# Patient Record
Sex: Female | Born: 1970
Health system: Southern US, Community
[De-identification: ages and names within clinical notes are randomized; demographics above are authoritative.]

## PROBLEM LIST (undated history)

## (undated) DIAGNOSIS — F988 Other specified behavioral and emotional disorders with onset usually occurring in childhood and adolescence: Secondary | ICD-10-CM

## (undated) DIAGNOSIS — E785 Hyperlipidemia, unspecified: Secondary | ICD-10-CM

## (undated) DIAGNOSIS — R519 Headache, unspecified: Secondary | ICD-10-CM

## (undated) DIAGNOSIS — K648 Other hemorrhoids: Secondary | ICD-10-CM

## (undated) DIAGNOSIS — K760 Fatty (change of) liver, not elsewhere classified: Secondary | ICD-10-CM

## (undated) DIAGNOSIS — F329 Major depressive disorder, single episode, unspecified: Secondary | ICD-10-CM

## (undated) DIAGNOSIS — R609 Edema, unspecified: Secondary | ICD-10-CM

## (undated) DIAGNOSIS — E559 Vitamin D deficiency, unspecified: Secondary | ICD-10-CM

## (undated) DIAGNOSIS — D259 Leiomyoma of uterus, unspecified: Secondary | ICD-10-CM

## (undated) DIAGNOSIS — I10 Essential (primary) hypertension: Secondary | ICD-10-CM

## (undated) DIAGNOSIS — R7989 Other specified abnormal findings of blood chemistry: Secondary | ICD-10-CM

## (undated) DIAGNOSIS — G8929 Other chronic pain: Secondary | ICD-10-CM

## (undated) DIAGNOSIS — J189 Pneumonia, unspecified organism: Secondary | ICD-10-CM

## (undated) DIAGNOSIS — N920 Excessive and frequent menstruation with regular cycle: Secondary | ICD-10-CM

## (undated) DIAGNOSIS — E041 Nontoxic single thyroid nodule: Secondary | ICD-10-CM

## (undated) DIAGNOSIS — F32A Depression, unspecified: Secondary | ICD-10-CM

## (undated) DIAGNOSIS — K219 Gastro-esophageal reflux disease without esophagitis: Secondary | ICD-10-CM

## (undated) DIAGNOSIS — F419 Anxiety disorder, unspecified: Secondary | ICD-10-CM

## (undated) DIAGNOSIS — E039 Hypothyroidism, unspecified: Secondary | ICD-10-CM

## (undated) DIAGNOSIS — M1990 Unspecified osteoarthritis, unspecified site: Secondary | ICD-10-CM

## (undated) DIAGNOSIS — R011 Cardiac murmur, unspecified: Secondary | ICD-10-CM

## (undated) DIAGNOSIS — N939 Abnormal uterine and vaginal bleeding, unspecified: Secondary | ICD-10-CM

## (undated) DIAGNOSIS — G4730 Sleep apnea, unspecified: Secondary | ICD-10-CM

## (undated) DIAGNOSIS — Z9289 Personal history of other medical treatment: Secondary | ICD-10-CM

## (undated) DIAGNOSIS — R51 Headache: Secondary | ICD-10-CM

## (undated) DIAGNOSIS — D509 Iron deficiency anemia, unspecified: Secondary | ICD-10-CM

## (undated) DIAGNOSIS — M2550 Pain in unspecified joint: Secondary | ICD-10-CM

## (undated) DIAGNOSIS — K222 Esophageal obstruction: Secondary | ICD-10-CM

## (undated) DIAGNOSIS — U071 COVID-19: Secondary | ICD-10-CM

## (undated) DIAGNOSIS — F418 Other specified anxiety disorders: Secondary | ICD-10-CM

## (undated) HISTORY — DX: Other specified anxiety disorders: F41.8

## (undated) HISTORY — PX: WISDOM TOOTH EXTRACTION: SHX21

## (undated) HISTORY — DX: Iron deficiency anemia, unspecified: D50.9

## (undated) HISTORY — DX: Depression, unspecified: F32.A

## (undated) HISTORY — DX: Cardiac murmur, unspecified: R01.1

## (undated) HISTORY — DX: Other specified abnormal findings of blood chemistry: R79.89

## (undated) HISTORY — DX: Leiomyoma of uterus, unspecified: D25.9

## (undated) HISTORY — DX: Unspecified osteoarthritis, unspecified site: M19.90

## (undated) HISTORY — DX: Essential (primary) hypertension: I10

## (undated) HISTORY — DX: Headache: R51

## (undated) HISTORY — DX: Anxiety disorder, unspecified: F41.9

## (undated) HISTORY — DX: Hyperlipidemia, unspecified: E78.5

## (undated) HISTORY — DX: Sleep apnea, unspecified: G47.30

## (undated) HISTORY — DX: Headache, unspecified: R51.9

## (undated) HISTORY — DX: Other hemorrhoids: K64.8

## (undated) HISTORY — DX: Other specified behavioral and emotional disorders with onset usually occurring in childhood and adolescence: F98.8

## (undated) HISTORY — DX: Excessive and frequent menstruation with regular cycle: N92.0

## (undated) HISTORY — DX: Abnormal uterine and vaginal bleeding, unspecified: N93.9

## (undated) HISTORY — DX: Edema, unspecified: R60.9

## (undated) HISTORY — DX: Esophageal obstruction: K22.2

## (undated) HISTORY — DX: Gastro-esophageal reflux disease without esophagitis: K21.9

## (undated) HISTORY — DX: Vitamin D deficiency, unspecified: E55.9

## (undated) HISTORY — DX: Pain in unspecified joint: M25.50

## (undated) HISTORY — DX: Major depressive disorder, single episode, unspecified: F32.9

## (undated) HISTORY — PX: JOINT REPLACEMENT: SHX530

## (undated) HISTORY — DX: Hypothyroidism, unspecified: E03.9

## (undated) HISTORY — PX: TUBAL LIGATION: SHX77

## (undated) HISTORY — DX: Nontoxic single thyroid nodule: E04.1

## (undated) HISTORY — DX: Fatty (change of) liver, not elsewhere classified: K76.0

## (undated) HISTORY — DX: Other chronic pain: G89.29

---

## 1999-02-13 ENCOUNTER — Ambulatory Visit (HOSPITAL_COMMUNITY): Admission: RE | Admit: 1999-02-13 | Discharge: 1999-02-13 | Payer: Self-pay | Admitting: *Deleted

## 2006-03-04 ENCOUNTER — Encounter: Admission: RE | Admit: 2006-03-04 | Discharge: 2006-03-04 | Payer: Self-pay | Admitting: Internal Medicine

## 2010-11-04 ENCOUNTER — Encounter: Admission: RE | Admit: 2010-11-04 | Discharge: 2010-11-04 | Payer: Self-pay | Admitting: Family Medicine

## 2011-12-21 ENCOUNTER — Other Ambulatory Visit: Payer: Self-pay | Admitting: Family Medicine

## 2011-12-21 DIAGNOSIS — Z1231 Encounter for screening mammogram for malignant neoplasm of breast: Secondary | ICD-10-CM

## 2011-12-30 ENCOUNTER — Ambulatory Visit (INDEPENDENT_AMBULATORY_CARE_PROVIDER_SITE_OTHER): Payer: 59 | Admitting: Medical

## 2011-12-30 ENCOUNTER — Encounter: Payer: Self-pay | Admitting: Medical

## 2011-12-30 VITALS — BP 132/80 | HR 78 | Temp 98.3°F | Resp 16 | Ht 66.0 in | Wt 182.0 lb

## 2011-12-30 DIAGNOSIS — M79609 Pain in unspecified limb: Secondary | ICD-10-CM | POA: Insufficient documentation

## 2011-12-30 DIAGNOSIS — F341 Dysthymic disorder: Secondary | ICD-10-CM

## 2011-12-30 DIAGNOSIS — K117 Disturbances of salivary secretion: Secondary | ICD-10-CM

## 2011-12-30 DIAGNOSIS — F418 Other specified anxiety disorders: Secondary | ICD-10-CM

## 2011-12-30 DIAGNOSIS — H04123 Dry eye syndrome of bilateral lacrimal glands: Secondary | ICD-10-CM | POA: Insufficient documentation

## 2011-12-30 DIAGNOSIS — R682 Dry mouth, unspecified: Secondary | ICD-10-CM | POA: Insufficient documentation

## 2011-12-30 DIAGNOSIS — E039 Hypothyroidism, unspecified: Secondary | ICD-10-CM

## 2011-12-30 DIAGNOSIS — H04129 Dry eye syndrome of unspecified lacrimal gland: Secondary | ICD-10-CM

## 2011-12-30 HISTORY — DX: Other specified anxiety disorders: F41.8

## 2011-12-30 MED ORDER — LEVOTHYROXINE SODIUM 175 MCG PO TABS: 175.0000 ug | ORAL_TABLET | Freq: Every day | ORAL | Status: DC

## 2011-12-30 MED ORDER — BUPROPION HCL ER (XL) 300 MG PO TB24: 300.0000 mg | ORAL_TABLET | Freq: Every day | ORAL | Status: DC

## 2011-12-30 NOTE — Patient Instructions (Signed)
Return or call in 2 weeks.

## 2011-12-30 NOTE — Progress Notes (Signed)
Subjective:   HPI  [name] is a 41 y.o. female who presents as a new patient today. She was formally seen [facility] in [place], [place].    She is here to establish care. She needs refills on her thyroid and depression medication, both of which she has been on for a while now and does well these medications. She has a history of ADD, was on Adderall at [facility] prior, but took herself off of these as she did not like the way they made her feel.    Her last routine care includes physical March 2012, mammogram 2011 Pap smear 2011, she is up-to-date on flu vaccine, is exercising 7 days a week 45 minutes per day with walking. She has 2 prior pregnancies with childbirth in 1994 and 1997.    Her other main concern today is dry mouth, dry eyes, and pains in her arms. She saw Dr. Smith Mince in December at the [facility] clinic for these complaints, had a panel of labs done which apparently were normal. Her dentist had commented about the possibility of Sjogren's, and this is what they were trying to rule out with labs.  She has had problems with dry mouth and dry eyes since Thanksgiving 2012. She has had intermittent aches in her arms bilaterally for a few months now. Her arms go numb at night, but not a problem during the day.  She denies morning stiffness. She does feel like her hands and fingers swell at times. Her mouth has a dryness and burning sensation at times.  She denies vaginal dryness.  She does report fatigue. Normally has pains in her shoulders, wrists and fingers. She does note a history of neck pain, was [facility] in the past, and does remember mention of stenosis.  She has a cousin with a history of Sjogren's and rheumatoid arthritis.  No other aggravating or relieving factors.    No other c/o.  The following portions of the patient's history were reviewed and updated as appropriate: allergies, current medications, past family history, past medical history,  past social history, past surgical history and problem list.  Past Medical History  Diagnosis Date  . Edema   . Hypothyroidism   . Allergy   . History of pneumonia   . Iron deficiency anemia   . Chronic headache   . Depression   . Anxiety   . Joint pain   . ADD (attention deficit disorder)    Review of Systems Constitutional: -fever, -chills, -sweats, -unexpected -weight change, +fatigue ENT: -runny nose, -ear pain, -sore throat Cardiology:  -chest pain, -palpitations, -edema Respiratory: -cough, -shortness of breath, -wheezing Gastroenterology: -abdominal pain, -nausea, -vomiting, -diarrhea, -constipation  Hematology: -bleeding or bruising problems Ophthalmology: -vision changes Urology: -dysuria, -difficulty urinating, -hematuria, -urinary frequency, -urgency Neurology: -headache, -weakness, -tingling   Objective:   Physical Exam  Filed Vitals:   12/30/11 1556  BP: 132/80  Pulse: 78  Temp: 98.3 F (36.8 C)  Resp: 16    General appearance: alert, no distress, WD/WN, white female Skin no rash, no tightening of the skin HEENT: normocephalic, sclerae anicteric, TMs pearly, nares patent, no discharge or erythema, pharynx normal Oral cavity: MMM, no lesions Neck: supple, no lymphadenopathy, no thyromegaly, no masses Heart: RRR, normal S1, S2, no murmurs Lungs: CTA bilaterally, no wheezes, rhonchi, or rales Pulses: 2+ symmetric, upper and lower extremities, normal cap refill Musculoskeletal upper extremities non-tender, no bony abnormality, no swelling Neuro: Mild positive Phalen's, negative Tinel's, otherwise  upper extremities with normal strength and sensation, normal DTRs  Assessment and Plan :    Encounter Diagnoses  Name Primary?  . Dry mouth Yes  . Dry eyes   . Pain in limb   . Depression with anxiety   . Hypothyroidism    Dry mouth and dry eyes-she will sign for records release so we can obtain prior labs and records from [facility]  Pain in  limb-advise she roll her arms in a towel to keep her arms relatively straight at night, sleep flat on her back, with pillows supporting arms, and less see if this helps the numbness.  Depression and anxiety-refill her Wellbutrin  Hypothyroidism-refer her thyroid medication  Follow-up 2 weeks pending records and labs

## 2012-01-06 ENCOUNTER — Ambulatory Visit
Admission: RE | Admit: 2012-01-06 | Discharge: 2012-01-06 | Disposition: A | Discharge status: Home / Self Care | Payer: 59 | Source: Ambulatory Visit | Attending: Family Medicine | Admitting: Family Medicine

## 2012-01-06 DIAGNOSIS — Z1231 Encounter for screening mammogram for malignant neoplasm of breast: Secondary | ICD-10-CM

## 2012-02-18 ENCOUNTER — Ambulatory Visit (INDEPENDENT_AMBULATORY_CARE_PROVIDER_SITE_OTHER): Payer: 59 | Admitting: Medical

## 2012-02-18 ENCOUNTER — Encounter: Payer: Self-pay | Admitting: Medical

## 2012-02-18 VITALS — BP 132/82 | HR 76 | Temp 98.4°F | Resp 16 | Wt 183.0 lb

## 2012-02-18 DIAGNOSIS — F329 Major depressive disorder, single episode, unspecified: Secondary | ICD-10-CM | POA: Insufficient documentation

## 2012-02-18 DIAGNOSIS — F988 Other specified behavioral and emotional disorders with onset usually occurring in childhood and adolescence: Secondary | ICD-10-CM

## 2012-02-18 DIAGNOSIS — F32A Depression, unspecified: Secondary | ICD-10-CM | POA: Insufficient documentation

## 2012-02-18 MED ORDER — AMPHETAMINE-DEXTROAMPHET ER 20 MG PO CP24: 20.0000 mg | ORAL_CAPSULE | ORAL | Status: DC
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
## 2012-02-18 NOTE — Patient Instructions (Signed)

## 2012-02-29 ENCOUNTER — Telehealth: Payer: Self-pay | Admitting: Medical

## 2012-03-03 ENCOUNTER — Telehealth: Payer: Self-pay | Admitting: Medical

## 2012-03-03 ENCOUNTER — Telehealth: Payer: Self-pay | Admitting: Family Medicine

## 2012-03-03 NOTE — Telephone Encounter (Signed)
DONE

## 2012-03-04 ENCOUNTER — Telehealth: Payer: Self-pay | Admitting: Family Medicine

## 2012-03-04 ENCOUNTER — Encounter: Payer: Self-pay | Admitting: Family Medicine

## 2012-03-04 NOTE — Telephone Encounter (Signed)
Pt called she needs a letter stating she is on Adderall and it may increase her blood pressure.  She needs this for her Bio Metric screening to save her 40.00 per month.  Will send copy to [facility] in [place] and cc pt. Per pt

## 2012-03-18 ENCOUNTER — Encounter: Payer: Self-pay | Admitting: Internal Medicine

## 2012-03-23 ENCOUNTER — Ambulatory Visit (INDEPENDENT_AMBULATORY_CARE_PROVIDER_SITE_OTHER): Payer: 59 | Admitting: Medical

## 2012-03-23 ENCOUNTER — Encounter: Payer: Self-pay | Admitting: Medical

## 2012-03-23 VITALS — BP 140/80 | HR 80 | Temp 98.4°F | Resp 16 | Wt 181.0 lb

## 2012-03-23 DIAGNOSIS — F329 Major depressive disorder, single episode, unspecified: Secondary | ICD-10-CM

## 2012-03-23 DIAGNOSIS — F988 Other specified behavioral and emotional disorders with onset usually occurring in childhood and adolescence: Secondary | ICD-10-CM

## 2012-03-23 MED ORDER — CITALOPRAM HYDROBROMIDE 20 MG PO TABS: ORAL_TABLET | ORAL | Status: DC

## 2012-03-23 MED ORDER — AMPHETAMINE-DEXTROAMPHET ER 20 MG PO CP24: 20.0000 mg | ORAL_CAPSULE | ORAL | Status: DC
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
## 2012-05-05 ENCOUNTER — Other Ambulatory Visit: Payer: Self-pay | Admitting: Medical

## 2012-05-05 ENCOUNTER — Telehealth: Payer: Self-pay | Admitting: Medical

## 2012-05-05 MED ORDER — AMPHETAMINE-DEXTROAMPHET ER 20 MG PO CP24: 20.0000 mg | ORAL_CAPSULE | ORAL | Status: DC

## 2012-05-05 NOTE — Telephone Encounter (Signed)
rx ready 

## 2012-05-25 ENCOUNTER — Encounter: Payer: Self-pay | Admitting: Medical

## 2012-05-25 ENCOUNTER — Ambulatory Visit (INDEPENDENT_AMBULATORY_CARE_PROVIDER_SITE_OTHER): Payer: 59 | Admitting: Medical

## 2012-05-25 VITALS — BP 126/82 | HR 72 | Temp 98.1°F | Resp 16 | Wt 181.0 lb

## 2012-05-25 DIAGNOSIS — M542 Cervicalgia: Secondary | ICD-10-CM

## 2012-05-25 DIAGNOSIS — M5412 Radiculopathy, cervical region: Secondary | ICD-10-CM

## 2012-05-25 MED ORDER — HYDROCODONE-ACETAMINOPHEN 5-500 MG PO TABS: 1.0000 | ORAL_TABLET | Freq: Four times a day (QID) | ORAL | Status: AC | PRN

## 2012-05-25 MED ORDER — METHYLPREDNISOLONE SODIUM SUCC 125 MG IJ SOLR
125.0000 mg | Freq: Once | INTRAMUSCULAR | Status: AC
  Administered 2012-05-25: 125 mg via INTRAMUSCULAR

## 2012-05-25 MED ORDER — METHYLPREDNISOLONE SODIUM SUCC 125 MG IJ SOLR: 125.0000 mg | Freq: Once | INTRAMUSCULAR | Status: DC

## 2012-05-25 MED ORDER — METAXALONE 800 MG PO TABS: 800.0000 mg | ORAL_TABLET | Freq: Three times a day (TID) | ORAL | Status: AC

## 2012-05-25 NOTE — Progress Notes (Signed)
Subjective: Here for c/o neck pain.  She reports neck pain since age 41yo.  Gets flare ups of neck pain intermittent.  This past Saturday had really bad flare up, still has neck pain and headache now almost 5 days later.  Bending neck worsens the pain.  Using Advil 800mg  OTC, muscle relaxer prn.  She was told in the remote past that she has a pinched nerve. Has seen chiropractic before.  [facility].  No prior MRI or EDSI.  She notes pain in neck posteriorly, pain usually radiates to left upper back and shoulder, down the left arm sometimes.  Denies numbness, tingling, weakness.  Denies back pain.  She notes that she gets spasms in left upper back at times too that seems different than the neck pain.    Past Medical History  Diagnosis Date  . Edema   . Hypothyroidism   . Allergy   . History of pneumonia   . Iron deficiency anemia   . Chronic headache   . Depression   . Anxiety   . Joint pain   . ADD (attention deficit disorder)    Objective: Gen: WD, WN, NAD Skin: no erythema or ecchymosis Neck: supple, nontender, but ROM causes pain, no mass or thyromegaly MSK: nontender UE, back, normal ROM of UE, no obvious deformity Neuro: normal UE strength, sensation, and DTRs Pulses UE normal  Assessment: Encounter Diagnoses  Name Primary?  . Neck pain Yes  . Cervical radicular pain     Plan:  reviewed prior spine center notes. She seems to have both upper back/neck spasm as well as radicular symptoms of cervical spine.  Solumedrol IM today.  Script for Hydrocodone prn, Flexeril, and can use OTC NSAID prn.  Discussed home exercise program, stretching, and if pain continues to worsen or other new radicular symptoms, consider MRI and/or referral back to spine center.

## 2012-05-26 ENCOUNTER — Encounter: Payer: Self-pay | Admitting: Medical

## 2012-09-07 ENCOUNTER — Ambulatory Visit (INDEPENDENT_AMBULATORY_CARE_PROVIDER_SITE_OTHER): Payer: 59 | Admitting: Medical

## 2012-09-07 ENCOUNTER — Encounter: Payer: Self-pay | Admitting: Medical

## 2012-09-07 VITALS — BP 120/80 | Temp 98.6°F | Wt 176.0 lb

## 2012-09-07 DIAGNOSIS — E039 Hypothyroidism, unspecified: Secondary | ICD-10-CM

## 2012-09-07 DIAGNOSIS — F418 Other specified anxiety disorders: Secondary | ICD-10-CM

## 2012-09-07 DIAGNOSIS — F341 Dysthymic disorder: Secondary | ICD-10-CM

## 2012-09-07 DIAGNOSIS — F988 Other specified behavioral and emotional disorders with onset usually occurring in childhood and adolescence: Secondary | ICD-10-CM

## 2012-09-07 DIAGNOSIS — E041 Nontoxic single thyroid nodule: Secondary | ICD-10-CM

## 2012-09-07 MED ORDER — AMPHETAMINE-DEXTROAMPHET ER 20 MG PO CP24: 20.0000 mg | ORAL_CAPSULE | ORAL | Status: DC

## 2012-09-07 MED ORDER — BUPROPION HCL ER (XL) 300 MG PO TB24: 300.0000 mg | ORAL_TABLET | Freq: Every day | ORAL | Status: DC

## 2012-09-07 NOTE — Progress Notes (Signed)
  Subjective:   HPI  [name] is a 41 y.o. female who presents for routine f/u on hypothyroidism, ADD, and depression.  Been on synthroid without c/o.    Uses Adderral XR 20mg  daily for focus and help with attention.  No c/o.    Since last visit has had improvement in social situation.  receiving child support from ex husband now.  Her youngest is living with her full time and in new school which is an improvement.  She isn't working part time at [employer] since the season is open.    No other c/o.  The following portions of the patient's history were reviewed and updated as appropriate: allergies, current medications, past family history, past medical history, past social history, past surgical history and problem list.  Past Medical History  Diagnosis Date  . Edema   . Hypothyroidism   . Allergy   . History of pneumonia   . Iron deficiency anemia   . Chronic headache   . Depression   . Anxiety   . Joint pain   . ADD (attention deficit disorder)     No Known Allergies   Review of Systems ROS reviewed and was negative other than noted in HPI or above.    Objective:   Physical Exam  General appearance: alert, no distress, WD/WN Neck: supple, no lymphadenopathy, left lower thyroid with 1cm round nontender nodule, otherwise no thyromegaly Heart: RRR, normal S1, S2, no murmurs Lungs: CTA bilaterally, no wheezes, rhonchi, or rales Pulses: 2+ symmetric, upper and lower extremities, normal cap refill   Assessment and Plan :     Encounter Diagnoses  Name Primary?  . Hypothyroidism Yes  . Thyroid nodule   . Depression with anxiety   . ADD (attention deficit disorder)    Hypothyroidism - labs today, samples of Synthroid given.  F/u pending labs  Thyroid nodule noted on exam today.  Will send for thyroid ultrasound for further evaluation  Depression with anxiety - doing well on Wellbutrin 300mg  XL daily.  Did not tolerate celexa.   Refills today  ADD - doing well on Adderall XR 20mg  daily.  Scripts written.  C/t same.  F/u pending ultrasound, labs  Is due for complete physical soon.

## 2012-09-08 LAB — T4, FREE: Free T4: 1.14 ng/dL (ref 0.80–1.80)

## 2012-09-09 ENCOUNTER — Telehealth: Payer: Self-pay | Admitting: Family Medicine

## 2012-09-09 NOTE — Telephone Encounter (Signed)
PATIENT IS AWARE OF HER APPOINTMENT AT [facility] ON 09/14/12 @ 815 AM. CLS

## 2012-09-09 NOTE — Telephone Encounter (Signed)
Message copied by Janeice Robinson on Fri Sep 09, 2012  3:57 PM ------      Message from: Aleen Campi, DAVID S      Created: Wed Sep 07, 2012  6:59 PM       Set up thyroid ultrasound at [facility] for new palpated thyroid nodule, 1cm round on the left.

## 2012-09-14 ENCOUNTER — Ambulatory Visit (HOSPITAL_COMMUNITY)
Admission: RE | Admit: 2012-09-14 | Discharge: 2012-09-14 | Disposition: A | Discharge status: Home / Self Care | Payer: 59 | Source: Ambulatory Visit | Attending: Medical | Admitting: Medical

## 2012-09-14 DIAGNOSIS — E041 Nontoxic single thyroid nodule: Secondary | ICD-10-CM | POA: Insufficient documentation

## 2012-11-29 ENCOUNTER — Telehealth: Payer: Self-pay | Admitting: Medical

## 2012-11-29 NOTE — Telephone Encounter (Signed)
See if we have samples, and if so, make available for patient

## 2012-11-30 ENCOUNTER — Other Ambulatory Visit: Payer: Self-pay | Admitting: Family Medicine

## 2012-11-30 MED ORDER — LEVOTHYROXINE SODIUM 175 MCG PO TABS: 175.0000 ug | ORAL_TABLET | Freq: Every day | ORAL | Status: DC

## 2012-11-30 NOTE — Telephone Encounter (Signed)
I LEFT MESSAGE ON VOICEMAIL THAT THE MEDICATION SAMPLES ARE READY. CLS

## 2012-12-19 ENCOUNTER — Other Ambulatory Visit: Payer: Self-pay | Admitting: Medical

## 2012-12-28 ENCOUNTER — Ambulatory Visit (INDEPENDENT_AMBULATORY_CARE_PROVIDER_SITE_OTHER): Payer: 59 | Admitting: Medical

## 2012-12-28 ENCOUNTER — Encounter: Payer: Self-pay | Admitting: Medical

## 2012-12-28 VITALS — BP 130/82 | HR 60 | Temp 98.1°F | Resp 16

## 2012-12-28 DIAGNOSIS — F988 Other specified behavioral and emotional disorders with onset usually occurring in childhood and adolescence: Secondary | ICD-10-CM

## 2012-12-28 DIAGNOSIS — F3289 Other specified depressive episodes: Secondary | ICD-10-CM

## 2012-12-28 DIAGNOSIS — F329 Major depressive disorder, single episode, unspecified: Secondary | ICD-10-CM

## 2012-12-28 MED ORDER — AMPHETAMINE-DEXTROAMPHET ER 25 MG PO CP24: 25.0000 mg | ORAL_CAPSULE | ORAL | Status: DC

## 2012-12-28 NOTE — Progress Notes (Signed)
Subjective: Here for recheck on mood, depression, ADD, medication.  Last visit here 08/2012. Mood been ok, no recent stressors out of the ordinary.  For the most part work has been going ok, home life going ok.  Wellbutrin XL 300mg  seems to be fine.  She does report issues with focus and attention.  In the past was on Adderalll XR 30mg  daily.  currently on 20mg  daily and feels like it isn't working very well. Feels scattered some times, has difficulty with the task at hand, getting off track.  No other new issues.    Objective: General appearance: alert, no distress, WD/WN  Neck: supple, no lymphadenopathy, left lower thyroid with 1cm round nontender nodule, otherwise no thyromegaly  Heart: RRR, normal S1, S2, no murmurs  Lungs: CTA bilaterally, no wheezes, rhonchi, or rales  Pulses: 2+ symmetric, upper and lower extremities, normal cap refill  Assessment: Encounter Diagnoses  Name Primary?  . ADD (attention deficit disorder) Yes  . Depression    Plan: ADD - increase to Adderall XR 25mg  daily.  Consider taking closer to 8am in the mornings instead of 6:30am.  Recheck 42mo.  Depression - doing well on current medication, Wellbutrin XL 300mg  daily.  F/u in 42mo for physical, labs

## 2013-01-23 ENCOUNTER — Encounter: Payer: 59 | Admitting: Medical

## 2013-01-28 ENCOUNTER — Other Ambulatory Visit: Payer: Self-pay

## 2013-03-24 ENCOUNTER — Telehealth: Payer: Self-pay | Admitting: Medical

## 2013-03-24 NOTE — Telephone Encounter (Signed)
Needs refill on generic adderral, please call when ready, if ready today, otherwise she will pick up on Monday when she brings her son in

## 2013-03-27 ENCOUNTER — Other Ambulatory Visit: Payer: Self-pay | Admitting: Medical

## 2013-03-27 MED ORDER — AMPHETAMINE-DEXTROAMPHET ER 25 MG PO CP24: 25.0000 mg | ORAL_CAPSULE | ORAL | Status: DC

## 2013-03-27 NOTE — Telephone Encounter (Signed)
rx ready 

## 2013-03-27 NOTE — Telephone Encounter (Signed)
Patient pick up her Rx. CLS

## 2013-06-09 ENCOUNTER — Encounter: Payer: Self-pay | Admitting: Internal Medicine

## 2013-06-09 ENCOUNTER — Other Ambulatory Visit: Payer: Self-pay | Admitting: Medical

## 2013-06-09 ENCOUNTER — Telehealth: Payer: Self-pay | Admitting: Internal Medicine

## 2013-06-09 MED ORDER — AMPHETAMINE-DEXTROAMPHET ER 25 MG PO CP24: 25.0000 mg | ORAL_CAPSULE | ORAL | Status: DC

## 2013-06-09 NOTE — Telephone Encounter (Signed)
Mailed to pt and als wrote a note stating she needed to come in for a physical or medication check to call office and schedule

## 2013-06-09 NOTE — Telephone Encounter (Signed)
Pt needs a refill on her adderall. Please mail rx to patient as listed

## 2013-06-09 NOTE — Telephone Encounter (Signed)
I think this one is yours

## 2013-07-24 ENCOUNTER — Telehealth: Payer: Self-pay | Admitting: Medical

## 2013-07-25 ENCOUNTER — Other Ambulatory Visit: Payer: Self-pay | Admitting: Family Medicine

## 2013-07-25 MED ORDER — LEVOTHYROXINE SODIUM 175 MCG PO TABS: 175.0000 ug | ORAL_TABLET | Freq: Every day | ORAL | Status: DC

## 2013-07-25 NOTE — Telephone Encounter (Signed)
Medication was sent to her pharmacy for only 30 days until she comes in for an appointment. CLS

## 2013-08-22 ENCOUNTER — Ambulatory Visit (INDEPENDENT_AMBULATORY_CARE_PROVIDER_SITE_OTHER): Payer: 59 | Admitting: Medical

## 2013-08-22 ENCOUNTER — Encounter: Payer: Self-pay | Admitting: Medical

## 2013-08-22 VITALS — BP 122/80 | HR 72 | Temp 98.4°F | Resp 16 | Wt 182.0 lb

## 2013-08-22 DIAGNOSIS — F988 Other specified behavioral and emotional disorders with onset usually occurring in childhood and adolescence: Secondary | ICD-10-CM

## 2013-08-22 DIAGNOSIS — M79609 Pain in unspecified limb: Secondary | ICD-10-CM

## 2013-08-22 DIAGNOSIS — M79671 Pain in right foot: Secondary | ICD-10-CM

## 2013-08-22 DIAGNOSIS — E039 Hypothyroidism, unspecified: Secondary | ICD-10-CM

## 2013-08-22 DIAGNOSIS — E041 Nontoxic single thyroid nodule: Secondary | ICD-10-CM

## 2013-08-22 DIAGNOSIS — D649 Anemia, unspecified: Secondary | ICD-10-CM

## 2013-08-22 DIAGNOSIS — E559 Vitamin D deficiency, unspecified: Secondary | ICD-10-CM

## 2013-08-22 LAB — CBC WITH DIFFERENTIAL/PLATELET
Basophils Absolute: 0 10*3/uL (ref 0.0–0.1)
Eosinophils Relative: 2 % (ref 0–5)
Lymphocytes Relative: 22 % (ref 12–46)
Lymphs Abs: 1.8 10*3/uL (ref 0.7–4.0)
MCV: 76.7 fL — ABNORMAL LOW (ref 78.0–100.0)
Neutro Abs: 5.5 10*3/uL (ref 1.7–7.7)
Neutrophils Relative %: 68 % (ref 43–77)
Platelets: 370 10*3/uL (ref 150–400)
RBC: 4.38 MIL/uL (ref 3.87–5.11)
RDW: 16.8 % — ABNORMAL HIGH (ref 11.5–15.5)
WBC: 8 10*3/uL (ref 4.0–10.5)

## 2013-08-22 LAB — COMPREHENSIVE METABOLIC PANEL
ALT: 15 U/L (ref 0–35)
Albumin: 4.4 g/dL (ref 3.5–5.2)
CO2: 24 mEq/L (ref 19–32)
Calcium: 8.9 mg/dL (ref 8.4–10.5)
Chloride: 107 mEq/L (ref 96–112)
Glucose, Bld: 90 mg/dL (ref 70–99)
Sodium: 138 mEq/L (ref 135–145)
Total Bilirubin: 0.2 mg/dL — ABNORMAL LOW (ref 0.3–1.2)
Total Protein: 6.9 g/dL (ref 6.0–8.3)

## 2013-08-22 LAB — TSH: TSH: 0.403 u[IU]/mL (ref 0.350–4.500)

## 2013-08-22 LAB — T4, FREE: Free T4: 1.34 ng/dL (ref 0.80–1.80)

## 2013-08-22 LAB — T3: T3, Total: 87.4 ng/dL (ref 80.0–204.0)

## 2013-08-22 MED ORDER — AMPHETAMINE-DEXTROAMPHET ER 25 MG PO CP24: 25.0000 mg | ORAL_CAPSULE | ORAL | Status: DC

## 2013-08-22 NOTE — Progress Notes (Signed)
Subjective: Here for med check.  Hypothyroidism - compliant with medication, last labs over a year ago.  No issues currently, but she can tell something is off when she has been out of medication in the past.  Having some intermittent bilat foot pain in soles.  She did by new arch supports/inserts few weeks ago , and so far pain is improved.  No particularly activity worsens. When painful, can be any time of day.  Recheck on ADD.  No current problems, medication working well to help with focus.  She recently changed jobs.  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  Is really happy with the new position, her coworkers, and the overall work environment is pleasant.    Hx/o thyroid nodule.  We felt this last year and she had ultrasound.  She is curious about f/u on this.   Last general labs >1 year ago.   She is not fasting today, but last meal over 2 hours ago.   Past Medical History  Diagnosis Date  . Edema   . Hypothyroidism   . Allergy   . History of pneumonia   . Iron deficiency anemia   . Chronic headache   . Depression   . Anxiety   . Joint pain   . ADD (attention deficit disorder)   . Thyroid nodule     ultrasound 10/13  . Heavy periods   . Vitamin D deficiency    ROS as in subjective  Objective: General appearance: alert, no distress, WD/WN  Neck: supple, no lymphadenopathy, left lower thyroid with 1cm round nontender nodule, otherwise no thyromegaly  Heart: RRR, normal S1, S2, no murmurs  Lungs: CTA bilaterally, no wheezes, rhonchi, or rales  Pulses: 2+ symmetric, upper and lower extremities, normal cap refill Abdomen: +bs, soft, nontender, no mass, no organomegaly Feet nontender, no obvious deformity, good pulses, normal sensation and strength, normal ROM Ext: no edema  Assessment: Encounter Diagnoses  Name Primary?  Marland Kitchen Unspecified hypothyroidism Yes  . Thyroid nodule   . ADD (attention  deficit disorder)   . Anemia   . Unspecified vitamin D deficiency   . Foot pain, bilateral      Plan: Hypothyroidism - labs today, compliant with medication Thyroid nodule - I will call radiology to inquire about whether we should repeat scan with ultrasound vs nuclear scan.  Last ultrasound 10/13.   ADD - doing well on Adderall XR 25mg  daily.  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  She seems to be relieved and happier. Anemia - heavy periods.  Repeat labs today. Vit D deficiency - labs today.  Currently she is not taking Vit D. Foot pain - footwear issue vs plantar fascitis.  Discussed her current footwear.  She is seeing improvements since using arch supports/inserts.  She will try morning towel stretch and tennis ball massage.  Ice water bottle massage prn. F/u pending labs, and she is due at this time for pap.  She is aware of this.   She is up to date on mammogram from 2013, and had normal lipid panel 2012.

## 2013-08-23 ENCOUNTER — Other Ambulatory Visit: Payer: Self-pay | Admitting: Medical

## 2013-08-23 LAB — VITAMIN D 25 HYDROXY (VIT D DEFICIENCY, FRACTURES): Vit D, 25-Hydroxy: 37 ng/mL (ref 30–89)

## 2013-08-23 MED ORDER — LEVOTHYROXINE SODIUM 175 MCG PO TABS: 175.0000 ug | ORAL_TABLET | Freq: Every day | ORAL | Status: DC

## 2013-08-23 MED ORDER — FERROUS GLUCONATE 324 (38 FE) MG PO TABS: 324.0000 mg | ORAL_TABLET | Freq: Two times a day (BID) | ORAL | Status: DC

## 2013-09-10 ENCOUNTER — Other Ambulatory Visit: Payer: Self-pay | Admitting: Medical

## 2013-09-21 ENCOUNTER — Encounter: Payer: Self-pay | Admitting: Podiatry

## 2013-09-21 ENCOUNTER — Ambulatory Visit (INDEPENDENT_AMBULATORY_CARE_PROVIDER_SITE_OTHER): Payer: 59 | Admitting: Podiatry

## 2013-09-21 ENCOUNTER — Ambulatory Visit (INDEPENDENT_AMBULATORY_CARE_PROVIDER_SITE_OTHER): Payer: 59

## 2013-09-21 VITALS — BP 147/99 | HR 74 | Resp 12 | Ht 66.0 in | Wt 183.0 lb

## 2013-09-21 DIAGNOSIS — M722 Plantar fascial fibromatosis: Secondary | ICD-10-CM

## 2013-09-21 DIAGNOSIS — M79609 Pain in unspecified limb: Secondary | ICD-10-CM

## 2013-09-21 DIAGNOSIS — M7750 Other enthesopathy of unspecified foot: Secondary | ICD-10-CM

## 2013-09-21 DIAGNOSIS — M79673 Pain in unspecified foot: Secondary | ICD-10-CM

## 2013-09-21 MED ORDER — PREDNISONE 10 MG PO KIT: PACK | ORAL | Status: DC

## 2013-09-21 NOTE — Patient Instructions (Signed)
We will call you when orthotics are back

## 2013-09-21 NOTE — Progress Notes (Signed)
N SORE, BURNING L   B/L FOOT D   3 MONTHS O   SLOWLY C    WORSE A     PRESSURE T     ICING

## 2013-09-22 NOTE — Progress Notes (Signed)
Subjective:     Patient ID: [name], female   DOB: Jul 23, 1971, 42 y.o.   MRN: [id number]  Foot Pain   patient presents that both of her feet are painful and burning and that this is been going on now for 3 months. Not remember specific incident which led to this but it has gradually gotten worse over time.   Review of Systems  All other systems reviewed and are negative.       Objective:   Physical Exam  Nursing note and vitals reviewed. Constitutional: She appears well-nourished.  Cardiovascular: Intact distal pulses.   Musculoskeletal: Normal range of motion.  Neurological: She is alert. She has normal reflexes.  Skin: Skin is warm.   upon evaluation I noted there to be mild to moderate discomfort through the entire plantar fascial band. I was unable to find an area of acute tenderness.There is mild edema around the MPJs bilateral. I checked her thoroughly for neurological disease or other possible issues and saw no indications of this. Patient states that the pain is getting worse as the day goes on specially after she's been on her feet.     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 Not rule out that there may not be issue with back or possible systemic inflammatory condition    Plan:     H&P performed. X-rays reviewed. Spent a great deal of time discussing with her the different possibilities for her problem and reviewed these possibilities. Today we're going to begin night splint usage in order to stretch the plantar fascia bilateral. I am placing her on 12 day Sterapred DS dose pack and scanned for customized orthotic devices. We'll see her back when the orthotics are ready or if there is other issues that occurred during this time.

## 2013-09-27 ENCOUNTER — Telehealth: Payer: Self-pay | Admitting: Internal Medicine

## 2013-09-27 NOTE — Telephone Encounter (Signed)
Pt called stating that pt is having some UTI symptoms that started last night. She is having some frequency, pressure but no burning. I told pt that you probably would not do it since she has not been seen recently for this and pt states they are short staffed and are unable to leave the office for a couple days to come in. If you can send in a rx before 5 send to [facility] in [place]. If after 5 send to [facility]

## 2013-09-27 NOTE — Telephone Encounter (Signed)
Call and see what her symptoms are, any reason she thinks she would be getting a UTI?  Does she have any vaginal symptoms or concerns?  Any urine odor, cloudy, blood?  Do they not keep any UA dipsticks there even to check urine for glucose?

## 2013-09-27 NOTE — Telephone Encounter (Signed)
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

## 2013-09-27 NOTE — Telephone Encounter (Signed)
See if she can do a UA dipstick at work.  She works in a [employer]/product [employer].   Have her nurse call me with UA results.

## 2013-10-19 ENCOUNTER — Other Ambulatory Visit: Payer: Self-pay

## 2013-10-30 ENCOUNTER — Other Ambulatory Visit: Payer: Self-pay | Admitting: Medical

## 2013-11-20 ENCOUNTER — Ambulatory Visit: Payer: 59 | Admitting: Podiatry

## 2013-12-19 ENCOUNTER — Telehealth: Payer: Self-pay | Admitting: Internal Medicine

## 2013-12-19 MED ORDER — AMPHETAMINE-DEXTROAMPHET ER 25 MG PO CP24: 25.0000 mg | ORAL_CAPSULE | ORAL | Status: DC

## 2013-12-19 NOTE — Telephone Encounter (Signed)
Per shane. He wanted me to print off adderall rx for pt

## 2013-12-26 ENCOUNTER — Encounter: Payer: 59 | Admitting: Medical

## 2014-01-09 ENCOUNTER — Ambulatory Visit (INDEPENDENT_AMBULATORY_CARE_PROVIDER_SITE_OTHER): Payer: 59 | Admitting: *Deleted

## 2014-01-09 DIAGNOSIS — M722 Plantar fascial fibromatosis: Secondary | ICD-10-CM

## 2014-01-09 NOTE — Patient Instructions (Signed)

## 2014-01-09 NOTE — Progress Notes (Signed)
PUO 

## 2014-01-31 ENCOUNTER — Ambulatory Visit (INDEPENDENT_AMBULATORY_CARE_PROVIDER_SITE_OTHER): Payer: 59 | Admitting: Family Medicine

## 2014-01-31 ENCOUNTER — Encounter: Payer: Self-pay | Admitting: Family Medicine

## 2014-01-31 VITALS — BP 160/100 | HR 82 | Temp 98.1°F | Wt 185.0 lb

## 2014-01-31 DIAGNOSIS — H6691 Otitis media, unspecified, right ear: Secondary | ICD-10-CM

## 2014-01-31 DIAGNOSIS — H6690 Otitis media, unspecified, unspecified ear: Secondary | ICD-10-CM

## 2014-01-31 MED ORDER — AMOXICILLIN 875 MG PO TABS: 875.0000 mg | ORAL_TABLET | Freq: Two times a day (BID) | ORAL | Status: DC

## 2014-01-31 NOTE — Progress Notes (Signed)
   Subjective:    Patient ID: [name], female    DOB: Jan 15, 1971, 43 y.o.   MRN: [id number]  HPI She has a ten-day history of sore throat, headache, earache but no fever, chills, cough or congestion. She also has noted some drainage from both eyes and has woken up on a couple of occasions with her eyes matted shut. She does not smoke.   Review of Systems     Objective:   Physical Exam alert and in no distress. Tympanic membrane on the right is dull and vascular, left is normal canals are normal. Throat is clear. Tonsils are normal. Neck is supple without adenopathy or thyromegaly. Cardiac exam shows a regular sinus rhythm without murmurs or gallops. Lungs are clear to auscultation.        Assessment & Plan:  ROM (right otitis media) - Plan: amoxicillin (AMOXIL) 875 MG tablet  She is to call if not entirely better when she finishes the course of antibiotic.

## 2014-01-31 NOTE — Patient Instructions (Signed)
Take all the antibiotic and if not totally back to normal when you finish, give me a call 

## 2014-02-01 ENCOUNTER — Ambulatory Visit: Payer: 59 | Admitting: Family Medicine

## 2014-02-06 ENCOUNTER — Ambulatory Visit: Payer: 59 | Admitting: Podiatry

## 2014-02-07 ENCOUNTER — Telehealth: Payer: Self-pay | Admitting: Family Medicine

## 2014-02-08 NOTE — Telephone Encounter (Signed)
She should finish up on her antibiotic in a few days to have her call us if she's not totally back to normal by Monday.

## 2014-02-09 NOTE — Telephone Encounter (Signed)
Handle this

## 2014-02-09 NOTE — Telephone Encounter (Signed)
Called and left message for pt TCB

## 2014-02-12 NOTE — Telephone Encounter (Signed)
Called and left another message for pt to call back regarding antibiotic

## 2014-05-01 ENCOUNTER — Encounter: Payer: Self-pay | Admitting: Medical

## 2014-05-01 ENCOUNTER — Ambulatory Visit (INDEPENDENT_AMBULATORY_CARE_PROVIDER_SITE_OTHER): Payer: 59 | Admitting: Medical

## 2014-05-01 VITALS — BP 138/90 | HR 82 | Temp 98.3°F | Resp 16 | Wt 190.0 lb

## 2014-05-01 DIAGNOSIS — D649 Anemia, unspecified: Secondary | ICD-10-CM

## 2014-05-01 DIAGNOSIS — F341 Dysthymic disorder: Secondary | ICD-10-CM

## 2014-05-01 DIAGNOSIS — F418 Other specified anxiety disorders: Secondary | ICD-10-CM

## 2014-05-01 DIAGNOSIS — E039 Hypothyroidism, unspecified: Secondary | ICD-10-CM

## 2014-05-01 DIAGNOSIS — F988 Other specified behavioral and emotional disorders with onset usually occurring in childhood and adolescence: Secondary | ICD-10-CM

## 2014-05-01 MED ORDER — AMPHETAMINE-DEXTROAMPHET ER 25 MG PO CP24: 25.0000 mg | ORAL_CAPSULE | ORAL | Status: DC

## 2014-05-01 MED ORDER — LEVOTHYROXINE SODIUM 175 MCG PO TABS: 175.0000 ug | ORAL_TABLET | Freq: Every day | ORAL | Status: DC

## 2014-05-01 MED ORDER — VENLAFAXINE HCL ER 37.5 MG PO CP24: 37.5000 mg | ORAL_CAPSULE | Freq: Every day | ORAL | Status: DC

## 2014-05-01 MED ORDER — FERROUS GLUCONATE 324 (38 FE) MG PO TABS: 324.0000 mg | ORAL_TABLET | Freq: Two times a day (BID) | ORAL | Status: DC

## 2014-05-01 NOTE — Progress Notes (Signed)
   Subjective:   [name] is a 43 y.o. female presenting on 05/01/2014 with medication check and she needs refills on everything  Anemia - not taking iron, last iron - can't remember.  Periods are not heavy.  No hx/o fibroids.  Has had hemorrhoids once prior.   Iron really constipates her.  She doesn't recall being told to do stool studies.  Hypothyroidism - works for endocrinologist, Dr. Edison Nasuti, and he has felt her thyroid and examined her ultrasound, and didn't think she needed repat [place] at this time.  Taking Synthroid 155mcg daily.  ADD - been out of Adderal a month or so.  Can certainly tell when not on the medication, having trouble focusing, hard to get organized day to day.  Still taking Wellbutrin daily.  Has had worse irritability and mood swings around time of periods the past few months.  Has been on zoloft, and it made her numb, no emotions.  No prior effexor.  Was on prozac years ago.  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  has new boyfriend since 07/2013, good relationship.  No other aggravating or relieving factors.  No other complaint.  Review of Systems ROS as in subjective      Objective:    BP 138/90  Pulse 82  Temp(Src) 98.3 F (36.8 C) (Oral)  Resp 16  Wt 190 lb (86.183 kg)  General appearance: alert, no distress, WD/WN Oral cavity: MMM, no lesions Neck: supple, no lymphadenopathy, no thyromegaly, no masses Heart: RRR, normal S1, S2, no murmurs Lungs: CTA bilaterally, no wheezes, rhonchi, or rales Abdomen: +bs, soft, non tender, non distended, no masses, no hepatomegaly, no splenomegaly Pulses: 2+ symmetric, upper and lower extremities, normal cap refill      Assessment: Encounter Diagnoses  Name Primary?  . ADD (attention deficit disorder) Yes  . Depression with anxiety   . Anemia   . Unspecified hypothyroidism      Plan: Labs today, c/t Adderall 25mg  XR daily, change from wellbutrin to Effexor, discussed labs  from last visit.  Refilled medications today, labs today, stool cards given to return. F/u pending labs  [name] was seen today for medication check and she needs refills on everything.  Diagnoses and associated orders for this visit:  ADD (attention deficit disorder)  Depression with anxiety  Anemia - CBC with Differential - Cancel: CBC with Differential  Unspecified hypothyroidism - TSH - T4, free  Other Orders - levothyroxine (SYNTHROID, LEVOTHROID) 175 MCG tablet; Take 1 tablet (175 mcg total) by mouth daily before breakfast. - ferrous gluconate (FERGON) 324 MG tablet; Take 1 tablet (324 mg total) by mouth 2 (two) times daily. With food - amphetamine-dextroamphetamine (ADDERALL XR) 25 MG 24 hr capsule; Take 1 capsule (25 mg total) by mouth every morning. - venlafaxine XR (EFFEXOR-XR) 37.5 MG 24 hr capsule; Take 1 capsule (37.5 mg total) by mouth daily with breakfast.    Return pending lab.

## 2014-05-02 ENCOUNTER — Other Ambulatory Visit: Payer: Self-pay | Admitting: Medical

## 2014-05-02 LAB — CBC WITH DIFFERENTIAL/PLATELET
BASOS PCT: 1 % (ref 0–1)
Basophils Absolute: 0.1 10*3/uL (ref 0.0–0.1)
EOS ABS: 0.4 10*3/uL (ref 0.0–0.7)
Eosinophils Relative: 5 % (ref 0–5)
HCT: 31.5 % — ABNORMAL LOW (ref 36.0–46.0)
HEMOGLOBIN: 10 g/dL — AB (ref 12.0–15.0)
Lymphocytes Relative: 31 % (ref 12–46)
Lymphs Abs: 2.2 10*3/uL (ref 0.7–4.0)
MCH: 23.4 pg — AB (ref 26.0–34.0)
MCHC: 31.7 g/dL (ref 30.0–36.0)
MCV: 73.8 fL — ABNORMAL LOW (ref 78.0–100.0)
MONOS PCT: 7 % (ref 3–12)
Monocytes Absolute: 0.5 10*3/uL (ref 0.1–1.0)
Neutro Abs: 4 10*3/uL (ref 1.7–7.7)
Neutrophils Relative %: 56 % (ref 43–77)
PLATELETS: 392 10*3/uL (ref 150–400)
RBC: 4.27 MIL/uL (ref 3.87–5.11)
RDW: 17 % — ABNORMAL HIGH (ref 11.5–15.5)
WBC: 7.2 10*3/uL (ref 4.0–10.5)

## 2014-05-02 LAB — TSH: TSH: 12.87 u[IU]/mL — AB (ref 0.350–4.500)

## 2014-05-02 LAB — T4, FREE: FREE T4: 0.71 ng/dL — AB (ref 0.80–1.80)

## 2014-05-02 MED ORDER — LEVOTHYROXINE SODIUM 200 MCG PO TABS: 200.0000 ug | ORAL_TABLET | Freq: Every day | ORAL | Status: DC

## 2014-06-26 ENCOUNTER — Encounter: Payer: Self-pay | Admitting: Obstetrics & Gynecology

## 2014-06-26 ENCOUNTER — Ambulatory Visit (INDEPENDENT_AMBULATORY_CARE_PROVIDER_SITE_OTHER): Payer: 59 | Admitting: Obstetrics & Gynecology

## 2014-06-26 VITALS — BP 154/92 | HR 90 | Resp 16 | Ht 66.75 in | Wt 194.0 lb

## 2014-06-26 DIAGNOSIS — Z1151 Encounter for screening for human papillomavirus (HPV): Secondary | ICD-10-CM

## 2014-06-26 DIAGNOSIS — N920 Excessive and frequent menstruation with regular cycle: Secondary | ICD-10-CM

## 2014-06-26 DIAGNOSIS — Z124 Encounter for screening for malignant neoplasm of cervix: Secondary | ICD-10-CM

## 2014-06-26 DIAGNOSIS — Z01419 Encounter for gynecological examination (general) (routine) without abnormal findings: Secondary | ICD-10-CM

## 2014-06-26 MED ORDER — FLUCONAZOLE 150 MG PO TABS: 150.0000 mg | ORAL_TABLET | Freq: Once | ORAL | Status: DC

## 2014-06-26 NOTE — Patient Instructions (Signed)
Levonorgestrel intrauterine device (IUD) What is this medicine? LEVONORGESTREL IUD (LEE voe nor jes trel) is a contraceptive (birth control) device. The device is placed inside the uterus by a healthcare professional. It is used to prevent pregnancy and can also be used to treat heavy bleeding that occurs during your period. Depending on the device, it can be used for 3 to 5 years. This medicine may be used for other purposes; ask your health care provider or pharmacist if you have questions. COMMON BRAND NAME(S): Mirena, Skyla What should I tell my health care provider before I take this medicine? They need to know if you have any of these conditions: -abnormal Pap smear -cancer of the breast, uterus, or cervix -diabetes -endometritis -genital or pelvic infection now or in the past -have more than one sexual partner or your partner has more than one partner -heart disease -history of an ectopic or tubal pregnancy -immune system problems -IUD in place -liver disease or tumor -problems with blood clots or take blood-thinners -use intravenous drugs -uterus of unusual shape -vaginal bleeding that has not been explained -an unusual or allergic reaction to levonorgestrel, other hormones, silicone, or polyethylene, medicines, foods, dyes, or preservatives -pregnant or trying to get pregnant -breast-feeding How should I use this medicine? This device is placed inside the uterus by a health care professional. Talk to your pediatrician regarding the use of this medicine in children. Special care may be needed. Overdosage: If you think you have taken too much of this medicine contact a poison control center or emergency room at once. NOTE: This medicine is only for you. Do not share this medicine with others. What if I miss a dose? This does not apply. What may interact with this medicine? Do not take this medicine with any of the following  medications: -amprenavir -bosentan -fosamprenavir This medicine may also interact with the following medications: -aprepitant -barbiturate medicines for inducing sleep or treating seizures -bexarotene -griseofulvin -medicines to treat seizures like carbamazepine, ethotoin, felbamate, oxcarbazepine, phenytoin, topiramate -modafinil -pioglitazone -rifabutin -rifampin -rifapentine -some medicines to treat HIV infection like atazanavir, indinavir, lopinavir, nelfinavir, tipranavir, ritonavir -St. John's wort -warfarin This list may not describe all possible interactions. Give your health care provider a list of all the medicines, herbs, non-prescription drugs, or dietary supplements you use. Also tell them if you smoke, drink alcohol, or use illegal drugs. Some items may interact with your medicine. What should I watch for while using this medicine? Visit your doctor or health care professional for regular check ups. See your doctor if you or your partner has sexual contact with others, becomes HIV positive, or gets a sexual transmitted disease. This product does not protect you against HIV infection (AIDS) or other sexually transmitted diseases. You can check the placement of the IUD yourself by reaching up to the top of your vagina with clean fingers to feel the threads. Do not pull on the threads. It is a good habit to check placement after each menstrual period. Call your doctor right away if you feel more of the IUD than just the threads or if you cannot feel the threads at all. The IUD may come out by itself. You may become pregnant if the device comes out. If you notice that the IUD has come out use a backup birth control method like condoms and call your health care provider. Using tampons will not change the position of the IUD and are okay to use during your period. What side effects may I   notice from receiving this medicine? Side effects that you should report to your doctor or  health care professional as soon as possible: -allergic reactions like skin rash, itching or hives, swelling of the face, lips, or tongue -fever, flu-like symptoms -genital sores -high blood pressure -no menstrual period for 6 weeks during use -pain, swelling, warmth in the leg -pelvic pain or tenderness -severe or sudden headache -signs of pregnancy -stomach cramping -sudden shortness of breath -trouble with balance, talking, or walking -unusual vaginal bleeding, discharge -yellowing of the eyes or skin Side effects that usually do not require medical attention (report to your doctor or health care professional if they continue or are bothersome): -acne -breast pain -change in sex drive or performance -changes in weight -cramping, dizziness, or faintness while the device is being inserted -headache -irregular menstrual bleeding within first 3 to 6 months of use -nausea This list may not describe all possible side effects. Call your doctor for medical advice about side effects. You may report side effects to FDA at 1-800-FDA-1088. Where should I keep my medicine? This does not apply. NOTE: This sheet is a summary. It may not cover all possible information. If you have questions about this medicine, talk to your doctor, pharmacist, or health care provider.  2015, Elsevier/Gold Standard. (2011-12-31 13:54:04)  

## 2014-06-26 NOTE — Progress Notes (Signed)
Patient ID: [name], female   DOB: [birth year], 43 y.o.   MRN: [id number]  Chief Complaint  Patient presents with  . Gynecologic Exam    HPI [name] is a 43 y.o. female.  Patient's last menstrual period was 06/13/2014. [id number] S/P BTL, has heavy periods and dysmenorrhea, uses ibuprofen with some relief but flow is for 7 days and affects her activity.  HPI  Past Medical History  Diagnosis Date  . Edema   . Hypothyroidism   . Allergy   . History of pneumonia   . Iron deficiency anemia   . Chronic headache   . Depression   . Anxiety   . Joint pain   . ADD (attention deficit disorder)   . Thyroid nodule     ultrasound 10/13  . Heavy periods   . Vitamin D deficiency     Past Surgical History  Procedure Laterality Date  . Wisdom tooth extraction    . Tubal ligation      Family History  Problem Relation Age of Onset  . Other Son     hx/o guillian barre  . Stroke Neg Hx   . Heart disease Neg Hx   . Cancer Neg Hx   . Diabetes Neg Hx     Social History History  Substance Use Topics  . Smoking status: Never Smoker   . Smokeless tobacco: Not on file  . Alcohol Use: 1.8 oz/week    3 Glasses of wine per week    No Known Allergies  Current Outpatient Prescriptions  Medication Sig Dispense Refill  . [START ON 07/01/2014] amphetamine-dextroamphetamine (ADDERALL XR) 25 MG 24 hr capsule Take 1 capsule (25 mg total) by mouth every morning.  30 capsule  0  . levothyroxine (SYNTHROID) 200 MCG tablet Take 1 tablet (200 mcg total) by mouth daily before breakfast.  30 tablet  2   No current facility-administered medications for this visit.    Review of Systems Review of Systems  Constitutional: Negative.   Genitourinary: Positive for vaginal discharge.    Blood pressure 154/92, pulse 90, resp. rate 16, height 5' 6.75" (1.695 m), weight 194 lb (87.998 kg), last menstrual period 06/13/2014.  Physical Exam Physical Exam  Constitutional: She  appears well-developed. No distress.  Pulmonary/Chest: Effort normal.  Abdominal: Soft. Bowel sounds are normal.  Genitourinary: Uterus normal. Vaginal discharge found.  Neurological: She is alert.  Skin: Skin is warm and dry.  Psychiatric: She has a normal mood and affect.    Data Reviewed CBC    Component Value Date/Time   WBC 7.2 05/01/2014 1548   RBC 4.27 05/01/2014 1548   HGB 10.0* 05/01/2014 1548   HCT 31.5* 05/01/2014 1548   PLT 392 05/01/2014 1548   MCV 73.8* 05/01/2014 1548   MCH 23.4* 05/01/2014 1548   MCHC 31.7 05/01/2014 1548   RDW 17.0* 05/01/2014 1548   LYMPHSABS 2.2 05/01/2014 1548   MONOABS 0.5 05/01/2014 1548   EOSABS 0.4 05/01/2014 1548   BASOSABS 0.1 05/01/2014 1548      Assessment    Menorrhagia and yeast    diflucan Plan    US pelvis Wants to have Mirena        [name] 06/26/2014, 10:56 AM

## 2014-06-28 LAB — CYTOLOGY - PAP

## 2014-07-02 ENCOUNTER — Other Ambulatory Visit: Payer: Self-pay | Admitting: Obstetrics & Gynecology

## 2014-07-02 ENCOUNTER — Ambulatory Visit (HOSPITAL_COMMUNITY)
Admission: RE | Admit: 2014-07-02 | Discharge: 2014-07-02 | Disposition: A | Discharge status: Home / Self Care | Payer: 59 | Source: Ambulatory Visit | Attending: Obstetrics & Gynecology | Admitting: Obstetrics & Gynecology

## 2014-07-02 ENCOUNTER — Ambulatory Visit (HOSPITAL_COMMUNITY): Admission: RE | Admit: 2014-07-02 | Payer: 59 | Source: Ambulatory Visit

## 2014-07-02 DIAGNOSIS — D259 Leiomyoma of uterus, unspecified: Secondary | ICD-10-CM | POA: Insufficient documentation

## 2014-07-02 DIAGNOSIS — N920 Excessive and frequent menstruation with regular cycle: Secondary | ICD-10-CM | POA: Insufficient documentation

## 2014-07-02 DIAGNOSIS — N946 Dysmenorrhea, unspecified: Secondary | ICD-10-CM | POA: Insufficient documentation

## 2014-07-24 ENCOUNTER — Ambulatory Visit (INDEPENDENT_AMBULATORY_CARE_PROVIDER_SITE_OTHER): Payer: 59 | Admitting: Obstetrics & Gynecology

## 2014-07-24 ENCOUNTER — Encounter: Payer: Self-pay | Admitting: *Deleted

## 2014-07-24 ENCOUNTER — Encounter: Payer: Self-pay | Admitting: Obstetrics & Gynecology

## 2014-07-24 VITALS — BP 164/94 | HR 83 | Resp 16 | Ht 66.0 in | Wt 193.0 lb

## 2014-07-24 DIAGNOSIS — Z30430 Encounter for insertion of intrauterine contraceptive device: Secondary | ICD-10-CM

## 2014-07-24 DIAGNOSIS — N920 Excessive and frequent menstruation with regular cycle: Secondary | ICD-10-CM

## 2014-07-24 DIAGNOSIS — Z308 Encounter for other contraceptive management: Secondary | ICD-10-CM

## 2014-07-24 DIAGNOSIS — T192XXS Foreign body in vulva and vagina, sequela: Secondary | ICD-10-CM

## 2014-07-24 DIAGNOSIS — Z01812 Encounter for preprocedural laboratory examination: Secondary | ICD-10-CM

## 2014-07-24 LAB — POCT URINE PREGNANCY: Preg Test, Ur: NEGATIVE

## 2014-07-24 MED ORDER — LEVONORGESTREL 20 MCG/24HR IU IUD
1.0000 | INTRAUTERINE_SYSTEM | Freq: Once | INTRAUTERINE | Status: AC
  Administered 2014-07-24: 1 via INTRAUTERINE

## 2014-07-24 NOTE — Progress Notes (Signed)
Patient ID: [name], female   DOB: [birth year], 43 y.o.   MRN: [id number] Routine Gyn Exam Patient here for routine exam. Current complaints: wants Mirena for cycle control. Personal health questionnaire reviewed: yes vaginal odor for 1 week   Gynecologic History Patient's last menstrual period was 07/09/2014. Contraception: tubal ligation Last Pap: 06/2014. Results were: normal   Obstetric History OB History  Gravida Para Term Preterm AB SAB TAB Ectopic Multiple Living  3 2   1  1   2     # Outcome Date GA Lbr Len/2nd Weight Sex Delivery Anes PTL Lv  3 PAR           2 PAR           1 TAB               Patient identified, informed consent performed, signed copy in chart, time out was performed.  Urine pregnancy test negative.  Speculum placed in the vagina.  Cervix visualized. Retained tampon removed. Cleaned with Betadine x 2.  Grasped anteriourly with a single tooth tenaculum.  Uterus sounded to 8 cm, retroverted, cervix dilated.  Mirena IUD placed per manufacturer's recommendations.  Strings trimmed to 3 cm.   Patient given post procedure instructions and Mirena care card with expiration date.  Patient is asked to check IUD strings periodically and follow up in 4-6 weeks for IUD check.  Woodroe Mode, MD 07/24/2014

## 2014-08-07 ENCOUNTER — Encounter: Payer: 59 | Admitting: Family Medicine

## 2014-08-27 ENCOUNTER — Encounter: Payer: Self-pay | Admitting: Obstetrics & Gynecology

## 2014-08-27 ENCOUNTER — Ambulatory Visit (INDEPENDENT_AMBULATORY_CARE_PROVIDER_SITE_OTHER): Payer: 59 | Admitting: Obstetrics & Gynecology

## 2014-08-27 VITALS — BP 155/92 | HR 98 | Resp 16 | Wt 201.0 lb

## 2014-08-27 DIAGNOSIS — Z30431 Encounter for routine checking of intrauterine contraceptive device: Secondary | ICD-10-CM

## 2014-08-27 NOTE — Progress Notes (Signed)
Patient ID: [name], female   DOB: 1970-12-17, 43 y.o.   MRN: [id number] History:  43 y.o. [id number] here today for today for IUD string check; Mirena IUD was placed  07/24/2014. Pt c/o feeling more emotional on the Mirena but, bleeding already improving.  The following portions of the patient's history were reviewed and updated as appropriate: allergies, current medications, past family history, past medical history, past social history, past surgical history and problem list.   Review of Systems:  Pertinent items are noted in HPI.  Objective:  Physical Exam Blood pressure 155/92, pulse 98, resp. rate 16, weight 201 lb (91.173 kg), last menstrual period 08/20/2014. Gen: NAD Abd: Soft, nontender and nondistended Pelvic: Normal appearing external genitalia; normal appearing vaginal mucosa and cervix.  IUD strings visualized, about 3 cm in length outside cervix.   Assessment & Plan:  Normal IUD check. Patient to keep IUD in place for five- seven years as pt has had a BTL and is using this for bleeding only. F/u 1 year for annual Routine preventative health maintenance measures emphasized.

## 2014-08-27 NOTE — Patient Instructions (Signed)
Levonorgestrel intrauterine device (IUD) What is this medicine? LEVONORGESTREL IUD (LEE voe nor jes trel) is a contraceptive (birth control) device. The device is placed inside the uterus by a healthcare professional. It is used to prevent pregnancy and can also be used to treat heavy bleeding that occurs during your period. Depending on the device, it can be used for 3 to 5 years. This medicine may be used for other purposes; ask your health care provider or pharmacist if you have questions. COMMON BRAND NAME(S): LILETTA, Mirena, Skyla What should I tell my health care provider before I take this medicine? They need to know if you have any of these conditions: -abnormal Pap smear -cancer of the breast, uterus, or cervix -diabetes -endometritis -genital or pelvic infection now or in the past -have more than one sexual partner or your partner has more than one partner -heart disease -history of an ectopic or tubal pregnancy -immune system problems -IUD in place -liver disease or tumor -problems with blood clots or take blood-thinners -use intravenous drugs -uterus of unusual shape -vaginal bleeding that has not been explained -an unusual or allergic reaction to levonorgestrel, other hormones, silicone, or polyethylene, medicines, foods, dyes, or preservatives -pregnant or trying to get pregnant -breast-feeding How should I use this medicine? This device is placed inside the uterus by a health care professional. Talk to your pediatrician regarding the use of this medicine in children. Special care may be needed. Overdosage: If you think you have taken too much of this medicine contact a poison control center or emergency room at once. NOTE: This medicine is only for you. Do not share this medicine with others. What if I miss a dose? This does not apply. What may interact with this medicine? Do not take this medicine with any of the following  medications: -amprenavir -bosentan -fosamprenavir This medicine may also interact with the following medications: -aprepitant -barbiturate medicines for inducing sleep or treating seizures -bexarotene -griseofulvin -medicines to treat seizures like carbamazepine, ethotoin, felbamate, oxcarbazepine, phenytoin, topiramate -modafinil -pioglitazone -rifabutin -rifampin -rifapentine -some medicines to treat HIV infection like atazanavir, indinavir, lopinavir, nelfinavir, tipranavir, ritonavir -St. John's wort -warfarin This list may not describe all possible interactions. Give your health care provider a list of all the medicines, herbs, non-prescription drugs, or dietary supplements you use. Also tell them if you smoke, drink alcohol, or use illegal drugs. Some items may interact with your medicine. What should I watch for while using this medicine? Visit your doctor or health care professional for regular check ups. See your doctor if you or your partner has sexual contact with others, becomes HIV positive, or gets a sexual transmitted disease. This product does not protect you against HIV infection (AIDS) or other sexually transmitted diseases. You can check the placement of the IUD yourself by reaching up to the top of your vagina with clean fingers to feel the threads. Do not pull on the threads. It is a good habit to check placement after each menstrual period. Call your doctor right away if you feel more of the IUD than just the threads or if you cannot feel the threads at all. The IUD may come out by itself. You may become pregnant if the device comes out. If you notice that the IUD has come out use a backup birth control method like condoms and call your health care provider. Using tampons will not change the position of the IUD and are okay to use during your period. What side effects may   I notice from receiving this medicine? Side effects that you should report to your doctor or  health care professional as soon as possible: -allergic reactions like skin rash, itching or hives, swelling of the face, lips, or tongue -fever, flu-like symptoms -genital sores -high blood pressure -no menstrual period for 6 weeks during use -pain, swelling, warmth in the leg -pelvic pain or tenderness -severe or sudden headache -signs of pregnancy -stomach cramping -sudden shortness of breath -trouble with balance, talking, or walking -unusual vaginal bleeding, discharge -yellowing of the eyes or skin Side effects that usually do not require medical attention (report to your doctor or health care professional if they continue or are bothersome): -acne -breast pain -change in sex drive or performance -changes in weight -cramping, dizziness, or faintness while the device is being inserted -headache -irregular menstrual bleeding within first 3 to 6 months of use -nausea This list may not describe all possible side effects. Call your doctor for medical advice about side effects. You may report side effects to FDA at 1-800-FDA-1088. Where should I keep my medicine? This does not apply. NOTE: This sheet is a summary. It may not cover all possible information. If you have questions about this medicine, talk to your doctor, pharmacist, or health care provider.  2015, Elsevier/Gold Standard. (2011-12-31 13:54:04)  

## 2014-09-05 ENCOUNTER — Ambulatory Visit (INDEPENDENT_AMBULATORY_CARE_PROVIDER_SITE_OTHER): Payer: 59 | Admitting: Family Medicine

## 2014-09-05 ENCOUNTER — Encounter: Payer: Self-pay | Admitting: Family Medicine

## 2014-09-05 VITALS — BP 130/78 | Ht 66.75 in | Wt 202.6 lb

## 2014-09-05 DIAGNOSIS — F988 Other specified behavioral and emotional disorders with onset usually occurring in childhood and adolescence: Secondary | ICD-10-CM

## 2014-09-05 DIAGNOSIS — E038 Other specified hypothyroidism: Secondary | ICD-10-CM

## 2014-09-05 DIAGNOSIS — IMO0001 Reserved for inherently not codable concepts without codable children: Secondary | ICD-10-CM

## 2014-09-05 DIAGNOSIS — Z13220 Encounter for screening for lipoid disorders: Secondary | ICD-10-CM

## 2014-09-05 DIAGNOSIS — R030 Elevated blood-pressure reading, without diagnosis of hypertension: Secondary | ICD-10-CM

## 2014-09-05 DIAGNOSIS — D509 Iron deficiency anemia, unspecified: Secondary | ICD-10-CM

## 2014-09-05 MED ORDER — AMPHETAMINE-DEXTROAMPHET ER 25 MG PO CP24: 25.0000 mg | ORAL_CAPSULE | ORAL | Status: DC

## 2014-09-05 NOTE — Progress Notes (Signed)
   Subjective:    Patient ID: [name], female    DOB: September 05, 1971, 43 y.o.   MRN: [id number]  HPI Patient arrives to get established in our office. Patient has ADHD and hypothyroidism. Patient also concerned about blood pressure reading. Long discussion held with patient and she has had ADD symptoms since childhood. She does wonderful her medicine when she doesn't have her medicines she has difficult time staying focused She's noticed her blood pressures been slightly elevated recently she does state she has put on weight she is going to try to work hard at losing weight she denies chest tightness pressure pain or headaches She has a history of hypothyroidism and has not been adjusted recently she used to be on 175 mcg currently on 200 mcg.  Review of Systems  Constitutional: Negative for activity change, appetite change and fatigue.  HENT: Negative for congestion.   Respiratory: Negative for cough.   Cardiovascular: Negative for chest pain.  Gastrointestinal: Negative for abdominal pain.  Neurological: Negative for headaches.  Psychiatric/Behavioral: Negative for behavioral problems.       Objective:   Physical Exam  Vitals reviewed. Constitutional: She appears well-nourished. No distress.  Cardiovascular: Normal rate, regular rhythm and normal heart sounds.   No murmur heard. Pulmonary/Chest: Effort normal and breath sounds normal. No respiratory distress.  Musculoskeletal: She exhibits no edema.  Lymphadenopathy:    She has no cervical adenopathy.  Neurological: She is alert. She exhibits normal muscle tone.  Psychiatric: Her behavior is normal.          Assessment & Plan:  #1 hypothyroidism check thyroid function await the results of this may need adjustments of medication #2 ADD 3 prescriptions were written she needs followup in 3 months proper use was discussed.  #3 HTN borderline she is to do a low salt diet regular physical activity and lose weight and  will recheck her blood pressure again in 3 months may need to be on medication She does need to have cholesterol profile done.  She also has mild anemia lab work was ordered await the results she states she's had heavy cycles although it still just recently

## 2014-09-05 NOTE — Patient Instructions (Signed)
DASH Eating Plan °DASH stands for "Dietary Approaches to Stop Hypertension." The DASH eating plan is a healthy eating plan that has been shown to reduce high blood pressure (hypertension). Additional health benefits may include reducing the risk of type 2 diabetes mellitus, heart disease, and stroke. The DASH eating plan may also help with weight loss. °WHAT DO I NEED TO KNOW ABOUT THE DASH EATING PLAN? °For the DASH eating plan, you will follow these general guidelines: °· Choose foods with a percent daily value for sodium of less than 5% (as listed on the food label). °· Use salt-free seasonings or herbs instead of table salt or sea salt. °· Check with your health care provider or pharmacist before using salt substitutes. °· Eat lower-sodium products, often labeled as "lower sodium" or "no salt added." °· Eat fresh foods. °· Eat more vegetables, fruits, and low-fat dairy products. °· Choose whole grains. Look for the word "whole" as the first word in the ingredient list. °· Choose fish and skinless chicken or turkey more often than red meat. Limit fish, poultry, and meat to 6 oz (170 g) each day. °· Limit sweets, desserts, sugars, and sugary drinks. °· Choose heart-healthy fats. °· Limit cheese to 1 oz (28 g) per day. °· Eat more home-cooked food and less restaurant, buffet, and fast food. °· Limit fried foods. °· Cook foods using methods other than frying. °· Limit canned vegetables. If you do use them, rinse them well to decrease the sodium. °· When eating at a restaurant, ask that your food be prepared with less salt, or no salt if possible. °WHAT FOODS CAN I EAT? °Seek help from a dietitian for individual calorie needs. °Grains °Whole grain or whole wheat bread. Brown rice. Whole grain or whole wheat pasta. Quinoa, bulgur, and whole grain cereals. Low-sodium cereals. Corn or whole wheat flour tortillas. Whole grain cornbread. Whole grain crackers. Low-sodium crackers. °Vegetables °Fresh or frozen vegetables  (raw, steamed, roasted, or grilled). Low-sodium or reduced-sodium tomato and vegetable juices. Low-sodium or reduced-sodium tomato sauce and paste. Low-sodium or reduced-sodium canned vegetables.  °Fruits °All fresh, canned (in natural juice), or frozen fruits. °Meat and Other Protein Products °Ground beef (85% or leaner), grass-fed beef, or beef trimmed of fat. Skinless chicken or turkey. Ground chicken or turkey. Pork trimmed of fat. All fish and seafood. Eggs. Dried beans, peas, or lentils. Unsalted nuts and seeds. Unsalted canned beans. °Dairy °Low-fat dairy products, such as skim or 1% milk, 2% or reduced-fat cheeses, low-fat ricotta or cottage cheese, or plain low-fat yogurt. Low-sodium or reduced-sodium cheeses. °Fats and Oils °Tub margarines without trans fats. Light or reduced-fat mayonnaise and salad dressings (reduced sodium). Avocado. Safflower, olive, or canola oils. Natural peanut or almond butter. °Other °Unsalted popcorn and pretzels. °The items listed above may not be a complete list of recommended foods or beverages. Contact your dietitian for more options. °WHAT FOODS ARE NOT RECOMMENDED? °Grains °White bread. White pasta. White rice. Refined cornbread. Bagels and croissants. Crackers that contain trans fat. °Vegetables °Creamed or fried vegetables. Vegetables in a cheese sauce. Regular canned vegetables. Regular canned tomato sauce and paste. Regular tomato and vegetable juices. °Fruits °Dried fruits. Canned fruit in light or heavy syrup. Fruit juice. °Meat and Other Protein Products °Fatty cuts of meat. Ribs, chicken wings, bacon, sausage, bologna, salami, chitterlings, fatback, hot dogs, bratwurst, and packaged luncheon meats. Salted nuts and seeds. Canned beans with salt. °Dairy °Whole or 2% milk, cream, half-and-half, and cream cheese. Whole-fat or sweetened yogurt. Full-fat   cheeses or blue cheese. Nondairy creamers and whipped toppings. Processed cheese, cheese spreads, or cheese  curds. °Condiments °Onion and garlic salt, seasoned salt, table salt, and sea salt. Canned and packaged gravies. Worcestershire sauce. Tartar sauce. Barbecue sauce. Teriyaki sauce. Soy sauce, including reduced sodium. Steak sauce. Fish sauce. Oyster sauce. Cocktail sauce. Horseradish. Ketchup and mustard. Meat flavorings and tenderizers. Bouillon cubes. Hot sauce. Tabasco sauce. Marinades. Taco seasonings. Relishes. °Fats and Oils °Butter, stick margarine, lard, shortening, ghee, and bacon fat. Coconut, palm kernel, or palm oils. Regular salad dressings. °Other °Pickles and olives. Salted popcorn and pretzels. °The items listed above may not be a complete list of foods and beverages to avoid. Contact your dietitian for more information. °WHERE CAN I FIND MORE INFORMATION? °National Heart, Lung, and Blood Institute: www.nhlbi.nih.gov/health/health-topics/topics/dash/ °Document Released: 11/19/2011 Document Revised: 04/16/2014 Document Reviewed: 10/04/2013 °ExitCare® Patient Information ©2015 [employer]. This information is not intended to replace advice given to you by your health care provider. Make sure you discuss any questions you have with your health care provider. ° °

## 2014-10-15 ENCOUNTER — Encounter: Payer: Self-pay | Admitting: Family Medicine

## 2014-11-27 ENCOUNTER — Ambulatory Visit: Payer: 59 | Admitting: Family Medicine

## 2014-11-28 ENCOUNTER — Ambulatory Visit: Payer: 59 | Admitting: Family Medicine

## 2014-12-14 HISTORY — PX: OTHER SURGICAL HISTORY: SHX169

## 2014-12-25 ENCOUNTER — Ambulatory Visit: Payer: 59 | Admitting: Family Medicine

## 2015-05-01 ENCOUNTER — Other Ambulatory Visit: Payer: Self-pay | Admitting: *Deleted

## 2015-05-01 ENCOUNTER — Telehealth: Payer: Self-pay | Admitting: Family Medicine

## 2015-05-01 DIAGNOSIS — E039 Hypothyroidism, unspecified: Secondary | ICD-10-CM

## 2015-05-01 DIAGNOSIS — Z13220 Encounter for screening for lipoid disorders: Secondary | ICD-10-CM

## 2015-05-01 DIAGNOSIS — Z79899 Other long term (current) drug therapy: Secondary | ICD-10-CM

## 2015-05-01 MED ORDER — LEVOTHYROXINE SODIUM 200 MCG PO TABS: 200.0000 ug | ORAL_TABLET | Freq: Every day | ORAL | Status: DC

## 2015-05-01 NOTE — Telephone Encounter (Signed)
She may have 30 day supply of thyroid medication with one refill, check TSH, free T4, lipid, metabolic 7. No other tests indicated unless patient is desiring to have additional tests

## 2015-05-01 NOTE — Telephone Encounter (Signed)
Pt needs bw orders been seen here 1 time, no chart available   Also would like her Levothyroxine called in as well till her appt please    [facility] (if 30 day)

## 2015-05-01 NOTE — Telephone Encounter (Signed)
Med sent to pharm. bw orders put in. Pt notified on voicemail.

## 2015-05-21 ENCOUNTER — Ambulatory Visit: Payer: 59 | Admitting: Family Medicine

## 2015-05-29 LAB — BASIC METABOLIC PANEL
BUN/Creatinine Ratio: 14 (ref 9–23)
BUN: 12 mg/dL (ref 6–24)
CALCIUM: 9.2 mg/dL (ref 8.7–10.2)
CO2: 20 mmol/L (ref 18–29)
Chloride: 102 mmol/L (ref 97–108)
Creatinine, Ser: 0.87 mg/dL (ref 0.57–1.00)
GFR calc Af Amer: 93 mL/min/{1.73_m2} (ref >59)
GFR, EST NON AFRICAN AMERICAN: 81 mL/min/{1.73_m2} (ref >59)
Glucose: 102 mg/dL — ABNORMAL HIGH (ref 65–99)
Potassium: 4.2 mmol/L (ref 3.5–5.2)
SODIUM: 141 mmol/L (ref 134–144)

## 2015-05-29 LAB — LIPID PANEL
CHOL/HDL RATIO: 5.4 ratio — AB (ref 0.0–4.4)
Cholesterol, Total: 193 mg/dL (ref 100–199)
HDL: 36 mg/dL — AB (ref >39)
LDL Calculated: 121 mg/dL — ABNORMAL HIGH (ref 0–99)
Triglycerides: 181 mg/dL — ABNORMAL HIGH (ref 0–149)
VLDL Cholesterol Cal: 36 mg/dL (ref 5–40)

## 2015-05-29 LAB — T4, FREE: Free T4: 1.59 ng/dL (ref 0.82–1.77)

## 2015-05-29 LAB — TSH: TSH: 0.443 u[IU]/mL — ABNORMAL LOW (ref 0.450–4.500)

## 2015-06-04 ENCOUNTER — Encounter: Payer: Self-pay | Admitting: Family Medicine

## 2015-06-04 ENCOUNTER — Ambulatory Visit (INDEPENDENT_AMBULATORY_CARE_PROVIDER_SITE_OTHER): Payer: 59 | Admitting: Family Medicine

## 2015-06-04 VITALS — BP 144/98 | Ht 66.75 in | Wt 210.0 lb

## 2015-06-04 DIAGNOSIS — R7301 Impaired fasting glucose: Secondary | ICD-10-CM

## 2015-06-04 DIAGNOSIS — E038 Other specified hypothyroidism: Secondary | ICD-10-CM

## 2015-06-04 DIAGNOSIS — IMO0001 Reserved for inherently not codable concepts without codable children: Secondary | ICD-10-CM

## 2015-06-04 DIAGNOSIS — R030 Elevated blood-pressure reading, without diagnosis of hypertension: Secondary | ICD-10-CM

## 2015-06-04 MED ORDER — LEVOTHYROXINE SODIUM 200 MCG PO TABS: 200.0000 ug | ORAL_TABLET | Freq: Every day | ORAL | Status: DC

## 2015-06-04 MED ORDER — BUPROPION HCL ER (XL) 300 MG PO TB24: 300.0000 mg | ORAL_TABLET | Freq: Every day | ORAL | Status: DC

## 2015-06-04 NOTE — Progress Notes (Signed)
   Subjective:    Patient ID: [name], female    DOB: Jun 26, 1971, 44 y.o.   MRN: [id number]  HPIpt arrives today for a med check up. Taking levothyroxine. Had bloodwork done on 6/14.  Pt wants to discuss starting back on wellbutrin. States her cycles are making her crazy.   Pt also concerned about bp running high.    She does relate that her weight is gone up she states she needs to be more mindful of exercise and diet.   Review of Systems    the patient denies any chest tightness pressure pain shortness of breath denies any swelling Objective:   Physical Exam Lungs are clear hearts regular pulses normal extremities no edema skin warm dry blood pressure in taken again elevated.       Assessment & Plan:  1. Other specified hypothyroidism We will reduce the medication half a tablet on Monday one pill on all the other days.  2. Elevated blood pressure Dash diet discussed as well as exercise as of getting blood pressure to come down follow-up 8 weeks  3. Fasting hyperglycemia Importance of minimizing starches in the diet.  Intermittent labile moods reinitiate Wellbutrin XL 3 mg 1 daily paced states she does better on this.

## 2015-06-04 NOTE — Patient Instructions (Signed)
DASH Eating Plan °DASH stands for "Dietary Approaches to Stop Hypertension." The DASH eating plan is a healthy eating plan that has been shown to reduce high blood pressure (hypertension). Additional health benefits may include reducing the risk of type 2 diabetes mellitus, heart disease, and stroke. The DASH eating plan may also help with weight loss. °WHAT DO I NEED TO KNOW ABOUT THE DASH EATING PLAN? °For the DASH eating plan, you will follow these general guidelines: °· Choose foods with a percent daily value for sodium of less than 5% (as listed on the food label). °· Use salt-free seasonings or herbs instead of table salt or sea salt. °· Check with your health care provider or pharmacist before using salt substitutes. °· Eat lower-sodium products, often labeled as "lower sodium" or "no salt added." °· Eat fresh foods. °· Eat more vegetables, fruits, and low-fat dairy products. °· Choose whole grains. Look for the word "whole" as the first word in the ingredient list. °· Choose fish and skinless chicken or turkey more often than red meat. Limit fish, poultry, and meat to 6 oz (170 g) each day. °· Limit sweets, desserts, sugars, and sugary drinks. °· Choose heart-healthy fats. °· Limit cheese to 1 oz (28 g) per day. °· Eat more home-cooked food and less restaurant, buffet, and fast food. °· Limit fried foods. °· Cook foods using methods other than frying. °· Limit canned vegetables. If you do use them, rinse them well to decrease the sodium. °· When eating at a restaurant, ask that your food be prepared with less salt, or no salt if possible. °WHAT FOODS CAN I EAT? °Seek help from a dietitian for individual calorie needs. °Grains °Whole grain or whole wheat bread. Brown rice. Whole grain or whole wheat pasta. Quinoa, bulgur, and whole grain cereals. Low-sodium cereals. Corn or whole wheat flour tortillas. Whole grain cornbread. Whole grain crackers. Low-sodium crackers. °Vegetables °Fresh or frozen vegetables  (raw, steamed, roasted, or grilled). Low-sodium or reduced-sodium tomato and vegetable juices. Low-sodium or reduced-sodium tomato sauce and paste. Low-sodium or reduced-sodium canned vegetables.  °Fruits °All fresh, canned (in natural juice), or frozen fruits. °Meat and Other Protein Products °Ground beef (85% or leaner), grass-fed beef, or beef trimmed of fat. Skinless chicken or turkey. Ground chicken or turkey. Pork trimmed of fat. All fish and seafood. Eggs. Dried beans, peas, or lentils. Unsalted nuts and seeds. Unsalted canned beans. °Dairy °Low-fat dairy products, such as skim or 1% milk, 2% or reduced-fat cheeses, low-fat ricotta or cottage cheese, or plain low-fat yogurt. Low-sodium or reduced-sodium cheeses. °Fats and Oils °Tub margarines without trans fats. Light or reduced-fat mayonnaise and salad dressings (reduced sodium). Avocado. Safflower, olive, or canola oils. Natural peanut or almond butter. °Other °Unsalted popcorn and pretzels. °The items listed above may not be a complete list of recommended foods or beverages. Contact your dietitian for more options. °WHAT FOODS ARE NOT RECOMMENDED? °Grains °White bread. White pasta. White rice. Refined cornbread. Bagels and croissants. Crackers that contain trans fat. °Vegetables °Creamed or fried vegetables. Vegetables in a cheese sauce. Regular canned vegetables. Regular canned tomato sauce and paste. Regular tomato and vegetable juices. °Fruits °Dried fruits. Canned fruit in light or heavy syrup. Fruit juice. °Meat and Other Protein Products °Fatty cuts of meat. Ribs, chicken wings, bacon, sausage, bologna, salami, chitterlings, fatback, hot dogs, bratwurst, and packaged luncheon meats. Salted nuts and seeds. Canned beans with salt. °Dairy °Whole or 2% milk, cream, half-and-half, and cream cheese. Whole-fat or sweetened yogurt. Full-fat   cheeses or blue cheese. Nondairy creamers and whipped toppings. Processed cheese, cheese spreads, or cheese  curds. °Condiments °Onion and garlic salt, seasoned salt, table salt, and sea salt. Canned and packaged gravies. Worcestershire sauce. Tartar sauce. Barbecue sauce. Teriyaki sauce. Soy sauce, including reduced sodium. Steak sauce. Fish sauce. Oyster sauce. Cocktail sauce. Horseradish. Ketchup and mustard. Meat flavorings and tenderizers. Bouillon cubes. Hot sauce. Tabasco sauce. Marinades. Taco seasonings. Relishes. °Fats and Oils °Butter, stick margarine, lard, shortening, ghee, and bacon fat. Coconut, palm kernel, or palm oils. Regular salad dressings. °Other °Pickles and olives. Salted popcorn and pretzels. °The items listed above may not be a complete list of foods and beverages to avoid. Contact your dietitian for more information. °WHERE CAN I FIND MORE INFORMATION? °National Heart, Lung, and Blood Institute: www.nhlbi.nih.gov/health/health-topics/topics/dash/ °Document Released: 11/19/2011 Document Revised: 04/16/2014 Document Reviewed: 10/04/2013 °ExitCare® Patient Information ©2015 [employer]. This information is not intended to replace advice given to you by your health care provider. Make sure you discuss any questions you have with your health care provider. ° °

## 2015-06-24 ENCOUNTER — Ambulatory Visit: Payer: 59 | Admitting: Obstetrics & Gynecology

## 2015-07-30 ENCOUNTER — Ambulatory Visit: Payer: 59 | Admitting: Family Medicine

## 2015-09-17 ENCOUNTER — Ambulatory Visit: Payer: 59 | Admitting: Women's Health

## 2015-09-24 ENCOUNTER — Ambulatory Visit: Payer: 59 | Admitting: Family Medicine

## 2015-10-01 ENCOUNTER — Encounter: Payer: Self-pay | Admitting: Women's Health

## 2015-10-01 ENCOUNTER — Ambulatory Visit (INDEPENDENT_AMBULATORY_CARE_PROVIDER_SITE_OTHER): Payer: 59 | Admitting: Women's Health

## 2015-10-01 VITALS — BP 190/115 | HR 72 | Wt 215.0 lb

## 2015-10-01 DIAGNOSIS — Z30432 Encounter for removal of intrauterine contraceptive device: Secondary | ICD-10-CM

## 2015-10-01 DIAGNOSIS — I10 Essential (primary) hypertension: Secondary | ICD-10-CM | POA: Diagnosis not present

## 2015-10-01 MED ORDER — AMLODIPINE BESYLATE 5 MG PO TABS: 5.0000 mg | ORAL_TABLET | Freq: Every day | ORAL | Status: DC

## 2015-10-01 NOTE — Progress Notes (Addendum)
Patient ID: [name], female   DOB: 02/20/1971, 44 y.o.   MRN: [id number] FRANKYE SCHWEGEL is a 44 y.o. year old [id number] Caucasian female who presents for removal of a Mirena IUD. Her Mirena IUD was placed 07/24/14 for management of menorrhagia, has had BTL. Pt states prior to IUD periods lasted 7+ days and had to wear super tampon & pad. Has had constant bleeding since IUD placed, is lighter than prior to IUD, but bleeds nearly every day.  Had pelvic u/s done 07/02/2014 that showed a small fundal and posterior fibroid. 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 Denies headache, chest pain, feeling bad. Last pap 06/2014 w/ Dr. Roselie Awkward, neg w/ -HRHPV  No LMP recorded. BP 176/108 mmHg  Pulse 72  Wt 215 lb (97.523 kg)  BP recheck app 47mins after IUD removal 190/115 manually by me  Time out was performed.  A graves speculum was placed in the vagina.  The cervix is extremely anterior. The posterior portion of cervix was visualized- was never able to visualize the cervical os- unable to see strings- bimanual performed- able to feel strings and gently pulled them towards vaginal canal- speculum reinserted- strings were then visible and they were grasped and the Mirena was easily removed intact without complications.   Called PCP Dr. Sallee Lange, notified him of bp's- advised to rx amlodipine 5mg  #30 w/ 2RF, pt to get rx today and take 1st dose asap, take another dose in am- and f/u w/ him tomorrow. Pt verbalized understanding.   If heavy bleeding returns and is bothersome, she is to call to make an appointment w/ MD to discuss options  Tawnya Crook CNM, [facility] 10/01/2015 2:06 PM

## 2015-10-01 NOTE — Patient Instructions (Addendum)
If your periods become unbearably heavy again, call to schedule an appointment with one of the doctor's  Take blood pressure pill as soon as you get it and then again in the morning Follow up with Dr. Wolfgang Phoenix tomorrow

## 2015-10-02 ENCOUNTER — Ambulatory Visit (INDEPENDENT_AMBULATORY_CARE_PROVIDER_SITE_OTHER): Payer: 59 | Admitting: Family Medicine

## 2015-10-02 ENCOUNTER — Encounter: Payer: Self-pay | Admitting: Family Medicine

## 2015-10-02 VITALS — BP 150/98 | Ht 66.75 in | Wt 211.5 lb

## 2015-10-02 DIAGNOSIS — R079 Chest pain, unspecified: Secondary | ICD-10-CM | POA: Diagnosis not present

## 2015-10-02 DIAGNOSIS — I10 Essential (primary) hypertension: Secondary | ICD-10-CM | POA: Diagnosis not present

## 2015-10-02 DIAGNOSIS — R5383 Other fatigue: Secondary | ICD-10-CM

## 2015-10-02 NOTE — Patient Instructions (Signed)
DASH Eating Plan  DASH stands for "Dietary Approaches to Stop Hypertension." The DASH eating plan is a healthy eating plan that has been shown to reduce high blood pressure (hypertension). Additional health benefits may include reducing the risk of type 2 diabetes mellitus, heart disease, and stroke. The DASH eating plan may also help with weight loss.  WHAT DO I NEED TO KNOW ABOUT THE DASH EATING PLAN?  For the DASH eating plan, you will follow these general guidelines:  · Choose foods with a percent daily value for sodium of less than 5% (as listed on the food label).  · Use salt-free seasonings or herbs instead of table salt or sea salt.  · Check with your health care provider or pharmacist before using salt substitutes.  · Eat lower-sodium products, often labeled as "lower sodium" or "no salt added."  · Eat fresh foods.  · Eat more vegetables, fruits, and low-fat dairy products.  · Choose whole grains. Look for the word "whole" as the first word in the ingredient list.  · Choose fish and skinless chicken or turkey more often than red meat. Limit fish, poultry, and meat to 6 oz (170 g) each day.  · Limit sweets, desserts, sugars, and sugary drinks.  · Choose heart-healthy fats.  · Limit cheese to 1 oz (28 g) per day.  · Eat more home-cooked food and less restaurant, buffet, and fast food.  · Limit fried foods.  · Cook foods using methods other than frying.  · Limit canned vegetables. If you do use them, rinse them well to decrease the sodium.  · When eating at a restaurant, ask that your food be prepared with less salt, or no salt if possible.  WHAT FOODS CAN I EAT?  Seek help from a dietitian for individual calorie needs.  Grains  Whole grain or whole wheat bread. Brown rice. Whole grain or whole wheat pasta. Quinoa, bulgur, and whole grain cereals. Low-sodium cereals. Corn or whole wheat flour tortillas. Whole grain cornbread. Whole grain crackers. Low-sodium crackers.  Vegetables  Fresh or frozen vegetables  (raw, steamed, roasted, or grilled). Low-sodium or reduced-sodium tomato and vegetable juices. Low-sodium or reduced-sodium tomato sauce and paste. Low-sodium or reduced-sodium canned vegetables.   Fruits  All fresh, canned (in natural juice), or frozen fruits.  Meat and Other Protein Products  Ground beef (85% or leaner), grass-fed beef, or beef trimmed of fat. Skinless chicken or turkey. Ground chicken or turkey. Pork trimmed of fat. All fish and seafood. Eggs. Dried beans, peas, or lentils. Unsalted nuts and seeds. Unsalted canned beans.  Dairy  Low-fat dairy products, such as skim or 1% milk, 2% or reduced-fat cheeses, low-fat ricotta or cottage cheese, or plain low-fat yogurt. Low-sodium or reduced-sodium cheeses.  Fats and Oils  Tub margarines without trans fats. Light or reduced-fat mayonnaise and salad dressings (reduced sodium). Avocado. Safflower, olive, or canola oils. Natural peanut or almond butter.  Other  Unsalted popcorn and pretzels.  The items listed above may not be a complete list of recommended foods or beverages. Contact your dietitian for more options.  WHAT FOODS ARE NOT RECOMMENDED?  Grains  White bread. White pasta. White rice. Refined cornbread. Bagels and croissants. Crackers that contain trans fat.  Vegetables  Creamed or fried vegetables. Vegetables in a cheese sauce. Regular canned vegetables. Regular canned tomato sauce and paste. Regular tomato and vegetable juices.  Fruits  Dried fruits. Canned fruit in light or heavy syrup. Fruit juice.  Meat and Other Protein   Products  Fatty cuts of meat. Ribs, chicken wings, bacon, sausage, bologna, salami, chitterlings, fatback, hot dogs, bratwurst, and packaged luncheon meats. Salted nuts and seeds. Canned beans with salt.  Dairy  Whole or 2% milk, cream, half-and-half, and cream cheese. Whole-fat or sweetened yogurt. Full-fat cheeses or blue cheese. Nondairy creamers and whipped toppings. Processed cheese, cheese spreads, or cheese  curds.  Condiments  Onion and garlic salt, seasoned salt, table salt, and sea salt. Canned and packaged gravies. Worcestershire sauce. Tartar sauce. Barbecue sauce. Teriyaki sauce. Soy sauce, including reduced sodium. Steak sauce. Fish sauce. Oyster sauce. Cocktail sauce. Horseradish. Ketchup and mustard. Meat flavorings and tenderizers. Bouillon cubes. Hot sauce. Tabasco sauce. Marinades. Taco seasonings. Relishes.  Fats and Oils  Butter, stick margarine, lard, shortening, ghee, and bacon fat. Coconut, palm kernel, or palm oils. Regular salad dressings.  Other  Pickles and olives. Salted popcorn and pretzels.  The items listed above may not be a complete list of foods and beverages to avoid. Contact your dietitian for more information.  WHERE CAN I FIND MORE INFORMATION?  National Heart, Lung, and Blood Institute: www.nhlbi.nih.gov/health/health-topics/topics/dash/     This information is not intended to replace advice given to you by your health care provider. Make sure you discuss any questions you have with your health care provider.     Document Released: 11/19/2011 Document Revised: 12/21/2014 Document Reviewed: 10/04/2013  Elsevier Interactive Patient Education ©2016 Elsevier Inc.

## 2015-10-02 NOTE — Progress Notes (Signed)
   Subjective:    Patient ID: [name], female    DOB: Nov 12, 1971, 44 y.o.   MRN: [id number]  Hypertension This is a recurrent problem. The current episode started more than 1 year ago. The problem is unchanged. Associated symptoms include chest pain and headaches. There are no associated agents to hypertension. There are no known risk factors for coronary artery disease. Treatments tried: amlodipine. The current treatment provides mild improvement. There are no compliance problems.    patient was diagnosed with high blood pressure at gynecologist office today discussed it with me I started amlodipine 5 mg daily and instructed to follow-up  Patient states that she has no other concerns at this time.  PMH benign family history negative for HTN Blood pressure yesterday was 170/110 patient had a headache the night today no unilateral numbness or weakness Review of Systems  Constitutional: Negative for activity change, appetite change and fatigue.  HENT: Negative for congestion.   Respiratory: Negative for cough.   Cardiovascular: Positive for chest pain.  Gastrointestinal: Negative for abdominal pain.  Endocrine: Negative for polydipsia and polyphagia.  Neurological: Positive for headaches. Negative for weakness.  Psychiatric/Behavioral: Negative for confusion.   She describes intermittent sharp stabbing pains in the chest no shortness of breath no substernal ache no radiation    Objective:   Physical Exam 25 minutes was spent with the patient. Greater than half the time was spent in discussion and answering questions and counseling regarding the issues that the patient came in for today.  Lungs clear heart regular chest wall mild tenderness left side pulses normal extremities no edema skin warm dry blood pressure elevated best reading 140/96      Assessment & Plan:  HTN continue amlodipine 5 mg daily may need to double the dose next week may need to add ACE inhibitor or  diuretic patient will check blood pressures in send them back to [place] next week  Lab work ordered.  If severe headache chest pressure tightness pain unilateral numbness weakness go to ER immediately  EKG some T-wave changes consistent strength I doubt any type of significant issues. I doubt cardiac issue

## 2015-10-03 LAB — BASIC METABOLIC PANEL
BUN / CREAT RATIO: 15 (ref 9–23)
BUN: 13 mg/dL (ref 6–24)
CO2: 23 mmol/L (ref 18–29)
CREATININE: 0.85 mg/dL (ref 0.57–1.00)
Calcium: 9.2 mg/dL (ref 8.7–10.2)
Chloride: 99 mmol/L (ref 97–106)
GFR calc Af Amer: 96 mL/min/{1.73_m2} (ref >59)
GFR calc non Af Amer: 83 mL/min/{1.73_m2} (ref >59)
GLUCOSE: 100 mg/dL — AB (ref 65–99)
Potassium: 3.9 mmol/L (ref 3.5–5.2)
SODIUM: 139 mmol/L (ref 136–144)

## 2015-10-03 LAB — MICROALBUMIN / CREATININE URINE RATIO
Creatinine, Urine: 192.5 mg/dL
MICROALB/CREAT RATIO: 42.5 mg/g creat — ABNORMAL HIGH (ref 0.0–30.0)
Microalbumin, Urine: 81.9 ug/mL

## 2015-10-03 LAB — TSH: TSH: 1.32 u[IU]/mL (ref 0.450–4.500)

## 2015-10-09 ENCOUNTER — Encounter: Payer: Self-pay | Admitting: Family Medicine

## 2015-10-09 NOTE — Telephone Encounter (Signed)
Your blood pressure overall looks very good. Continue current medication. Your lab results show thyroid where it needs to be but your urine protein slightly higher than what I would like. Plus glucose borderline. I would recommend that you follow-up again in approximately 3-4 weeks to recheck blood pressure here. I also recommend we will do hemoglobin A1c when you come to our office. Finally the week before you, I would like for you to do a 24-hour urine for creatinine clearance and protein. Our staff/nurses to help set this up.

## 2015-10-10 ENCOUNTER — Telehealth: Payer: Self-pay | Admitting: *Deleted

## 2015-10-10 NOTE — Addendum Note (Signed)
Addended by: Ofilia Neas R on: 10/10/2015 04:00 PM   Modules accepted: Orders

## 2015-10-10 NOTE — Telephone Encounter (Signed)
Spoke with pt. Pt had IUD removed on 10/01/15. She had IUD in place for a little over a year and had bleeding the whole time she had IUD. She has had heavy bleeding since IUD was removed.  She is changing tampon every 2 hours and passing clots the size of her palm. No cramping. I spoke with JAG and she advised need to see pt tomorrow. Call transferred to front desk for appt tomorrow. [place]

## 2015-10-11 ENCOUNTER — Ambulatory Visit (INDEPENDENT_AMBULATORY_CARE_PROVIDER_SITE_OTHER): Payer: 59 | Admitting: Adult Health

## 2015-10-11 ENCOUNTER — Encounter: Payer: Self-pay | Admitting: Adult Health

## 2015-10-11 VITALS — BP 148/90 | HR 84 | Ht 66.75 in | Wt 212.5 lb

## 2015-10-11 DIAGNOSIS — N939 Abnormal uterine and vaginal bleeding, unspecified: Secondary | ICD-10-CM | POA: Diagnosis not present

## 2015-10-11 HISTORY — DX: Abnormal uterine and vaginal bleeding, unspecified: N93.9

## 2015-10-11 NOTE — Progress Notes (Signed)
Subjective:     Patient ID: [name], female   DOB: 01-03-1971, 44 y.o.   MRN: [id number]  HPI [name] is a 44 year old white female in complaining of bleeding heavy since IUD about 10 days ago, but has been bleeding almost daily since IUD placed last year.Has always had heavy periods since started at age 55.Has had tubal and got IUD hoping it would help with periods.Yesterday had lots of clots and changed about every 30 minutes, bleeding slowed,better today.Has been on norvasc 10 days for BP and readings good at home and work.She has known fibroid, seen last year on [place].  Review of Systems Patient denies any headaches, hearing loss, fatigue, blurred vision, shortness of breath, chest pain, abdominal pain, problems with bowel movements, urination, or intercourse. No joint pain or mood swings. + bleeding with clots Reviewed past medical,surgical, social and family history. Reviewed medications and allergies.     Objective:   Physical Exam .BP 148/90 mmHg  Pulse 84  Ht 5' 6.75" (1.695 m)  Wt 212 lb 8 oz (96.389 kg)  BMI 33.55 kg/m2   Skin warm and dry.Pelvic: external genitalia is normal in appearance no lesions, vagina: period like blood,urethra has no lesions or masses noted, cervix:porly visualized but looked smooth,non tender, uterus: slightly enlarged, non tender, no masses felt, adnexa: no masses or tenderness noted. Bladder is non tender and no masses felt If having clots use pad not tampon. Discussed getting CBC and US,talked about ablation as option or megace, will wait til after [place] and she agrees.   Assessment:     AUB    Plan:    Push fluids  Check CBC Return in about 4 days for gyn [place] Review handout on AUB and ablation

## 2015-10-11 NOTE — Patient Instructions (Signed)
Dysfunctional Uterine Bleeding Dysfunctional uterine bleeding is abnormal bleeding from the uterus. Dysfunctional uterine bleeding includes:  A period that comes earlier or later than usual.  A period that is lighter, heavier, or has blood clots.  Bleeding between periods.  Skipping one or more periods.  Bleeding after sexual intercourse.  Bleeding after menopause. HOME CARE INSTRUCTIONS  Pay attention to any changes in your symptoms. Follow these instructions to help with your condition: Eating  Eat well-balanced meals. Include foods that are high in iron, such as liver, meat, shellfish, green leafy vegetables, and eggs.  If you become constipated:  Drink plenty of water.  Eat fruits and vegetables that are high in water and fiber, such as spinach, carrots, raspberries, apples, and mango. Medicines  Take over-the-counter and prescription medicines only as told by your health care provider.  Do not change medicines without talking with your health care provider.  Aspirin or medicines that contain aspirin may make the bleeding worse. Do not take those medicines:  During the week before your period.  During your period.  If you were prescribed iron pills, take them as told by your health care provider. Iron pills help to replace iron that your body loses because of this condition. Activity  If you need to change your sanitary pad or tampon more than one time every 2 hours:  Lie in bed with your feet raised (elevated).  Place a cold pack on your lower abdomen.  Rest as much as possible until the bleeding stops or slows down.  Do not try to lose weight until the bleeding has stopped and your blood iron level is back to normal. Other Instructions  For two months, write down:  When your period starts.  When your period ends.  When any abnormal bleeding occurs.  What problems you notice.  Keep all follow up visits as told by your health care provider. This is  important. SEEK MEDICAL CARE IF:  You get light-headed or weak.  You have nausea and vomiting.  You cannot eat or drink without vomiting.  You feel dizzy or have diarrhea while you are taking medicines.  You are taking birth control pills or hormones, and you want to change them or stop taking them. SEEK IMMEDIATE MEDICAL CARE IF:  You develop a fever or chills.  You need to change your sanitary pad or tampon more than one time per hour.  Your bleeding becomes heavier, or your flow contains clots more often.  You develop pain in your abdomen.  You lose consciousness.  You develop a rash.   This information is not intended to replace advice given to you by your health care provider. Make sure you discuss any questions you have with your health care provider.   Document Released: 11/27/2000 Document Revised: 08/21/2015 Document Reviewed: 02/25/2015 Elsevier Interactive Patient Education 2016 Elsevier Inc. [place] next week

## 2015-10-12 LAB — CBC
HEMOGLOBIN: 13.3 g/dL (ref 11.1–15.9)
Hematocrit: 39 % (ref 34.0–46.6)
MCH: 29.7 pg (ref 26.6–33.0)
MCHC: 34.1 g/dL (ref 31.5–35.7)
MCV: 87 fL (ref 79–97)
PLATELETS: 335 10*3/uL (ref 150–379)
RBC: 4.48 x10E6/uL (ref 3.77–5.28)
RDW: 14.1 % (ref 12.3–15.4)
WBC: 8.6 10*3/uL (ref 3.4–10.8)

## 2015-10-15 ENCOUNTER — Ambulatory Visit (INDEPENDENT_AMBULATORY_CARE_PROVIDER_SITE_OTHER): Payer: 59

## 2015-10-15 DIAGNOSIS — N939 Abnormal uterine and vaginal bleeding, unspecified: Secondary | ICD-10-CM | POA: Diagnosis not present

## 2015-10-15 NOTE — Progress Notes (Signed)
PELVIS TA/TV: heterogenous retroverted uterus with mult fibroids,(#1) ant mid/fundal intramural fibroid 3.1 x 2.4 x 3 cm, (#2) post intramural fibroid 1.5 x 1.3 x 1.2cm,EEC 4.23mm,normal ov's bilat (mobile),no free fluid seen,no pain during ultrasound

## 2015-10-16 ENCOUNTER — Telehealth: Payer: Self-pay | Admitting: Adult Health

## 2015-10-16 ENCOUNTER — Telehealth: Payer: Self-pay | Admitting: *Deleted

## 2015-10-16 NOTE — Telephone Encounter (Signed)
Pt aware of [place] results, will make appt to discuss ablation with MD

## 2015-10-16 NOTE — Telephone Encounter (Signed)
Left message about [place] and to call me back, consider ablation

## 2015-10-17 ENCOUNTER — Encounter: Payer: Self-pay | Admitting: Family Medicine

## 2015-10-22 ENCOUNTER — Ambulatory Visit: Payer: Self-pay | Admitting: Obstetrics & Gynecology

## 2015-11-12 ENCOUNTER — Telehealth: Payer: Self-pay | Admitting: *Deleted

## 2015-11-12 NOTE — Telephone Encounter (Signed)
Pt states she was seen for chest pain and had an EKG and pt states you were going to talk with a cardiologist and get back with her. She states chest pain is the same but more steady now.

## 2015-11-13 NOTE — Telephone Encounter (Signed)
Pt called back today to check on this message

## 2015-11-14 NOTE — Telephone Encounter (Signed)
Her EKG shows what appears to be strain pattern it does not sound like the patient have any angina but with her ongoing chest pain and concerns I believe seen cardiology is reasonable. See previous office visit

## 2015-11-14 NOTE — Telephone Encounter (Signed)
Awaiting cardiology input, nurses please more input

## 2015-11-14 NOTE — Telephone Encounter (Signed)
Spoke with patient and informed her per [place] cardiology input. Patient verbalized understanding and stated that she has an appointment with cardiology tomorrow at 2pm. Discussed with patient description on Chest pain. Patient stated that it feels like a pinch, like someone is pinching chest with about 3 fingers. States chest discomfort occurs about 3 to 4 times an hour. No SOB, just really bad pain.

## 2015-11-15 ENCOUNTER — Ambulatory Visit (INDEPENDENT_AMBULATORY_CARE_PROVIDER_SITE_OTHER): Payer: 59 | Admitting: Cardiology

## 2015-11-15 ENCOUNTER — Encounter: Payer: Self-pay | Admitting: Cardiology

## 2015-11-15 VITALS — BP 128/94 | HR 94 | Ht 66.0 in | Wt 214.0 lb

## 2015-11-15 DIAGNOSIS — R011 Cardiac murmur, unspecified: Secondary | ICD-10-CM

## 2015-11-15 DIAGNOSIS — R0789 Other chest pain: Secondary | ICD-10-CM

## 2015-11-15 NOTE — Progress Notes (Signed)
Patient ID: KULSOOM BYSTROM, female   DOB: 1971/07/06, 44 y.o.   MRN: [id number]     Clinical Summary Ms. Nancie Neas is a 44 y.o.female seen today as a new patient for chest pain  1. Chest pain - reports severe left sided sharp pain between ages 54-26 yo. Symptoms resolved in her late 69s.  - recent chest pain for about 2-3 months. Cramping/pinching pain left chest under left breat, varies in severity 5-7/10. No other associated symptoms. Can occur at rest or with exertion. Not positional. Lasts 1-2 minutes, but can happen multiple times in a day - denies any SOB, no DOE. No LE edema    - CAD risk factors: HTN, maternal grandfather MI early 74s, maternal grandmother in 36s.     Past Medical History  Diagnosis Date  . Edema   . Hypothyroidism   . Allergy   . History of pneumonia   . Iron deficiency anemia   . Chronic headache   . Depression   . Anxiety   . Joint pain   . ADD (attention deficit disorder)   . Thyroid nodule     ultrasound 10/13  . Heavy periods   . Vitamin D deficiency   . Abnormal uterine bleeding (AUB) 10/11/2015     No Known Allergies   Current Outpatient Prescriptions  Medication Sig Dispense Refill  . amLODipine (NORVASC) 5 MG tablet Take 1 tablet (5 mg total) by mouth daily. 30 tablet 2  . levothyroxine (SYNTHROID) 200 MCG tablet Take 1 tablet (200 mcg total) by mouth daily before breakfast. 90 tablet 3   No current facility-administered medications for this visit.     Past Surgical History  Procedure Laterality Date  . Wisdom tooth extraction    . Tubal ligation       No Known Allergies    Family History  Problem Relation Age of Onset  . Other Son     hx/o guillian barre  . Cancer Neg Hx   . Diabetes Neg Hx   . Heart disease Maternal Grandmother   . Heart disease Maternal Grandfather   . Stroke Paternal Grandmother   . Stroke Paternal Grandfather      Social History Ms. Nancie Neas reports that she has never smoked.  She has never used smokeless tobacco. Ms. Nancie Neas reports that she drinks about 1.8 oz of alcohol per week.   Review of Systems CONSTITUTIONAL: No weight loss, fever, chills, weakness or fatigue.  HEENT: Eyes: No visual loss, blurred vision, double vision or yellow sclerae.No hearing loss, sneezing, congestion, runny nose or sore throat.  SKIN: No rash or itching.  CARDIOVASCULAR: per hpi RESPIRATORY: No shortness of breath, cough or sputum.  GASTROINTESTINAL: No anorexia, nausea, vomiting or diarrhea. No abdominal pain or blood.  GENITOURINARY: No burning on urination, no polyuria NEUROLOGICAL: No headache, dizziness, syncope, paralysis, ataxia, numbness or tingling in the extremities. No change in bowel or bladder control.  MUSCULOSKELETAL: No muscle, back pain, joint pain or stiffness.  LYMPHATICS: No enlarged nodes. No history of splenectomy.  PSYCHIATRIC: No history of depression or anxiety.  ENDOCRINOLOGIC: No reports of sweating, cold or heat intolerance. No polyuria or polydipsia.  Marland Kitchen   Physical Examination Filed Vitals:   11/15/15 1351  BP: 128/94  Pulse: 94   Filed Vitals:   11/15/15 1351  Height: 5\' 6"  (1.676 m)  Weight: 214 lb (97.07 kg)    Gen: resting comfortably, no acute distress HEENT: no scleral icterus, pupils equal round and reactive, no palptable  cervical adenopathy,  CV: RRR, 2/6 systolic murmur at apex, no jvd Resp: Clear to auscultation bilaterally GI: abdomen is soft, non-tender, non-distended, normal bowel sounds, no hepatosplenomegaly MSK: extremities are warm, no edema.  Skin: warm, no rash Neuro:  no focal deficits Psych: appropriate affect     Assessment and Plan  1. Chest pain - atypical chest pain, suggestive of possible MSK cause.  - EKG suggestive of LV strain pattern.  - will start ibuprofen 400mg  tid x 7 days and follow symptoms - will LV strain pattern and heart murmur obtain echo  2. Heart murmur - she reports remote history  of MVP, echo nearly 20 years ago - murmur on exam, will obtain echo   F/u 1 month      Arnoldo Lenis, M.D.

## 2015-11-15 NOTE — Patient Instructions (Signed)
Your physician recommends that you schedule a follow-up appointment in: 1 month with Dr Harl Bowie   Your physician has requested that you have an echocardiogram. Echocardiography is a painless test that uses sound waves to create images of your heart. It provides your doctor with information about the size and shape of your heart and how well your heart's chambers and valves are working. This procedure takes approximately one hour. There are no restrictions for this procedure.    Take Motrin 400 mg (with full stomach)  three times a day for 7 days   If you need a refill on your cardiac medications before your next appointment, please call your pharmacy.    Thank you for choosing [facility] !

## 2015-11-20 ENCOUNTER — Ambulatory Visit (HOSPITAL_COMMUNITY)
Admission: RE | Admit: 2015-11-20 | Discharge: 2015-11-20 | Disposition: A | Discharge status: Home / Self Care | Payer: 59 | Source: Ambulatory Visit | Attending: Cardiology | Admitting: Cardiology

## 2015-11-20 DIAGNOSIS — I10 Essential (primary) hypertension: Secondary | ICD-10-CM | POA: Diagnosis not present

## 2015-11-20 DIAGNOSIS — R011 Cardiac murmur, unspecified: Secondary | ICD-10-CM

## 2015-12-10 ENCOUNTER — Ambulatory Visit: Payer: 59 | Admitting: Family Medicine

## 2015-12-27 ENCOUNTER — Ambulatory Visit: Payer: 59 | Admitting: Cardiology

## 2016-01-07 MED FILL — AMLODIPINE BESYLATE 5 MG TA: 5 | 30 days supply | Qty: 30 | Fill #0

## 2016-02-26 ENCOUNTER — Telehealth: Payer: Self-pay | Admitting: Family Medicine

## 2016-02-26 MED ORDER — AMLODIPINE BESYLATE 5 MG PO TABS: 5.0000 mg | ORAL_TABLET | Freq: Every day | ORAL | Status: DC

## 2016-02-26 MED FILL — AMLODIPINE BESYLATE 5 MG TA: 5 | 90 days supply | Qty: 90 | Fill #0

## 2016-02-26 NOTE — Telephone Encounter (Signed)
May have 90 day and 1 refill, needs ov by June

## 2016-02-26 NOTE — Telephone Encounter (Signed)
Pt is needing a refill on her amLODipine (NORVASC) 5 MG tablet.     [facility]

## 2016-02-26 NOTE — Telephone Encounter (Signed)
We have not prescribed this in the past -Dr Gertie Exon

## 2016-02-26 NOTE — Telephone Encounter (Signed)
Rx sent electronically to pharmacy. Patient notified. 

## 2016-03-10 ENCOUNTER — Ambulatory Visit: Payer: 59 | Admitting: Family Medicine

## 2016-04-13 MED FILL — LEVOTHYROXINE 200 MCG TAB: 200 | 90 days supply | Qty: 90 | Fill #1

## 2016-10-21 ENCOUNTER — Ambulatory Visit (INDEPENDENT_AMBULATORY_CARE_PROVIDER_SITE_OTHER): Payer: 59 | Admitting: Family Medicine

## 2016-10-21 ENCOUNTER — Encounter: Payer: Self-pay | Admitting: Family Medicine

## 2016-10-21 VITALS — BP 155/106 | HR 84 | Temp 98.7°F | Resp 18 | Ht 66.25 in | Wt 213.0 lb

## 2016-10-21 DIAGNOSIS — E038 Other specified hypothyroidism: Secondary | ICD-10-CM | POA: Diagnosis not present

## 2016-10-21 DIAGNOSIS — I10 Essential (primary) hypertension: Secondary | ICD-10-CM

## 2016-10-21 DIAGNOSIS — Z7689 Persons encountering health services in other specified circumstances: Secondary | ICD-10-CM

## 2016-10-21 DIAGNOSIS — I517 Cardiomegaly: Secondary | ICD-10-CM | POA: Insufficient documentation

## 2016-10-21 DIAGNOSIS — R922 Inconclusive mammogram: Secondary | ICD-10-CM | POA: Insufficient documentation

## 2016-10-21 DIAGNOSIS — I340 Nonrheumatic mitral (valve) insufficiency: Secondary | ICD-10-CM

## 2016-10-21 DIAGNOSIS — E785 Hyperlipidemia, unspecified: Secondary | ICD-10-CM

## 2016-10-21 HISTORY — DX: Hyperlipidemia, unspecified: E78.5

## 2016-10-21 MED ORDER — LISINOPRIL-HYDROCHLOROTHIAZIDE 10-12.5 MG PO TABS: 1.0000 | ORAL_TABLET | Freq: Every day | ORAL | 3 refills | Status: DC

## 2016-10-21 MED FILL — LISINOPRIL-HCTZ 10-12.5 MG: 10-12.5 | 90 days supply | Qty: 90 | Fill #0

## 2016-10-21 NOTE — Patient Instructions (Signed)
Continue to walk every day I am putting you on lisinopril for your BP It has some HCTZ in it for fluid Take once a day Let me know what your blood pressure runs in a couple of weeks  I have ordered lab tests I will send you a letter with your test results.  If there is anything of concern, we will call right away.  Mammogram ordered  See me in 6 months

## 2016-10-21 NOTE — Progress Notes (Signed)
Chief Complaint  Patient presents with  . Establish Care   New to establish Is not up to date with healthcare Has been diagnosed with hypertension and was previously on norvasc.  She is in denial and does not want to take med or believe she has high Bp.  Her BP is high today.  I reviewed her old chart and on her echocardiogram in 2016, she has early LVH.  I told her she definitely has high blood pressure and that not treating it is affecting her heart.  She didn't like the norvasc, so I will try something else.  She agrees to take it daily and call me in 2 weeks with BP readings She sees GYN for regular care.  PAP up to date.  Mammogram done years ago but had dense breasts and has not been repeated.  Is advised to get this done yearly.  Has regular periods, but has heavy bleeding  Past history anxiety and depression and ADD - but not on meds now. We discussed diet and exercise to lose weight  Patient Active Problem List   Diagnosis Date Noted  . Dense breast tissue on mammogram 10/21/2016  . HLD (hyperlipidemia) 10/21/2016  . Abnormal uterine bleeding (AUB) 10/11/2015  . Hypertension 10/01/2015  . Menorrhagia with regular cycle 06/26/2014  . ADD (attention deficit disorder) 02/18/2012  . Hypothyroidism 12/30/2011    Outpatient Encounter Prescriptions as of 10/21/2016  Medication Sig  . levothyroxine (SYNTHROID) 200 MCG tablet Take 1 tablet (200 mcg total) by mouth daily before breakfast.  . lisinopril-hydrochlorothiazide (PRINZIDE,ZESTORETIC) 10-12.5 MG tablet Take 1 tablet by mouth daily.  . [DISCONTINUED] amLODipine (NORVASC) 5 MG tablet Take 1 tablet (5 mg total) by mouth daily. (Patient not taking: Reported on 10/21/2016)   No facility-administered encounter medications on file as of 10/21/2016.     Past Medical History:  Diagnosis Date  . Abnormal uterine bleeding (AUB) 10/11/2015  . ADD (attention deficit disorder)   . Anxiety   . Arthritis   . Chronic headache   .  Depression   . Depression with anxiety 12/30/2011  . Edema   . GERD (gastroesophageal reflux disease)   . Heart murmur   . Heavy periods   . History of pneumonia   . HLD (hyperlipidemia) 10/21/2016  . Hypertension   . Hypothyroidism   . Iron deficiency anemia   . Joint pain   . Thyroid nodule    ultrasound 10/13  . Vitamin D deficiency     Past Surgical History:  Procedure Laterality Date  . TUBAL LIGATION  ~2000  . WISDOM TOOTH EXTRACTION      Social History   Social History  . Marital status: Married    Spouse name: Legrand Como  . Number of children: 2  . Years of education: 14   Occupational History  . 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 Smoking status: Never Smoker  . Smokeless tobacco: Never Used  . Alcohol use 1.8 oz/week    3 Glasses of wine per week     Comment: 3-4 times weekly  . Drug use: No  . Sexual activity: Yes    Birth control/ protection: Surgical     Comment: tubal   Other Topics Concern  . Not on file   Social History Narrative   Recently married to Legrand Como   4 children between them   One son at home    Family History  Problem  Relation Age of Onset  . Other Son     hx/o guillian barre  . Hearing loss Father   . Heart disease Maternal Grandmother   . Heart disease Maternal Grandfather   . Stroke Paternal Grandmother   . Stroke Paternal Grandfather   . Cancer Neg Hx   . Diabetes Neg Hx     Review of Systems  Constitutional: Negative for chills, fever and weight loss.  HENT: Negative for congestion and hearing loss.   Eyes: Negative for blurred vision and pain.  Respiratory: Negative for cough and shortness of breath.   Cardiovascular: Negative for chest pain and leg swelling.  Gastrointestinal: Negative for abdominal pain, constipation, diarrhea and heartburn.  Genitourinary: Negative for dysuria and frequency.  Musculoskeletal: Negative for falls, joint pain and myalgias.    Neurological: Negative for dizziness, seizures and headaches.  Psychiatric/Behavioral: Negative for depression. The patient is not nervous/anxious and does not have insomnia.     BP (!) 155/106 (BP Location: Right Arm, Patient Position: Supine)   Pulse 84   Temp 98.7 F (37.1 C) (Oral)   Resp 18   Ht 5' 6.25" (1.683 m)   Wt 213 lb 0.6 oz (96.6 kg)   LMP 10/18/2016 (Exact Date)   SpO2 99%   BMI 34.13 kg/m   Physical Exam  Constitutional: She is oriented to person, place, and time. She appears well-developed and well-nourished.  HENT:  Head: Normocephalic and atraumatic.  Right Ear: External ear normal.  Left Ear: External ear normal.  Mouth/Throat: Oropharynx is clear and moist.  Eyes: Conjunctivae are normal. Pupils are equal, round, and reactive to light.  Neck: Normal range of motion. Neck supple. No thyromegaly present.  Cardiovascular: Normal rate, regular rhythm and normal heart sounds.   Pulmonary/Chest: Effort normal and breath sounds normal. No respiratory distress.  Abdominal: Soft. Bowel sounds are normal.  Musculoskeletal: Normal range of motion. She exhibits no edema.  Lymphadenopathy:    She has no cervical adenopathy.  Neurological: She is alert and oriented to person, place, and time.  Gait normal  Skin: Skin is warm and dry.  Psychiatric: She has a normal mood and affect. Her behavior is normal. Thought content normal.  Nursing note and vitals reviewed.   1. Dense breast tissue on mammogram - MM Digital Screening; Future  2. Essential hypertension - CBC - Comprehensive metabolic panel - Lipid panel - TSH - Urinalysis, Routine w reflex microscopic (not at [facility]) - VITAMIN D 25 Hydroxy (Vit-D Deficiency, Fractures)  3. Other specified hypothyroidism  4. Encounter to establish care with new doctor  5. Hyperlipidemia, unspecified hyperlipidemia type    Patient Instructions  Continue to walk every day I am putting you on lisinopril for your  BP It has some HCTZ in it for fluid Take once a day Let me know what your blood pressure runs in a couple of weeks  I have ordered lab tests I will send you a letter with your test results.  If there is anything of concern, we will call right away.  Mammogram ordered  See me in 6 months    Raylene Everts, MD

## 2017-01-04 ENCOUNTER — Other Ambulatory Visit: Payer: Self-pay

## 2017-01-04 MED ORDER — LEVOTHYROXINE SODIUM 200 MCG PO TABS: 200.0000 ug | ORAL_TABLET | Freq: Every day | ORAL | 0 refills | Status: DC

## 2017-01-04 MED FILL — LEVOTHYROXINE 200 MCG TAB: 200 | 90 days supply | Qty: 90 | Fill #0

## 2017-02-23 MED FILL — LISINOPRIL-HCTZ 10-12.5 MG: 10-12.5 | 90 days supply | Qty: 90 | Fill #1

## 2017-04-20 ENCOUNTER — Ambulatory Visit: Payer: 59 | Admitting: Family Medicine

## 2017-05-11 ENCOUNTER — Other Ambulatory Visit: Payer: Self-pay | Admitting: Family Medicine

## 2017-05-11 DIAGNOSIS — Z1231 Encounter for screening mammogram for malignant neoplasm of breast: Secondary | ICD-10-CM

## 2017-05-11 DIAGNOSIS — I10 Essential (primary) hypertension: Secondary | ICD-10-CM

## 2017-05-11 DIAGNOSIS — R922 Inconclusive mammogram: Secondary | ICD-10-CM

## 2017-05-11 DIAGNOSIS — E038 Other specified hypothyroidism: Secondary | ICD-10-CM

## 2017-05-11 DIAGNOSIS — E785 Hyperlipidemia, unspecified: Secondary | ICD-10-CM

## 2017-05-11 DIAGNOSIS — Z7689 Persons encountering health services in other specified circumstances: Secondary | ICD-10-CM

## 2017-05-14 ENCOUNTER — Ambulatory Visit
Admission: RE | Admit: 2017-05-14 | Discharge: 2017-05-14 | Disposition: A | Discharge status: Home / Self Care | Payer: 59 | Source: Ambulatory Visit | Attending: Family Medicine | Admitting: Family Medicine

## 2017-05-14 DIAGNOSIS — E038 Other specified hypothyroidism: Secondary | ICD-10-CM

## 2017-05-14 DIAGNOSIS — Z1231 Encounter for screening mammogram for malignant neoplasm of breast: Secondary | ICD-10-CM | POA: Diagnosis not present

## 2017-05-14 DIAGNOSIS — R922 Inconclusive mammogram: Secondary | ICD-10-CM

## 2017-05-14 DIAGNOSIS — Z7689 Persons encountering health services in other specified circumstances: Secondary | ICD-10-CM

## 2017-05-14 DIAGNOSIS — I10 Essential (primary) hypertension: Secondary | ICD-10-CM

## 2017-05-14 DIAGNOSIS — E785 Hyperlipidemia, unspecified: Secondary | ICD-10-CM

## 2017-05-20 ENCOUNTER — Ambulatory Visit: Payer: 59 | Admitting: Family Medicine

## 2017-05-27 ENCOUNTER — Telehealth: Payer: Self-pay

## 2017-05-27 DIAGNOSIS — E785 Hyperlipidemia, unspecified: Secondary | ICD-10-CM | POA: Diagnosis not present

## 2017-05-27 DIAGNOSIS — I10 Essential (primary) hypertension: Secondary | ICD-10-CM

## 2017-05-27 DIAGNOSIS — E038 Other specified hypothyroidism: Secondary | ICD-10-CM

## 2017-05-27 LAB — TSH: TSH: 0.16 mIU/L — ABNORMAL LOW

## 2017-05-27 LAB — COMPREHENSIVE METABOLIC PANEL
ALBUMIN: 4.2 g/dL (ref 3.6–5.1)
ALT: 137 U/L — ABNORMAL HIGH (ref 6–29)
AST: 79 U/L — ABNORMAL HIGH (ref 10–35)
Alkaline Phosphatase: 105 U/L (ref 33–115)
BUN: 18 mg/dL (ref 7–25)
CALCIUM: 9.1 mg/dL (ref 8.6–10.2)
CHLORIDE: 103 mmol/L (ref 98–110)
CO2: 22 mmol/L (ref 20–31)
Creat: 0.86 mg/dL (ref 0.50–1.10)
GLUCOSE: 109 mg/dL — AB (ref 65–99)
POTASSIUM: 4 mmol/L (ref 3.5–5.3)
Sodium: 137 mmol/L (ref 135–146)
Total Bilirubin: 0.3 mg/dL (ref 0.2–1.2)
Total Protein: 6.6 g/dL (ref 6.1–8.1)

## 2017-05-27 LAB — CBC
HEMATOCRIT: 33.8 % — AB (ref 35.0–45.0)
HEMOGLOBIN: 10.5 g/dL — AB (ref 11.7–15.5)
MCH: 23.2 pg — AB (ref 27.0–33.0)
MCHC: 31.1 g/dL — AB (ref 32.0–36.0)
MCV: 74.6 fL — AB (ref 80.0–100.0)
MPV: 8.8 fL (ref 7.5–12.5)
Platelets: 378 10*3/uL (ref 140–400)
RBC: 4.53 MIL/uL (ref 3.80–5.10)
RDW: 18 % — AB (ref 11.0–15.0)
WBC: 5 10*3/uL (ref 3.8–10.8)

## 2017-05-27 LAB — LIPID PANEL
Cholesterol: 176 mg/dL (ref <200)
HDL: 31 mg/dL — ABNORMAL LOW (ref >50)
LDL Cholesterol: 112 mg/dL — ABNORMAL HIGH (ref <100)
Total CHOL/HDL Ratio: 5.7 Ratio — ABNORMAL HIGH (ref <5.0)
Triglycerides: 163 mg/dL — ABNORMAL HIGH (ref <150)
VLDL: 33 mg/dL — AB (ref <30)

## 2017-05-27 NOTE — Telephone Encounter (Signed)
Labs re-ordered

## 2017-05-28 LAB — URINALYSIS, ROUTINE W REFLEX MICROSCOPIC
Bilirubin Urine: NEGATIVE
Glucose, UA: NEGATIVE
HGB URINE DIPSTICK: NEGATIVE
Leukocytes, UA: NEGATIVE
NITRITE: NEGATIVE
Protein, ur: NEGATIVE
Specific Gravity, Urine: 1.027 (ref 1.001–1.035)
pH: 6 (ref 5.0–8.0)

## 2017-05-28 LAB — VITAMIN D 25 HYDROXY (VIT D DEFICIENCY, FRACTURES): VIT D 25 HYDROXY: 24 ng/mL — AB (ref 30–100)

## 2017-05-31 ENCOUNTER — Other Ambulatory Visit: Payer: Self-pay

## 2017-05-31 ENCOUNTER — Ambulatory Visit (INDEPENDENT_AMBULATORY_CARE_PROVIDER_SITE_OTHER): Payer: 59 | Admitting: Family Medicine

## 2017-05-31 ENCOUNTER — Encounter: Payer: Self-pay | Admitting: Family Medicine

## 2017-05-31 ENCOUNTER — Ambulatory Visit (HOSPITAL_COMMUNITY)
Admission: RE | Admit: 2017-05-31 | Discharge: 2017-05-31 | Disposition: A | Discharge status: Home / Self Care | Payer: 59 | Source: Ambulatory Visit | Attending: Family Medicine | Admitting: Family Medicine

## 2017-05-31 VITALS — BP 124/80 | HR 76 | Temp 98.0°F | Resp 16 | Ht 66.0 in | Wt 214.0 lb

## 2017-05-31 DIAGNOSIS — D500 Iron deficiency anemia secondary to blood loss (chronic): Secondary | ICD-10-CM | POA: Diagnosis not present

## 2017-05-31 DIAGNOSIS — E038 Other specified hypothyroidism: Secondary | ICD-10-CM

## 2017-05-31 DIAGNOSIS — E785 Hyperlipidemia, unspecified: Secondary | ICD-10-CM

## 2017-05-31 DIAGNOSIS — I499 Cardiac arrhythmia, unspecified: Secondary | ICD-10-CM | POA: Insufficient documentation

## 2017-05-31 DIAGNOSIS — I10 Essential (primary) hypertension: Secondary | ICD-10-CM | POA: Diagnosis not present

## 2017-05-31 DIAGNOSIS — S39012A Strain of muscle, fascia and tendon of lower back, initial encounter: Secondary | ICD-10-CM | POA: Diagnosis not present

## 2017-05-31 DIAGNOSIS — N939 Abnormal uterine and vaginal bleeding, unspecified: Secondary | ICD-10-CM | POA: Diagnosis not present

## 2017-05-31 MED ORDER — LEVOTHYROXINE SODIUM 175 MCG PO TABS: 175.0000 ug | ORAL_TABLET | Freq: Every day | ORAL | 3 refills | Status: DC

## 2017-05-31 MED ORDER — LISINOPRIL-HYDROCHLOROTHIAZIDE 10-12.5 MG PO TABS: 1.0000 | ORAL_TABLET | Freq: Every day | ORAL | 3 refills | Status: DC

## 2017-05-31 MED ORDER — CYCLOBENZAPRINE HCL 5 MG PO TABS: 5.0000 mg | ORAL_TABLET | Freq: Three times a day (TID) | ORAL | 1 refills | Status: DC | PRN

## 2017-05-31 MED FILL — LISINOPRIL-HCTZ 10-12.5 MG: 10-12.5 | 90 days supply | Qty: 90 | Fill #0

## 2017-05-31 MED FILL — LEVOTHYROXINE 175 MCG TABLE: 175 | 90 days supply | Qty: 90 | Fill #0

## 2017-05-31 MED FILL — CYCLOBENZAPRINE 5 MG TABLET: 5 | 10 days supply | Qty: 30 | Fill #0

## 2017-05-31 NOTE — Patient Instructions (Signed)
You need to take an iron supplement for the anemia  I am ordering a Holter monitor for the irregular heartbeat This will measure the heartbeats for 24 hours  I am reducing the synthroid to 175 mcg a day Need repeat TSH blood test in 8 weeks  Congratulations on your healthy eating and weight loss  Need routine labs and follow up in 6 months

## 2017-05-31 NOTE — Progress Notes (Signed)
Chief Complaint  Patient presents with  . Follow-up    lab rev   Routine follow up and lab review. She has hypothyroidism on 2000 mcg of synthroid daily.  Her TSH is low, so I am adjusting the dose.  In addition she reports increased incidence of irregular heartbeats. She is on a new ketogenic diet and losing weight She has menorrhagia and has anemia.  Not unlike prior counts. Mild increase in LFTs   Declines abdominal ultrasound, has been told it is fatty liver.  Will follow Is vitamin D deficient and will take supplement Has low HDL - will increase exercise with her diet and follow.  Her calculated Framingham risk is 5.5 % She requests a Rx for flexeril to take at night for some low back muscular pain.  Patient Active Problem List   Diagnosis Date Noted  . Dense breast tissue on mammogram 10/21/2016  . HLD (hyperlipidemia) 10/21/2016  . LVH (left ventricular hypertrophy) 10/21/2016  . Mitral regurgitation 10/21/2016  . Abnormal uterine bleeding (AUB) 10/11/2015  . Hypertension 10/01/2015  . Menorrhagia with regular cycle 06/26/2014  . ADD (attention deficit disorder) 02/18/2012  . Hypothyroidism 12/30/2011    Outpatient Encounter Prescriptions as of 05/31/2017  Medication Sig  . levothyroxine (SYNTHROID, LEVOTHROID) 175 MCG tablet Take 1 tablet (175 mcg total) by mouth daily before breakfast.  . lisinopril-hydrochlorothiazide (PRINZIDE,ZESTORETIC) 10-12.5 MG tablet Take 1 tablet by mouth daily.  . cyclobenzaprine (FLEXERIL) 5 MG tablet Take 1 tablet (5 mg total) by mouth 3 (three) times daily as needed for muscle spasms.   No facility-administered encounter medications on file as of 05/31/2017.     No Known Allergies  Review of Systems  Constitutional: Negative for activity change, appetite change and unexpected weight change.  HENT: Negative for congestion, dental problem, postnasal drip and rhinorrhea.   Eyes: Negative for redness and visual disturbance.    Respiratory: Negative for cough and shortness of breath.   Cardiovascular: Negative for chest pain and leg swelling.  Gastrointestinal: Negative for abdominal pain, constipation and diarrhea.  Genitourinary: Positive for vaginal bleeding. Negative for difficulty urinating, frequency and menstrual problem.  Musculoskeletal: Positive for back pain. Negative for arthralgias.  Neurological: Negative for dizziness and headaches.  Psychiatric/Behavioral: Negative for dysphoric mood and sleep disturbance. The patient is not nervous/anxious.     BP 124/80 (BP Location: Right Arm, Patient Position: Sitting, Cuff Size: Normal)   Pulse 76   Temp 98 F (36.7 C) (Temporal)   Resp 16   Ht 5\' 6"  (1.676 m)   Wt 214 lb 0.6 oz (97.1 kg)   LMP 05/21/2017 (Exact Date)   SpO2 99%   BMI 34.55 kg/m   Physical Exam  Constitutional: She is oriented to person, place, and time. She appears well-developed and well-nourished.  HENT:  Head: Normocephalic and atraumatic.  Right Ear: External ear normal.  Left Ear: External ear normal.  Mouth/Throat: Oropharynx is clear and moist.  Eyes: Conjunctivae are normal. Pupils are equal, round, and reactive to light.  Neck: Normal range of motion. Neck supple. No thyromegaly present.  Cardiovascular: Normal rate, regular rhythm and normal heart sounds.   Frequent ectopy.  EKG shows PVCs  Pulmonary/Chest: Effort normal and breath sounds normal. No respiratory distress.  Abdominal: Soft. Bowel sounds are normal.  Musculoskeletal: Normal range of motion. She exhibits no edema.  Lymphadenopathy:    She has no cervical adenopathy.  Neurological: She is alert and oriented to person, place, and time.  Gait  normal  Skin: Skin is warm and dry.  Psychiatric: She has a normal mood and affect. Her behavior is normal. Thought content normal.  Nursing note and vitals reviewed.   ASSESSMENT/PLAN:  1. Essential hypertension controlled - CBC - COMPLETE METABOLIC PANEL  WITH GFR - Urinalysis, Routine w reflex microscopic  2. Other specified hypothyroidism Decrease synthroid - TSH  3. Abnormal uterine bleeding (AUB) anemia  4. Hyperlipidemia, unspecified hyperlipidemia type Will try diet and exercise - Lipid panel  5. Iron deficiency anemia due to chronic blood loss  6. Irregular heart beats PVCs - Holter monitor - 24 hour; Future  7. Strain of lumbar region, initial encounter Advil, warmth, flexeril prn   Patient Instructions  You need to take an iron supplement for the anemia  I am ordering a Holter monitor for the irregular heartbeat This will measure the heartbeats for 24 hours  I am reducing the synthroid to 175 mcg a day Need repeat TSH blood test in 8 weeks  Congratulations on your healthy eating and weight loss  Need routine labs and follow up in 6 months   Raylene Everts, MD

## 2017-06-03 ENCOUNTER — Telehealth: Payer: Self-pay | Admitting: *Deleted

## 2017-06-03 NOTE — Telephone Encounter (Signed)
Called Garlene.Marland [place].

## 2017-06-03 NOTE — Telephone Encounter (Signed)
These are read by the cardiologist and take a few days.  Not available yet

## 2017-06-03 NOTE — Telephone Encounter (Signed)
Patient called left message on nurses line requesting the results from her 24 hour Holter monitor. Please call patient at [phone]

## 2017-06-09 ENCOUNTER — Telehealth: Payer: Self-pay | Admitting: Family Medicine

## 2017-06-09 NOTE — Telephone Encounter (Signed)
I have called and left a message for cardiology about this.

## 2017-06-09 NOTE — Telephone Encounter (Signed)
Not done.  Call cardiology for status

## 2017-06-09 NOTE — Telephone Encounter (Signed)
I have called pt to update her on this.

## 2017-06-09 NOTE — Telephone Encounter (Signed)
[name] is asking for results of the Holter Monitor test she had done, she is having heart palpitations still, please advise

## 2017-06-10 ENCOUNTER — Other Ambulatory Visit: Payer: Self-pay | Admitting: Family Medicine

## 2017-06-10 DIAGNOSIS — I493 Ventricular premature depolarization: Secondary | ICD-10-CM

## 2017-06-10 NOTE — Telephone Encounter (Signed)
Notified Dr. Meda Coffee office that holter monitor is in epic

## 2017-06-10 NOTE — Telephone Encounter (Signed)
Called and left message for Elynn.

## 2017-06-10 NOTE — Telephone Encounter (Signed)
Released to patient in my chart with message.  Cardiology referral done.

## 2017-10-20 ENCOUNTER — Encounter (INDEPENDENT_AMBULATORY_CARE_PROVIDER_SITE_OTHER): Payer: Self-pay | Admitting: Physician Assistant

## 2017-10-20 ENCOUNTER — Ambulatory Visit (INDEPENDENT_AMBULATORY_CARE_PROVIDER_SITE_OTHER): Payer: 59

## 2017-10-20 ENCOUNTER — Ambulatory Visit (INDEPENDENT_AMBULATORY_CARE_PROVIDER_SITE_OTHER): Payer: 59 | Admitting: Physician Assistant

## 2017-10-20 DIAGNOSIS — G8929 Other chronic pain: Secondary | ICD-10-CM

## 2017-10-20 DIAGNOSIS — M25562 Pain in left knee: Secondary | ICD-10-CM | POA: Diagnosis not present

## 2017-10-20 NOTE — Progress Notes (Signed)
Office Visit Note   Patient: [name]           Date of Birth: 11/05/1971           MRN: [id number] Visit Date: 10/20/2017              Requested by: Emily Everts, MD [phone], [place] [address] PCP: Emily Everts, MD   Assessment & Plan: Visit Diagnoses:  1. Chronic pain of left knee     Plan: We will have her follow-up with [place] in 2 weeks to check her progress.  She is to be mindful of her mechanical symptoms if any.  Also if the injection gave her any relief.  Discussed with her quad strengthening exercises.  She can continue to take her over-the-counter NSAID.  Follow-Up Instructions: Return in about 2 weeks (around 11/03/2017).   Orders:  Orders Placed This Encounter  Procedures  . Large Joint Inj: L knee  . XR KNEE 3 VIEW LEFT   No orders of the defined types were placed in this encounter.     Procedures: Large Joint Inj: L knee on 10/20/2017 4:44 PM Indications: pain Details: 22 G 1.5 in needle, superolateral approach  Arthrogram: No  Medications: 3 mL lidocaine 1 %; 40 mg methylPREDNISolone acetate 40 MG/ML Aspirate: 10 mL yellow and blood-tinged Outcome: tolerated well, no immediate complications Procedure, treatment alternatives, risks and benefits explained, specific risks discussed. Consent was given by the patient. Immediately prior to procedure a time out was called to verify the correct patient, procedure, equipment, support staff and site/side marked as required. Patient was prepped and draped in the usual sterile fashion.       Clinical Data: No additional findings.   Subjective: Chief Complaint  Patient presents with  . Left Knee - Pain    HPI Ms. [name] has a 46 year old female was seen in the first time comes in today due to left knee pain.  She was walking her dog elaborated on October 5 hit her knee causing her to almost fall.  Since that time she has had pain in the knee.  She states that the knee pain  is better but still present.  She has taken Advil tried elevation and ice.  She is beginning to have a slightly painful popping.  She notes she has had some swelling.  She states pain has been unchanged since last 2 weeks. Review of Systems No fevers chills shortness of breath chest pain.  Objective: Vital Signs: There were no vitals taken for this visit.  Physical Exam  Constitutional: She is oriented to person, place, and time. She appears well-developed and well-nourished. No distress.  Pulmonary/Chest: Effort normal.  Neurological: She is alert and oriented to person, place, and time.  Skin: She is not diaphoretic.  Psychiatric: She has a normal mood and affect.    Ortho Exam Bilateral knees she has good range of motion.  No instability valgus varus stress anterior drawer is negative.  Negative Freddi Starr bilaterally.  However manipulation of the left patella does cause her some discomfort.  Slight effusion left knee.  No abnormal warmth or erythema of either knee.  what he should  Specialty Comments:  No specialty comments available.  Imaging: Xr Knee 3 View Left  Result Date: 10/21/2017 3 views bilateral knees: AP view shows both knees to be well preserved medial lateral joint lines.  No acute fractures no bony abnormalities.  Lateral view of the  left knee and sunrise view of the left knee shows some mild to moderate patellofemoral changes.  There is lateralization of the patella particularly on the sunrise view.  Again no acute fractures.  No bony abnormality    PMFS History: Patient Active Problem List   Diagnosis Date Noted  . Dense breast tissue on mammogram 10/21/2016  . HLD (hyperlipidemia) 10/21/2016  . LVH (left ventricular hypertrophy) 10/21/2016  . Mitral regurgitation 10/21/2016  . Abnormal uterine bleeding (AUB) 10/11/2015  . Hypertension 10/01/2015  . Menorrhagia with regular cycle 06/26/2014  . ADD (attention deficit disorder) 02/18/2012  . Hypothyroidism  12/30/2011   Past Medical History:  Diagnosis Date  . Abnormal uterine bleeding (AUB) 10/11/2015  . ADD (attention deficit disorder)   . Anxiety   . Arthritis   . Chronic headache   . Depression   . Depression with anxiety 12/30/2011  . Edema   . GERD (gastroesophageal reflux disease)   . Heart murmur   . Heavy periods   . History of pneumonia   . HLD (hyperlipidemia) 10/21/2016  . Hypertension   . Hypothyroidism   . Iron deficiency anemia   . Joint pain   . Thyroid nodule    ultrasound 10/13  . Vitamin D deficiency     Family History  Problem Relation Age of Onset  . Other Son        hx/o guillian barre  . Hearing loss Father   . Heart disease Maternal Grandmother   . Heart disease Maternal Grandfather   . Stroke Paternal Grandmother   . Stroke Paternal Grandfather   . Cancer Neg Hx   . Diabetes Neg Hx     Past Surgical History:  Procedure Laterality Date  . TUBAL LIGATION  ~2000  . WISDOM TOOTH EXTRACTION     Social History   Occupational History  . Occupation: Estate agent: [employer]    Comment: [employer]  Tobacco Use  . Smoking status: Never Smoker  . Smokeless tobacco: Never Used  Substance and Sexual Activity  . Alcohol use: Yes    Alcohol/week: 1.8 oz    Types: 3 Glasses of wine per week    Comment: 3-4 times weekly  . Drug use: No  . Sexual activity: Yes    Birth control/protection: Surgical    Comment: tubal

## 2017-10-21 ENCOUNTER — Encounter (INDEPENDENT_AMBULATORY_CARE_PROVIDER_SITE_OTHER): Payer: Self-pay | Admitting: Physician Assistant

## 2017-10-21 MED ORDER — LIDOCAINE HCL 1 % IJ SOLN
3.0000 mL | INTRAMUSCULAR | Status: AC | PRN
  Administered 2017-10-20: 3 mL

## 2017-10-21 MED ORDER — METHYLPREDNISOLONE ACETATE 40 MG/ML IJ SUSP
40.0000 mg | INTRAMUSCULAR | Status: AC | PRN
  Administered 2017-10-20: 40 mg via INTRA_ARTICULAR

## 2017-11-03 ENCOUNTER — Ambulatory Visit (INDEPENDENT_AMBULATORY_CARE_PROVIDER_SITE_OTHER): Payer: 59 | Admitting: Physician Assistant

## 2017-11-03 ENCOUNTER — Encounter (INDEPENDENT_AMBULATORY_CARE_PROVIDER_SITE_OTHER): Payer: Self-pay | Admitting: Physician Assistant

## 2017-11-03 DIAGNOSIS — M25562 Pain in left knee: Secondary | ICD-10-CM | POA: Diagnosis not present

## 2017-11-03 NOTE — Progress Notes (Signed)
Office Visit Note   Patient: [name]           Date of Birth: [birth year]           MRN: [id number] Visit Date: 11/03/2017              Requested by: Raylene Everts, MD [phone] [address] [place], [place] [address] PCP: Raylene Everts, MD   Assessment & Plan: Visit Diagnoses:  1. Acute pain of left knee     Plan: Due to the fact she continues to have severe pain that is becoming worse in her left knee despite conservative treatment including cortisone injection time and now she is having giving way sensation of the knee.  Recommend MRI of the left knee to rule out meniscal tear.  Follow-up after the MRI to go over results and discuss further treatment.  Follow-Up Instructions: Return for After MRI.   Orders:  Orders Placed This Encounter  Procedures  . MR Knee Left w/o contrast   No orders of the defined types were placed in this encounter.     Procedures: No procedures performed   Clinical Data: No additional findings.   Subjective: Chief Complaint  Patient presents with  . Left Knee - Pain, Follow-up    HPI [name] returns today follow-up of her left knee status post aspiration injection on 10/20/2017.  She states that the knee felt a little more sturdy for a few days but the pain never really went away.  Over the last few days her knee pain has became increasingly worse.  She states she cannot trust the knee because it feels like it is going to give way.  She is tried ice elevation naproxen.   Review of Systems No fevers or chills.  Otherwise please see HPI.  Objective: Vital Signs: There were no vitals taken for this visit.  Physical Exam  Constitutional: She is oriented to person, place, and time. She appears well-developed and well-nourished. No distress.  Pulmonary/Chest: Effort normal.  Neurological: She is alert and oriented to person, place, and time.  Skin: She is not diaphoretic.  Psychiatric: She has a normal mood and affect.      Ortho Exam Left knee no effusion or abnormal warmth.  She has full extension full flexion.  Positive McMurray's.  No instability valgus varus stressing.  Tenderness along the medial joint line.  Specialty Comments:  No specialty comments available.  Imaging: No results found.   PMFS History: Patient Active Problem List   Diagnosis Date Noted  . Dense breast tissue on mammogram 10/21/2016  . HLD (hyperlipidemia) 10/21/2016  . LVH (left ventricular hypertrophy) 10/21/2016  . Mitral regurgitation 10/21/2016  . Abnormal uterine bleeding (AUB) 10/11/2015  . Hypertension 10/01/2015  . Menorrhagia with regular cycle 06/26/2014  . ADD (attention deficit disorder) 02/18/2012  . Hypothyroidism 12/30/2011   Past Medical History:  Diagnosis Date  . Abnormal uterine bleeding (AUB) 10/11/2015  . ADD (attention deficit disorder)   . Anxiety   . Arthritis   . Chronic headache   . Depression   . Depression with anxiety 12/30/2011  . Edema   . GERD (gastroesophageal reflux disease)   . Heart murmur   . Heavy periods   . History of pneumonia   . HLD (hyperlipidemia) 10/21/2016  . Hypertension   . Hypothyroidism   . Iron deficiency anemia   . Joint pain   . Thyroid nodule    ultrasound 10/13  .  Vitamin D deficiency     Family History  Problem Relation Age of Onset  . Other Son        hx/o guillian barre  . Hearing loss Father   . Heart disease Maternal Grandmother   . Heart disease Maternal Grandfather   . Stroke Paternal Grandmother   . Stroke Paternal Grandfather   . Cancer Neg Hx   . Diabetes Neg Hx     Past Surgical History:  Procedure Laterality Date  . TUBAL LIGATION  ~2000  . WISDOM TOOTH EXTRACTION     Social History   Occupational History  . Occupation: Estate agent:     Comment: [employer]  Tobacco Use  . Smoking status: Never Smoker  . Smokeless tobacco: Never Used  Substance and Sexual Activity  . Alcohol  use: Yes    Alcohol/week: 1.8 oz    Types: 3 Glasses of wine per week    Comment: 3-4 times weekly  . Drug use: No  . Sexual activity: Yes    Birth control/protection: Surgical    Comment: tubal

## 2017-11-12 ENCOUNTER — Ambulatory Visit
Admission: RE | Admit: 2017-11-12 | Discharge: 2017-11-12 | Disposition: A | Discharge status: Home / Self Care | Payer: 59 | Source: Ambulatory Visit | Attending: Physician Assistant | Admitting: Physician Assistant

## 2017-11-12 DIAGNOSIS — M25562 Pain in left knee: Secondary | ICD-10-CM

## 2017-11-12 DIAGNOSIS — M179 Osteoarthritis of knee, unspecified: Secondary | ICD-10-CM | POA: Diagnosis not present

## 2017-11-15 ENCOUNTER — Encounter (INDEPENDENT_AMBULATORY_CARE_PROVIDER_SITE_OTHER): Payer: Self-pay | Admitting: Physician Assistant

## 2017-11-15 ENCOUNTER — Ambulatory Visit (INDEPENDENT_AMBULATORY_CARE_PROVIDER_SITE_OTHER): Payer: 59 | Admitting: Physician Assistant

## 2017-11-15 DIAGNOSIS — M1712 Unilateral primary osteoarthritis, left knee: Secondary | ICD-10-CM | POA: Insufficient documentation

## 2017-11-15 NOTE — Progress Notes (Signed)
[name] returns today follow-up of her left knee MRI.  She states naproxen takes the edge off of her knee pain but overall she feels her knee pain is getting worse.  She states that she can barely make it through a store walking due to the pain in the knee. MRI dated 11/12/2017 left knee shows advanced osteoarthritis for age with severe arthritis patellofemoral compartment and near full-thickness defect along the weightbearing portion of the lateral femoral condyle.  No frank tear of the medial or lateral meniscus which seen.  MRI images were reviewed with the patient and the knee model was used also to better visualize the findings for the patient.  Physical exam: General well-developed well-nourished female no acute distress. Left knee full extension and flexion to at least 90 degrees.  Impression: Left knee osteoarthritis  Plan: Discussion with the patient has had by Dr. Ninfa Linden and myself.  Due to the fact that she is failed cortisone injection anti-inflammatories recommend supplemental injection handouts given.  Other considerations include a total knee arthroplasty.  Total knee arthroplasty model was shown to the patient.  Postop protocol discussed with patient at length.  Risk benefits of surgery discussed at length.  She would like to think about what she would like to do in regards to her knee and get back to [place].  We will see her therefore on a as needed basis.

## 2017-11-26 ENCOUNTER — Ambulatory Visit: Payer: 59 | Admitting: Family Medicine

## 2017-11-30 ENCOUNTER — Ambulatory Visit: Payer: 59 | Admitting: Family Medicine

## 2017-12-05 ENCOUNTER — Other Ambulatory Visit (INDEPENDENT_AMBULATORY_CARE_PROVIDER_SITE_OTHER): Payer: Self-pay | Admitting: Physician Assistant

## 2017-12-10 ENCOUNTER — Telehealth (INDEPENDENT_AMBULATORY_CARE_PROVIDER_SITE_OTHER): Payer: Self-pay | Admitting: Orthopaedic Surgery

## 2017-12-10 NOTE — Telephone Encounter (Signed)
Patient called wanting to cancel her surgery with Dr. Ninfa Linden. If you have any questions you can give her a call back at [phone]

## 2017-12-15 ENCOUNTER — Ambulatory Visit: Payer: 59 | Admitting: Family Medicine

## 2017-12-16 NOTE — Telephone Encounter (Signed)
Canceled surgery.

## 2017-12-24 ENCOUNTER — Inpatient Hospital Stay: Admit: 2017-12-24 | Payer: 59 | Admitting: Orthopaedic Surgery

## 2017-12-24 SURGERY — ARTHROPLASTY, KNEE, TOTAL
Anesthesia: Choice | Laterality: Left

## 2017-12-31 MED FILL — LEVOTHYROXINE 175 MCG TABLE: 175 | 90 days supply | Qty: 90 | Fill #1

## 2018-01-06 ENCOUNTER — Inpatient Hospital Stay (INDEPENDENT_AMBULATORY_CARE_PROVIDER_SITE_OTHER): Payer: 59 | Admitting: Orthopaedic Surgery

## 2018-01-17 MED FILL — LISINOPRIL-HCTZ 10-12.5 MG: 10-12.5 | 90 days supply | Qty: 90 | Fill #1

## 2018-01-19 MED FILL — CYCLOBENZAPRINE 5 MG TABLET: 5 | 10 days supply | Qty: 30 | Fill #1

## 2018-03-28 MED FILL — buPROPion HCL ER (XL) 150 M: 150 | 14 days supply | Qty: 14 | Fill #0

## 2018-03-28 MED FILL — BUPROPION HCL XL 300 MG TAB: 300 | 90 days supply | Qty: 90 | Fill #0

## 2018-03-28 MED FILL — LEVOTHYROXINE 200 MCG TAB: 200 | 90 days supply | Qty: 90 | Fill #0

## 2018-05-20 ENCOUNTER — Encounter: Payer: Self-pay | Admitting: Family Medicine

## 2018-05-23 MED FILL — LISINOPRIL-HCTZ 10-12.5 MG: 10-12.5 | 90 days supply | Qty: 90 | Fill #2

## 2018-06-14 ENCOUNTER — Other Ambulatory Visit: Payer: Self-pay | Admitting: Internal Medicine

## 2018-06-14 DIAGNOSIS — Z1231 Encounter for screening mammogram for malignant neoplasm of breast: Secondary | ICD-10-CM

## 2018-06-22 MED FILL — CYCLOBENZAPRINE 5 MG TABLET: 5 | 10 days supply | Qty: 30 | Fill #0

## 2018-07-04 ENCOUNTER — Ambulatory Visit: Payer: Self-pay

## 2018-07-07 ENCOUNTER — Encounter (INDEPENDENT_AMBULATORY_CARE_PROVIDER_SITE_OTHER): Payer: Self-pay

## 2018-07-14 ENCOUNTER — Ambulatory Visit (INDEPENDENT_AMBULATORY_CARE_PROVIDER_SITE_OTHER): Payer: No Typology Code available for payment source | Admitting: Family Medicine

## 2018-07-14 ENCOUNTER — Encounter (INDEPENDENT_AMBULATORY_CARE_PROVIDER_SITE_OTHER): Payer: Self-pay | Admitting: Family Medicine

## 2018-07-14 VITALS — BP 156/94 | HR 82 | Temp 97.9°F | Ht 67.0 in | Wt 221.0 lb

## 2018-07-14 DIAGNOSIS — R0602 Shortness of breath: Secondary | ICD-10-CM

## 2018-07-14 DIAGNOSIS — Z0289 Encounter for other administrative examinations: Secondary | ICD-10-CM

## 2018-07-14 DIAGNOSIS — Z1331 Encounter for screening for depression: Secondary | ICD-10-CM

## 2018-07-14 DIAGNOSIS — Z6834 Body mass index (BMI) 34.0-34.9, adult: Secondary | ICD-10-CM

## 2018-07-14 DIAGNOSIS — E669 Obesity, unspecified: Secondary | ICD-10-CM | POA: Diagnosis not present

## 2018-07-14 DIAGNOSIS — I10 Essential (primary) hypertension: Secondary | ICD-10-CM

## 2018-07-14 DIAGNOSIS — R5383 Other fatigue: Secondary | ICD-10-CM | POA: Diagnosis not present

## 2018-07-14 DIAGNOSIS — Z9189 Other specified personal risk factors, not elsewhere classified: Secondary | ICD-10-CM

## 2018-07-15 LAB — CBC WITH DIFFERENTIAL
Basophils Absolute: 0.1 10*3/uL (ref 0.0–0.2)
Basos: 1 %
EOS (ABSOLUTE): 0.2 10*3/uL (ref 0.0–0.4)
EOS: 3 %
HEMATOCRIT: 34.4 % (ref 34.0–46.6)
HEMOGLOBIN: 10.4 g/dL — AB (ref 11.1–15.9)
Immature Grans (Abs): 0 10*3/uL (ref 0.0–0.1)
Immature Granulocytes: 0 %
LYMPHS ABS: 2.2 10*3/uL (ref 0.7–3.1)
Lymphs: 29 %
MCH: 23.6 pg — AB (ref 26.6–33.0)
MCHC: 30.2 g/dL — AB (ref 31.5–35.7)
MCV: 78 fL — AB (ref 79–97)
MONOS ABS: 0.4 10*3/uL (ref 0.1–0.9)
Monocytes: 6 %
NEUTROS ABS: 4.8 10*3/uL (ref 1.4–7.0)
Neutrophils: 61 %
RBC: 4.41 x10E6/uL (ref 3.77–5.28)
RDW: 19 % — ABNORMAL HIGH (ref 12.3–15.4)
WBC: 7.8 10*3/uL (ref 3.4–10.8)

## 2018-07-15 LAB — LIPID PANEL WITH LDL/HDL RATIO
CHOLESTEROL TOTAL: 230 mg/dL — AB (ref 100–199)
HDL: 40 mg/dL (ref >39)
LDL Calculated: 153 mg/dL — ABNORMAL HIGH (ref 0–99)
LDl/HDL Ratio: 3.8 ratio — ABNORMAL HIGH (ref 0.0–3.2)
Triglycerides: 184 mg/dL — ABNORMAL HIGH (ref 0–149)
VLDL CHOLESTEROL CAL: 37 mg/dL (ref 5–40)

## 2018-07-15 LAB — COMPREHENSIVE METABOLIC PANEL
A/G RATIO: 1.6 (ref 1.2–2.2)
ALBUMIN: 4.4 g/dL (ref 3.5–5.5)
ALT: 98 IU/L — ABNORMAL HIGH (ref 0–32)
AST: 89 IU/L — ABNORMAL HIGH (ref 0–40)
Alkaline Phosphatase: 153 IU/L — ABNORMAL HIGH (ref 39–117)
BUN / CREAT RATIO: 14 (ref 9–23)
BUN: 11 mg/dL (ref 6–24)
CO2: 22 mmol/L (ref 20–29)
CREATININE: 0.79 mg/dL (ref 0.57–1.00)
Calcium: 9.1 mg/dL (ref 8.7–10.2)
Chloride: 102 mmol/L (ref 96–106)
GFR calc Af Amer: 102 mL/min/{1.73_m2} (ref >59)
GFR calc non Af Amer: 89 mL/min/{1.73_m2} (ref >59)
GLOBULIN, TOTAL: 2.8 g/dL (ref 1.5–4.5)
Glucose: 81 mg/dL (ref 65–99)
POTASSIUM: 4.2 mmol/L (ref 3.5–5.2)
SODIUM: 141 mmol/L (ref 134–144)
Total Protein: 7.2 g/dL (ref 6.0–8.5)

## 2018-07-15 LAB — HEMOGLOBIN A1C
ESTIMATED AVERAGE GLUCOSE: 128 mg/dL
Hgb A1c MFr Bld: 6.1 % — ABNORMAL HIGH (ref 4.8–5.6)

## 2018-07-15 LAB — T3: T3 TOTAL: 127 ng/dL (ref 71–180)

## 2018-07-15 LAB — VITAMIN B12: Vitamin B-12: 751 pg/mL (ref 232–1245)

## 2018-07-15 LAB — INSULIN, RANDOM: INSULIN: 8.9 u[IU]/mL (ref 2.6–24.9)

## 2018-07-15 LAB — T4, FREE: Free T4: 1.43 ng/dL (ref 0.82–1.77)

## 2018-07-15 LAB — VITAMIN D 25 HYDROXY (VIT D DEFICIENCY, FRACTURES): VIT D 25 HYDROXY: 26.1 ng/mL — AB (ref 30.0–100.0)

## 2018-07-15 LAB — TSH: TSH: 0.645 u[IU]/mL (ref 0.450–4.500)

## 2018-07-15 LAB — FOLATE: Folate: 14.6 ng/mL (ref >3.0)

## 2018-07-18 NOTE — Progress Notes (Signed)
.  Office: [phone]  /  Fax: [phone]   HPI:   Chief Complaint: OBESITY  [name] (MR# [id number]) is a 47 y.o. female who presents on 07/14/2018 for obesity evaluation and treatment. Current BMI is Body mass index is 34.61 kg/m.[name] has struggled with obesity for years and has been unsuccessful in either losing weight or maintaining long term weight loss. [name] attended our information session and states she is currently in the action stage of change and ready to dedicate time achieving and maintaining a healthier weight.   [name] heard about our clinic from her hairdresser.  [name] states her family eats meals together she struggles with family and or coworkers weight loss sabotage her desired weight loss is 71 lbs she started gaining weight in her mid 20's her heaviest weight ever was 226 lbs she has significant food cravings issues  she wakes up frquently in the middle of the night to eat she skips meals frequently she is frequently drinking liquids with calories she frequently makes poor food choices she has problems with excessive hunger  she frequently eats larger portions than normal  she struggles with emotional eating    [name] feels her energy is lower than it should be. This has worsened with weight gain and has not worsened recently. [name] admits to daytime somnolence and  denies waking up still tired. Patient is at risk for obstructive sleep apnea. Patent has a history of symptoms of daytime fatigue. Patient generally gets 7 hours of sleep per night, and states they generally have generally restful sleep. Snoring is present. Apneic episodes are not present. Epworth Sleepiness Score is 12.  Dyspnea on exertion Anderson Malta notes increasing shortness of breath with exercising and seems to be worsening over time with weight gain. She notes getting out of breath sooner with activity than she used to. This has not gotten worse recently.  EKG-T wave abnormality, V4-V6 (similar to EKG from 2016). Tenishia denies orthopnea.  Hypertension [name] is a 47 y.o. female with hypertension. [name]'s blood pressure is elevated today, she didn't take her medications. She is on Wellbutrin XL 300 mg. She denies chest pain. She is working weight loss to help control her blood pressure with the goal of decreasing her risk of heart attack and stroke. [name]'s blood pressure is not currently controlled.  At risk for cardiovascular disease [name] is at a higher than average risk for cardiovascular disease due to obesity and hypertension. She currently denies any chest pain.  Depression Screen [name]'s Food and Mood (modified PHQ-9) score was  Depression screen PHQ 2/9 07/14/2018  Decreased Interest 3  Down, Depressed, Hopeless 3  PHQ - 2 Score 6  Altered sleeping 1  Tired, decreased energy 2  Change in appetite 2  Feeling bad or failure about yourself  3  Trouble concentrating 3  Moving slowly or fidgety/restless 0  Suicidal thoughts 0  PHQ-9 Score 17  Difficult doing work/chores Not difficult at all    ALLERGIES: No Known Allergies  MEDICATIONS: Current Outpatient Medications on File Prior to Visit  Medication Sig Dispense Refill  . buPROPion (WELLBUTRIN XL) 300 MG 24 hr tablet Take 300 mg by mouth daily.    . cyclobenzaprine (FLEXERIL) 5 MG tablet Take 1 tablet (5 mg total) by mouth 3 (three) times daily as needed for muscle spasms. 30 tablet 1  . Ferrous Sulfate Dried (FERROUS SULFATE CR PO) Take 65 mg by mouth.    . levothyroxine (SYNTHROID, LEVOTHROID) 175  MCG tablet Take 1 tablet (175 mcg total) by mouth daily before breakfast. (Patient taking differently: Take 200 mcg by mouth daily before breakfast. ) 90 tablet 3  . lisinopril-hydrochlorothiazide (PRINZIDE,ZESTORETIC) 10-12.5 MG tablet Take 1 tablet by mouth daily. 90 tablet 3   No current facility-administered medications on file prior to visit.     PAST  MEDICAL HISTORY: Past Medical History:  Diagnosis Date  . Abnormal uterine bleeding (AUB) 10/11/2015  . ADD (attention deficit disorder)   . Anxiety   . Arthritis   . Chronic headache   . Depression   . Depression with anxiety 12/30/2011  . Edema   . GERD (gastroesophageal reflux disease)   . Heart murmur   . Heavy periods   . History of pneumonia   . HLD (hyperlipidemia) 10/21/2016  . Hypertension   . Hypothyroidism   . Iron deficiency anemia   . Joint pain   . Thyroid nodule    ultrasound 10/13  . Vitamin D deficiency     PAST SURGICAL HISTORY: Past Surgical History:  Procedure Laterality Date  . TUBAL LIGATION  ~2000  . WISDOM TOOTH EXTRACTION      SOCIAL HISTORY: Social History   Tobacco Use  . Smoking status: Never Smoker  . Smokeless tobacco: Never Used  Substance Use Topics  . Alcohol use: Yes    Alcohol/week: 1.8 oz    Types: 3 Glasses of wine per week    Comment: 2 drinks a month  . Drug use: No    FAMILY HISTORY: Family History  Problem Relation Age of Onset  . Other Son        hx/o guillian barre  . Hearing loss Father   . Heart disease Maternal Grandmother   . Heart disease Maternal Grandfather   . Stroke Paternal Grandmother   . Stroke Paternal Grandfather   . Cancer Neg Hx   . Diabetes Neg Hx     ROS: Review of Systems  Constitutional: Positive for malaise/fatigue. Negative for weight loss.  Respiratory: Positive for shortness of breath (with exertion).   Cardiovascular: Negative for chest pain and orthopnea.  Gastrointestinal: Positive for heartburn.  Musculoskeletal: Positive for neck pain.       + Muscle or joint pain  Skin:       + Dryness    PHYSICAL EXAM: Blood pressure (!) 156/94, pulse 82, temperature 97.9 F (36.6 C), temperature source Oral, height 5\' 7"  (1.702 m), weight 221 lb (100.2 kg), last menstrual period 07/06/2018, SpO2 98 %. Body mass index is 34.61 kg/m. Physical Exam  Constitutional: She is oriented to  person, place, and time. She appears well-developed and well-nourished.  HENT:  Head: Normocephalic and atraumatic.  Nose: Nose normal.  Eyes: EOM are normal. No scleral icterus.  Neck: Normal range of motion. Neck supple. No thyromegaly present.  Cardiovascular: Normal rate and regular rhythm.  Pulmonary/Chest: Effort normal. No respiratory distress.  Abdominal: Soft. There is no tenderness.  + Obesity  Musculoskeletal:  Range of Motion normal in all 4 extremities Trace edema noted in bilateral lower extremities  Neurological: She is alert and oriented to person, place, and time. Coordination normal.  Skin: Skin is warm and dry.  Psychiatric: She has a normal mood and affect. Her behavior is normal.  Vitals reviewed.   RECENT LABS AND TESTS: BMET    Component Value Date/Time   NA 141 07/14/2018 1141   K 4.2 07/14/2018 1141   CL 102 07/14/2018 1141   CO2 22  07/14/2018 1141   GLUCOSE 81 07/14/2018 1141   GLUCOSE 109 (H) 05/27/2017 0913   BUN 11 07/14/2018 1141   CREATININE 0.79 07/14/2018 1141   CREATININE 0.86 05/27/2017 0913   CALCIUM 9.1 07/14/2018 1141   GFRNONAA 89 07/14/2018 1141   GFRAA 102 07/14/2018 1141   Lab Results  Component Value Date   HGBA1C 6.1 (H) 07/14/2018   Lab Results  Component Value Date   INSULIN 8.9 07/14/2018   CBC    Component Value Date/Time   WBC 7.8 07/14/2018 1141   WBC 5.0 05/27/2017 0913   RBC 4.41 07/14/2018 1141   RBC 4.53 05/27/2017 0913   HGB 10.4 (L) 07/14/2018 1141   HCT 34.4 07/14/2018 1141   PLT 378 05/27/2017 0913   PLT 335 10/11/2015 1323   MCV 78 (L) 07/14/2018 1141   MCH 23.6 (L) 07/14/2018 1141   MCH 23.2 (L) 05/27/2017 0913   MCHC 30.2 (L) 07/14/2018 1141   MCHC 31.1 (L) 05/27/2017 0913   RDW 19.0 (H) 07/14/2018 1141   LYMPHSABS 2.2 07/14/2018 1141   MONOABS 0.5 05/01/2014 1548   EOSABS 0.2 07/14/2018 1141   BASOSABS 0.1 07/14/2018 1141   Iron/TIBC/Ferritin/ %Sat No results found for: IRON, TIBC,  FERRITIN, IRONPCTSAT Lipid Panel     Component Value Date/Time   CHOL 230 (H) 07/14/2018 1141   TRIG 184 (H) 07/14/2018 1141   HDL 40 07/14/2018 1141   CHOLHDL 5.7 (H) 05/27/2017 0913   VLDL 33 (H) 05/27/2017 0913   LDLCALC 153 (H) 07/14/2018 1141   Hepatic Function Panel     Component Value Date/Time   PROT 7.2 07/14/2018 1141   ALBUMIN 4.4 07/14/2018 1141   AST 89 (H) 07/14/2018 1141   ALT 98 (H) 07/14/2018 1141   ALKPHOS 153 (H) 07/14/2018 1141   BILITOT <0.2 07/14/2018 1141      Component Value Date/Time   TSH 0.645 07/14/2018 1141   Vitamin D No recent labs  ECG  shows NSR with a rate of 82 INDIRECT CALORIMETER done today shows a VO2 of 293 and a REE of 2040. Her calculated basal metabolic rate is 3016 thus her basal metabolic rate is better than expected.    ASSESSMENT AND PLAN: Other fatigue - Plan: EKG 12-Lead, Vitamin B12, CBC With Differential, Comprehensive metabolic panel, Folate, Hemoglobin A1c, Insulin, random, Lipid Panel With LDL/HDL Ratio, T3, T4, free, TSH, VITAMIN D 25 Hydroxy (Vit-D Deficiency, Fractures)  Shortness of breath on exertion - Plan: CBC With Differential  Essential hypertension - Plan: Comprehensive metabolic panel  Depression screening  At risk for heart disease  Class 1 obesity with serious comorbidity and body mass index (BMI) of 34.0 to 34.9 in adult, unspecified obesity type  PLAN:  Fatigue Harmoney was informed that her fatigue may be related to obesity, depression or many other causes. Labs will be ordered, and in the meanwhile Fiora has agreed to work on diet, exercise and weight loss to help with fatigue. Proper sleep hygiene was discussed including the need for 7-8 hours of quality sleep each night. A sleep study was not ordered based on symptoms and Epworth score.  Dyspnea on exertion Saleemah's shortness of breath appears to be obesity related and exercise induced. She has agreed to work on weight loss and gradually  increase exercise to treat her exercise induced shortness of breath. If Lunette follows our instructions and loses weight without improvement of her shortness of breath, we will plan to refer to pulmonology. We will monitor  this condition regularly. Palma agrees to this plan.  Hypertension We discussed sodium restriction, working on healthy weight loss, and a regular exercise program as the means to achieve improved blood pressure control. Terese agreed with this plan and agreed to follow up as directed. We will continue to monitor her blood pressure as well as her progress with the above lifestyle modifications. She will continue her medications and will watch for signs of hypotension as she continues her lifestyle modifications. We will check labs today and Cierah agrees to follow up with our clinic in 2 weeks.  Cardiovascular risk counselling Shyasia was given extended (15 minutes) coronary artery disease prevention counseling today. She is 47 y.o. female and has risk factors for heart disease including obesity and hypertension. We discussed intensive lifestyle modifications today with an emphasis on specific weight loss instructions and strategies. Pt was also informed of the importance of increasing exercise and decreasing saturated fats to help prevent heart disease.  Depression Screen Ruhani had a strongly positive depression screening. Depression is commonly associated with obesity and often results in emotional eating behaviors. We will monitor this closely and work on CBT to help improve the non-hunger eating patterns. Referral to Psychology may be required if no improvement is seen as she continues in our clinic.  Obesity Evamae is currently in the action stage of change and her goal is to continue with weight loss efforts She has agreed to follow the Category 3 plan Ayauna has been instructed to work up to a goal of 150 minutes of combined cardio and strengthening exercise  per week for weight loss and overall health benefits. We discussed the following Behavioral Modification Strategies today: increasing lean protein intake, increasing vegetables, work on meal planning and easy cooking plans, dealing with family or coworker sabotage, better snacking choices, and planning for success  Avia has agreed to follow up with our clinic in 2 weeks. She was informed of the importance of frequent follow up visits to maximize her success with intensive lifestyle modifications for her multiple health conditions. She was informed we would discuss her lab results at her next visit unless there is a critical issue that needs to be addressed sooner. Oneisha agreed to keep her next visit at the agreed upon time to discuss these results.    OBESITY BEHAVIORAL INTERVENTION VISIT  Today's visit was # 1 out of 22.  Starting weight: 221 lbs Starting date: 07/14/18 Today's weight : 221 lbs  Today's date: 07/14/2018 Total lbs lost to date: 0    ASK: We discussed the diagnosis of obesity with Marchelle Gearing today and Anderson Malta agreed to give [place] permission to discuss obesity behavioral modification therapy today.  ASSESS: Navjot has the diagnosis of obesity and her BMI today is 34.61 Wilma is in the action stage of change   ADVISE: Norena was educated on the multiple health risks of obesity as well as the benefit of weight loss to improve her health. She was advised of the need for long term treatment and the importance of lifestyle modifications.  AGREE: Multiple dietary modification options and treatment options were discussed and  Crystina agreed to the above obesity treatment plan.   I, Trixie Dredge, am acting as transcriptionist for Ilene Qua, MD   I have reviewed the above documentation for accuracy and completeness, and I agree with the above. - Ilene Qua, MD

## 2018-08-01 ENCOUNTER — Ambulatory Visit (INDEPENDENT_AMBULATORY_CARE_PROVIDER_SITE_OTHER): Payer: No Typology Code available for payment source | Admitting: Family Medicine

## 2018-08-01 VITALS — BP 121/85 | HR 77 | Temp 98.0°F | Ht 67.0 in | Wt 216.0 lb

## 2018-08-01 DIAGNOSIS — Z6833 Body mass index (BMI) 33.0-33.9, adult: Secondary | ICD-10-CM | POA: Diagnosis not present

## 2018-08-01 DIAGNOSIS — Z9189 Other specified personal risk factors, not elsewhere classified: Secondary | ICD-10-CM | POA: Diagnosis not present

## 2018-08-01 DIAGNOSIS — E669 Obesity, unspecified: Secondary | ICD-10-CM

## 2018-08-01 DIAGNOSIS — R945 Abnormal results of liver function studies: Secondary | ICD-10-CM

## 2018-08-01 DIAGNOSIS — R7989 Other specified abnormal findings of blood chemistry: Secondary | ICD-10-CM

## 2018-08-01 DIAGNOSIS — R7303 Prediabetes: Secondary | ICD-10-CM

## 2018-08-01 DIAGNOSIS — E559 Vitamin D deficiency, unspecified: Secondary | ICD-10-CM | POA: Diagnosis not present

## 2018-08-02 MED ORDER — VITAMIN D (ERGOCALCIFEROL) 1.25 MG (50000 UNIT) PO CAPS: 50000.0000 [IU] | ORAL_CAPSULE | ORAL | 0 refills | Status: DC

## 2018-08-02 MED FILL — VIT D2 1.25 MG (50,000 UNIT: 1.25 MG | 28 days supply | Qty: 4 | Fill #0

## 2018-08-02 NOTE — Progress Notes (Signed)
Office: [phone]  /  Fax: [phone]   HPI:   Chief Complaint: OBESITY [name] is here to discuss her progress with her obesity treatment plan. She is on the Category 3 plan and is following her eating plan approximately 100 % of the time. She states she is exercising 0 minutes 0 times per week. [name] likes most of the food on the meal plan. [name] is missing potatoes and pasta. She reports that she is not hungry. Her weight is 216 lb (98 kg) today and has had a weight loss of 5 pounds over a period of 2 weeks since her last visit. She has lost 5 lbs since starting treatment with [place].  Elevated LFTs [name] has previously elevated LFTs. She denies abdominal pain or jaundice and has no history of hepatitis. She denies excessive alcohol intake.  Vitamin D deficiency [name] has a diagnosis of vitamin D deficiency. She is not currently taking vit D and denies nausea, vomiting or muscle weakness.  Pre-Diabetes [name] has a diagnosis of prediabetes. She has elevated Hgb A1c but low insulin level and was informed this puts her at greater risk of developing diabetes. She is not taking metformin currently and continues to work on diet and exercise to decrease risk of diabetes. She denies nausea or hypoglycemia.  At risk for diabetes [name] is at higher than average risk for developing diabetes due to her obesity and prediabetes. She currently denies polyuria or polydipsia.  ALLERGIES: No Known Allergies  MEDICATIONS: Current Outpatient Medications on File Prior to Visit  Medication Sig Dispense Refill  . buPROPion (WELLBUTRIN XL) 300 MG 24 hr tablet Take 300 mg by mouth daily.    . cyclobenzaprine (FLEXERIL) 5 MG tablet Take 1 tablet (5 mg total) by mouth 3 (three) times daily as needed for muscle spasms. 30 tablet 1  . Ferrous Sulfate Dried (FERROUS SULFATE CR PO) Take 65 mg by mouth.    . levothyroxine (SYNTHROID, LEVOTHROID) 175 MCG tablet Take 1 tablet (175 mcg total)  by mouth daily before breakfast. (Patient taking differently: Take 200 mcg by mouth daily before breakfast. ) 90 tablet 3  . lisinopril-hydrochlorothiazide (PRINZIDE,ZESTORETIC) 10-12.5 MG tablet Take 1 tablet by mouth daily. 90 tablet 3   No current facility-administered medications on file prior to visit.     PAST MEDICAL HISTORY: Past Medical History:  Diagnosis Date  . Abnormal uterine bleeding (AUB) 10/11/2015  . ADD (attention deficit disorder)   . Anxiety   . Arthritis   . Chronic headache   . Depression   . Depression with anxiety 12/30/2011  . Edema   . GERD (gastroesophageal reflux disease)   . Heart murmur   . Heavy periods   . History of pneumonia   . HLD (hyperlipidemia) 10/21/2016  . Hypertension   . Hypothyroidism   . Iron deficiency anemia   . Joint pain   . Thyroid nodule    ultrasound 10/13  . Vitamin D deficiency     PAST SURGICAL HISTORY: Past Surgical History:  Procedure Laterality Date  . TUBAL LIGATION  ~2000  . WISDOM TOOTH EXTRACTION      SOCIAL HISTORY: Social History   Tobacco Use  . Smoking status: Never Smoker  . Smokeless tobacco: Never Used  Substance Use Topics  . Alcohol use: Yes    Alcohol/week: 3.0 standard drinks    Types: 3 Glasses of wine per week    Comment: 2 drinks a month  . Drug use: No    FAMILY  HISTORY: Family History  Problem Relation Age of Onset  . Other Son        hx/o guillian barre  . Hearing loss Father   . Heart disease Maternal Grandmother   . Heart disease Maternal Grandfather   . Stroke Paternal Grandmother   . Stroke Paternal Grandfather   . Cancer Neg Hx   . Diabetes Neg Hx     ROS: Review of Systems  Constitutional: Positive for weight loss.  Gastrointestinal: Negative for abdominal pain, nausea and vomiting.  Genitourinary: Negative for frequency.  Musculoskeletal:       Negative for muscle weakness  Skin:       Negative for jaundice  Endo/Heme/Allergies: Negative for polydipsia.        Negative for hypoglycemia    PHYSICAL EXAM: Blood pressure 121/85, pulse 77, temperature 98 F (36.7 C), temperature source Oral, height 5\' 7"  (1.702 m), weight 216 lb (98 kg), last menstrual period 07/06/2018, SpO2 100 %. Body mass index is 33.83 kg/m. Physical Exam  Constitutional: She is oriented to person, place, and time. She appears well-developed and well-nourished.  Cardiovascular: Normal rate.  Pulmonary/Chest: Effort normal.  Musculoskeletal: Normal range of motion.  Neurological: She is oriented to person, place, and time.  Skin: Skin is warm and dry.  Psychiatric: She has a normal mood and affect. Her behavior is normal.  Vitals reviewed.   RECENT LABS AND TESTS: BMET    Component Value Date/Time   NA 141 07/14/2018 1141   K 4.2 07/14/2018 1141   CL 102 07/14/2018 1141   CO2 22 07/14/2018 1141   GLUCOSE 81 07/14/2018 1141   GLUCOSE 109 (H) 05/27/2017 0913   BUN 11 07/14/2018 1141   CREATININE 0.79 07/14/2018 1141   CREATININE 0.86 05/27/2017 0913   CALCIUM 9.1 07/14/2018 1141   GFRNONAA 89 07/14/2018 1141   GFRAA 102 07/14/2018 1141   Lab Results  Component Value Date   HGBA1C 6.1 (H) 07/14/2018   Lab Results  Component Value Date   INSULIN 8.9 07/14/2018   CBC    Component Value Date/Time   WBC 7.8 07/14/2018 1141   WBC 5.0 05/27/2017 0913   RBC 4.41 07/14/2018 1141   RBC 4.53 05/27/2017 0913   HGB 10.4 (L) 07/14/2018 1141   HCT 34.4 07/14/2018 1141   PLT 378 05/27/2017 0913   PLT 335 10/11/2015 1323   MCV 78 (L) 07/14/2018 1141   MCH 23.6 (L) 07/14/2018 1141   MCH 23.2 (L) 05/27/2017 0913   MCHC 30.2 (L) 07/14/2018 1141   MCHC 31.1 (L) 05/27/2017 0913   RDW 19.0 (H) 07/14/2018 1141   LYMPHSABS 2.2 07/14/2018 1141   MONOABS 0.5 05/01/2014 1548   EOSABS 0.2 07/14/2018 1141   BASOSABS 0.1 07/14/2018 1141   Iron/TIBC/Ferritin/ %Sat No results found for: IRON, TIBC, FERRITIN, IRONPCTSAT Lipid Panel     Component Value Date/Time   CHOL  230 (H) 07/14/2018 1141   TRIG 184 (H) 07/14/2018 1141   HDL 40 07/14/2018 1141   CHOLHDL 5.7 (H) 05/27/2017 0913   VLDL 33 (H) 05/27/2017 0913   LDLCALC 153 (H) 07/14/2018 1141   Hepatic Function Panel     Component Value Date/Time   PROT 7.2 07/14/2018 1141   ALBUMIN 4.4 07/14/2018 1141   AST 89 (H) 07/14/2018 1141   ALT 98 (H) 07/14/2018 1141   ALKPHOS 153 (H) 07/14/2018 1141   BILITOT <0.2 07/14/2018 1141      Component Value Date/Time   TSH 0.645  07/14/2018 1141   TSH 0.16 (L) 05/27/2017 0913   TSH 1.320 10/02/2015 1336   Results for Jensen, FLEURETTE WOOLBRIGHT (MRN [id number]) as of 08/02/2018 07:43  Ref. Range 07/14/2018 11:41  Vitamin D, 25-Hydroxy Latest Ref Range: 30.0 - 100.0 ng/mL 26.1 (L)   ASSESSMENT AND PLAN: Elevated LFTs - Plan: US Abdomen Limited RUQ  Vitamin D deficiency - Plan: Vitamin D, Ergocalciferol, (DRISDOL) 50000 units CAPS capsule  Prediabetes  At risk for diabetes mellitus  Class 1 obesity with serious comorbidity and body mass index (BMI) of 33.0 to 33.9 in adult, unspecified obesity type  PLAN:  Elevated LFTs We discussed the likely diagnosis of non alcoholic fatty liver disease today and how this condition is obesity related. [name] was educated on her risk of developing NASH or even liver failure and the only proven treatment for NAFLD was weight loss. [name] agreed to continue with her weight loss efforts with healthier diet and exercise as an essential part of her treatment plan. We will order ultrasound of right upper quadrant ([facility] 08/01/18 7:00 AM). [name] will follow up as directed.  Vitamin D Deficiency [name] was informed that low vitamin D levels contributes to fatigue and are associated with obesity, breast, and colon cancer. She agrees to start prescription Vit D @50 ,000 IU every week #4 with no refills and will follow up for routine testing of vitamin D, at least 2-3 times per year. She was informed of the risk of  over-replacement of vitamin D and agrees to not increase her dose unless she discusses this with [place] first. [name] agrees to follow up as directed.  Pre-Diabetes [name] will continue to work on weight loss, exercise, and decreasing simple carbohydrates in her diet to help decrease the risk of diabetes. She was informed that eating too many simple carbohydrates or too many calories at one sitting increases the likelihood of GI side effects. We will recheck labs in 3 months and [name] agreed to follow up with [place] as directed to monitor her progress.  Diabetes risk counseling [name] was given extended (30 minutes) diabetes prevention counseling today. She is 47 y.o. female and has risk factors for diabetes including obesity. We discussed intensive lifestyle modifications today with an emphasis on weight loss as well as increasing exercise and decreasing simple carbohydrates in her diet.  Obesity [name] is currently in the action stage of change. As such, her goal is to continue with weight loss efforts She has agreed to follow the Category 3 plan [name] has been instructed to work up to a goal of 150 minutes of combined cardio and strengthening exercise per week for weight loss and overall health benefits. We discussed the following Behavioral Modification Strategies today: planning for success, increasing lean protein intake, increasing vegetables and work on meal planning and easy cooking plans  [name] has agreed to follow up with our clinic in 2 weeks. She was informed of the importance of frequent follow up visits to maximize her success with intensive lifestyle modifications for her multiple health conditions.   OBESITY BEHAVIORAL INTERVENTION VISIT  Today's visit was # 2 out of 22.  Starting weight: 221 lbs Starting date: 07/14/18 Today's weight : 216 lbs Today's date: 08/01/2018 Total lbs lost to date: 5    ASK: We discussed the diagnosis of obesity with [name]  today and [name] agreed to give [place] permission to discuss obesity behavioral modification therapy today.  ASSESS: [name] has the diagnosis of obesity and her BMI today  is 33.82 [name] is in the action stage of change   ADVISE: [name] was educated on the multiple health risks of obesity as well as the benefit of weight loss to improve her health. She was advised of the need for long term treatment and the importance of lifestyle modifications.  AGREE: Multiple dietary modification options and treatment options were discussed and  [name] agreed to the above obesity treatment plan.  I, Doreene Nest, am acting as transcriptionist for Eber Jones, MD  I have reviewed the above documentation for accuracy and completeness, and I agree with the above. - Ilene Qua, MD

## 2018-08-04 ENCOUNTER — Ambulatory Visit (HOSPITAL_COMMUNITY)
Admission: RE | Admit: 2018-08-04 | Discharge: 2018-08-04 | Disposition: A | Discharge status: Home / Self Care | Payer: No Typology Code available for payment source | Source: Ambulatory Visit | Attending: Family Medicine | Admitting: Family Medicine

## 2018-08-04 DIAGNOSIS — K760 Fatty (change of) liver, not elsewhere classified: Secondary | ICD-10-CM | POA: Diagnosis not present

## 2018-08-04 DIAGNOSIS — R7989 Other specified abnormal findings of blood chemistry: Secondary | ICD-10-CM | POA: Diagnosis present

## 2018-08-04 DIAGNOSIS — R945 Abnormal results of liver function studies: Secondary | ICD-10-CM

## 2018-08-05 ENCOUNTER — Ambulatory Visit: Payer: Self-pay

## 2018-08-16 ENCOUNTER — Ambulatory Visit (INDEPENDENT_AMBULATORY_CARE_PROVIDER_SITE_OTHER): Payer: No Typology Code available for payment source | Admitting: Family Medicine

## 2018-08-16 VITALS — BP 113/76 | HR 70 | Temp 98.0°F | Ht 67.0 in | Wt 212.0 lb

## 2018-08-16 DIAGNOSIS — E669 Obesity, unspecified: Secondary | ICD-10-CM

## 2018-08-16 DIAGNOSIS — E559 Vitamin D deficiency, unspecified: Secondary | ICD-10-CM

## 2018-08-16 DIAGNOSIS — I10 Essential (primary) hypertension: Secondary | ICD-10-CM | POA: Diagnosis not present

## 2018-08-16 DIAGNOSIS — Z6833 Body mass index (BMI) 33.0-33.9, adult: Secondary | ICD-10-CM | POA: Diagnosis not present

## 2018-08-16 NOTE — Progress Notes (Signed)
Office: [phone]  /  Fax: [phone]   HPI:   Chief Complaint: OBESITY [name] is here to discuss her progress with her obesity treatment plan. She is on the Category 3 plan and is following her eating plan approximately 98 % of the time. She states she is walking 25 to 30 minutes 3 to 4 times per week. [name] has enjoyed 2 indulgences: a hot dog and pasta salad. She doesn't think she is always getting in [place] of meat at dinner. Her weight is 212 lb (96.2 kg) today and has had a weight loss of 4 pounds over a period of 2 weeks since her last visit. She has lost 9 lbs since starting treatment with [name].  Hypertension [name] is a 47 y.o. female with hypertension.  [name] denies chest pain, chest pressure, and headaches. She is working on weight loss to help control her blood pressure with the goal of decreasing her risk of heart attack and stroke. [name] blood pressure is currently controlled today.  Vitamin D deficiency [name] has a diagnosis of vitamin D deficiency. She is currently taking vit D. She admits fatigue and denies nausea, vomiting or muscle weakness.  [name]: No Known [name]  MEDICATIONS: Current Outpatient Medications on File Prior to Visit  Medication Sig Dispense Refill  . cyclobenzaprine (FLEXERIL) 5 MG tablet Take 1 tablet (5 mg total) by mouth 3 (three) times daily as needed for muscle spasms. 30 tablet 1  . Ferrous Sulfate Dried (FERROUS SULFATE CR PO) Take 65 mg by mouth.    . levothyroxine (SYNTHROID, LEVOTHROID) 175 MCG tablet Take 1 tablet (175 mcg total) by mouth daily before breakfast. (Patient taking differently: Take 200 mcg by mouth daily before breakfast. ) 90 tablet 3  . lisinopril-hydrochlorothiazide (PRINZIDE,ZESTORETIC) 10-12.5 MG tablet Take 1 tablet by mouth daily. 90 tablet 3  . Vitamin D, Ergocalciferol, (DRISDOL) 50000 units CAPS capsule Take 1 capsule (50,000 Units total) by mouth every 7 (seven) days. 4 capsule  0   No current facility-administered medications on file prior to visit.     PAST MEDICAL HISTORY: Past Medical History:  Diagnosis Date  . Abnormal uterine bleeding (AUB) 10/11/2015  . ADD (attention deficit disorder)   . Anxiety   . Arthritis   . Chronic headache   . Depression   . Depression with anxiety 12/30/2011  . Edema   . GERD (gastroesophageal reflux disease)   . Heart murmur   . Heavy periods   . History of pneumonia   . HLD (hyperlipidemia) 10/21/2016  . Hypertension   . Hypothyroidism   . Iron deficiency anemia   . Joint pain   . Thyroid nodule    ultrasound 10/13  . Vitamin D deficiency     PAST SURGICAL HISTORY: Past Surgical History:  Procedure Laterality Date  . TUBAL LIGATION  ~2000  . WISDOM TOOTH EXTRACTION      SOCIAL HISTORY: Social History   Tobacco Use  . Smoking status: Never Smoker  . Smokeless tobacco: Never Used  Substance Use Topics  . Alcohol use: Yes    Alcohol/week: 3.0 standard drinks    Types: 3 Glasses of wine per week    Comment: 2 drinks a month  . Drug use: No    FAMILY HISTORY: Family History  Problem Relation Age of Onset  . Other Son        hx/o guillian barre  . Hearing loss Father   . Heart disease Maternal Grandmother   .  Heart disease Maternal Grandfather   . Stroke Paternal Grandmother   . Stroke Paternal Grandfather   . Cancer Neg Hx   . Diabetes Neg Hx     ROS: Review of Systems  Constitutional: Positive for malaise/fatigue and weight loss.  Cardiovascular: Negative for chest pain.       Negative for chest pressure.  Gastrointestinal: Negative for nausea and vomiting.  Musculoskeletal:       Negative for muscle weakness.  Neurological: Negative for headaches.    PHYSICAL EXAM: Blood pressure 113/76, pulse 70, temperature 98 F (36.7 C), temperature source Oral, height 5\' 7"  (1.702 m), weight 212 lb (96.2 kg), SpO2 99 %. Body mass index is 33.2 kg/m. Physical Exam  Constitutional: She is  oriented to person, place, and time. She appears well-developed and well-nourished.  Cardiovascular: Normal rate.  Pulmonary/Chest: Effort normal.  Musculoskeletal: Normal range of motion.  Neurological: She is oriented to person, place, and time.  Skin: Skin is warm and dry.  Psychiatric: She has a normal mood and affect. Her behavior is normal.  Vitals reviewed.   RECENT LABS AND TESTS: BMET    Component Value Date/Time   NA 141 07/14/2018 1141   K 4.2 07/14/2018 1141   CL 102 07/14/2018 1141   CO2 22 07/14/2018 1141   GLUCOSE 81 07/14/2018 1141   GLUCOSE 109 (H) 05/27/2017 0913   BUN 11 07/14/2018 1141   CREATININE 0.79 07/14/2018 1141   CREATININE 0.86 05/27/2017 0913   CALCIUM 9.1 07/14/2018 1141   GFRNONAA 89 07/14/2018 1141   GFRAA 102 07/14/2018 1141   Lab Results  Component Value Date   HGBA1C 6.1 (H) 07/14/2018   Lab Results  Component Value Date   INSULIN 8.9 07/14/2018   CBC    Component Value Date/Time   WBC 7.8 07/14/2018 1141   WBC 5.0 05/27/2017 0913   RBC 4.41 07/14/2018 1141   RBC 4.53 05/27/2017 0913   HGB 10.4 (L) 07/14/2018 1141   HCT 34.4 07/14/2018 1141   PLT 378 05/27/2017 0913   PLT 335 10/11/2015 1323   MCV 78 (L) 07/14/2018 1141   MCH 23.6 (L) 07/14/2018 1141   MCH 23.2 (L) 05/27/2017 0913   MCHC 30.2 (L) 07/14/2018 1141   MCHC 31.1 (L) 05/27/2017 0913   RDW 19.0 (H) 07/14/2018 1141   LYMPHSABS 2.2 07/14/2018 1141   MONOABS 0.5 05/01/2014 1548   EOSABS 0.2 07/14/2018 1141   BASOSABS 0.1 07/14/2018 1141   Iron/TIBC/Ferritin/ %Sat No results found for: IRON, TIBC, FERRITIN, IRONPCTSAT Lipid Panel     Component Value Date/Time   CHOL 230 (H) 07/14/2018 1141   TRIG 184 (H) 07/14/2018 1141   HDL 40 07/14/2018 1141   CHOLHDL 5.7 (H) 05/27/2017 0913   VLDL 33 (H) 05/27/2017 0913   LDLCALC 153 (H) 07/14/2018 1141   Hepatic Function Panel     Component Value Date/Time   PROT 7.2 07/14/2018 1141   ALBUMIN 4.4 07/14/2018 1141    AST 89 (H) 07/14/2018 1141   ALT 98 (H) 07/14/2018 1141   ALKPHOS 153 (H) 07/14/2018 1141   BILITOT <0.2 07/14/2018 1141      Component Value Date/Time   TSH 0.645 07/14/2018 1141   TSH 0.16 (L) 05/27/2017 0913   TSH 1.320 10/02/2015 1336   Results for [name], [name] (MRN [id number]) as of 08/16/2018 11:15  Ref. Range 07/14/2018 11:41  Vitamin D, 25-Hydroxy Latest Ref Range: 30.0 - 100.0 ng/mL 26.1 (L)   ASSESSMENT AND PLAN:  Essential hypertension  Vitamin D deficiency  Class 1 obesity with serious comorbidity and body mass index (BMI) of 33.0 to 33.9 in adult, unspecified obesity type  PLAN:  Hypertension We discussed sodium restriction, working on healthy weight loss, and a regular exercise program as the means to achieve improved blood pressure control. [name] agreed with this plan and agreed to follow up as directed. We will continue to monitor her blood pressure as well as her progress with the above lifestyle modifications. She will continue her Prinzide as prescribed, no refill needed and will watch for signs of hypotension as she continues her lifestyle modifications.  Vitamin D Deficiency [name] was informed that low vitamin D levels contributes to fatigue and are associated with obesity, breast, and colon cancer. She agrees to continue to take prescription Vit D @50 ,000 IU every week, no refills needed and will follow up for routine testing of vitamin D, at least 2-3 times per year. She was informed of the risk of over-replacement of vitamin D and agrees to not increase her dose unless she discusses this with [name] first.  I spent > than 50% of the 15 minute visit on counseling as documented in the note.  Obesity [name] is currently in the action stage of change. As such, her goal is to continue with weight loss efforts.  She has agreed to follow the Category 3 plan. Analy has been instructed to work up to a goal of 150 minutes of combined cardio and strengthening  exercise per week for weight loss and overall health benefits. We discussed the following Behavioral Modification Strategies today: increasing lean protein intake, increasing vegetables, work on meal planning and easy cooking plans, better snacking choices, and planning for success.  [name] has agreed to follow up with our clinic in 3 to 4 weeks. She was informed of the importance of frequent follow up visits to maximize her success with intensive lifestyle modifications for her multiple health conditions.   OBESITY BEHAVIORAL INTERVENTION VISIT  Today's visit was # 3   Starting weight: 221 lbs Starting date: 07/14/18 Today's weight : Weight: 212 lb (96.2 kg)  Today's date: 08/16/2018 Total lbs lost to date: 9 At least 15 minutes were spent on discussing the following behavioral intervention visit.   ASK: We discussed the diagnosis of obesity with [name] today and [name] agreed to give [name] permission to discuss obesity behavioral modification therapy today.  ASSESS: [name] has the diagnosis of obesity and her BMI today is 33.2.  [name] is in the action stage of change.   ADVISE: Avalina was educated on the multiple health risks of obesity as well as the benefit of weight loss to improve her health. She was advised of the need for long term treatment and the importance of lifestyle modifications to improve her current health and to decrease her risk of future health problems.  AGREE: Multiple dietary modification options and treatment options were discussed and  Shauniece agreed to follow the recommendations documented in the above note.  ARRANGE: Dama was educated on the importance of frequent visits to treat obesity as outlined per CMS and USPSTF guidelines and agreed to schedule her next follow up appointment today.  I, Marcille Blanco, am acting as Location manager for Eber Jones, MD  I have reviewed the above documentation for accuracy and completeness,  and I agree with the above. - Ilene Qua, MD

## 2018-08-24 ENCOUNTER — Other Ambulatory Visit: Payer: Self-pay | Admitting: Family Medicine

## 2018-08-24 MED FILL — LEVOTHYROXINE 200 MCG TAB: 200 | 90 days supply | Qty: 90 | Fill #1

## 2018-08-25 MED FILL — LISINOPRIL-HCTZ 10-12.5 MG: 10-12.5 | 90 days supply | Qty: 90 | Fill #0

## 2018-09-06 ENCOUNTER — Ambulatory Visit (INDEPENDENT_AMBULATORY_CARE_PROVIDER_SITE_OTHER): Payer: Self-pay | Admitting: Family Medicine

## 2018-09-16 ENCOUNTER — Ambulatory Visit (INDEPENDENT_AMBULATORY_CARE_PROVIDER_SITE_OTHER): Payer: Self-pay | Admitting: Obstetrics and Gynecology

## 2018-09-16 DIAGNOSIS — Z23 Encounter for immunization: Secondary | ICD-10-CM

## 2018-09-16 NOTE — Progress Notes (Signed)
Pt presents here today for visit to receive INFLUENZA  vaccine. Allergies reviewed, vaccine given, vaccine information statement provided, tolerated well.

## 2018-09-19 ENCOUNTER — Encounter (INDEPENDENT_AMBULATORY_CARE_PROVIDER_SITE_OTHER): Payer: Self-pay

## 2018-09-19 ENCOUNTER — Ambulatory Visit (INDEPENDENT_AMBULATORY_CARE_PROVIDER_SITE_OTHER): Payer: Self-pay | Admitting: Family Medicine

## 2018-10-20 ENCOUNTER — Ambulatory Visit (INDEPENDENT_AMBULATORY_CARE_PROVIDER_SITE_OTHER): Payer: No Typology Code available for payment source | Admitting: Family Medicine

## 2018-10-20 VITALS — BP 141/93 | HR 76 | Temp 98.1°F | Ht 67.0 in | Wt 217.0 lb

## 2018-10-20 DIAGNOSIS — I10 Essential (primary) hypertension: Secondary | ICD-10-CM | POA: Diagnosis not present

## 2018-10-20 DIAGNOSIS — F3289 Other specified depressive episodes: Secondary | ICD-10-CM | POA: Diagnosis not present

## 2018-10-20 DIAGNOSIS — Z9189 Other specified personal risk factors, not elsewhere classified: Secondary | ICD-10-CM | POA: Diagnosis not present

## 2018-10-20 DIAGNOSIS — E669 Obesity, unspecified: Secondary | ICD-10-CM

## 2018-10-20 DIAGNOSIS — E559 Vitamin D deficiency, unspecified: Secondary | ICD-10-CM

## 2018-10-20 DIAGNOSIS — Z6834 Body mass index (BMI) 34.0-34.9, adult: Secondary | ICD-10-CM

## 2018-10-20 MED ORDER — VITAMIN D (ERGOCALCIFEROL) 1.25 MG (50000 UNIT) PO CAPS: 50000.0000 [IU] | ORAL_CAPSULE | ORAL | 0 refills | Status: DC

## 2018-10-20 MED FILL — VIT D2 1.25 MG (50,000 UNIT: 1.25 MG | 28 days supply | Qty: 4 | Fill #0

## 2018-10-24 NOTE — Progress Notes (Signed)
Office: [phone]  /  Fax: [phone]   HPI:   Chief Complaint: OBESITY [name] is here to discuss her progress with her obesity treatment plan. She is on the Category 3 plan and is following her eating plan approximately 0 % of the time. She states she is exercising 0 minutes 0 times per week. [name] has struggled to get on track after her vacation in September as she has cancelled a few appointments. She has knee pain and needs a knee replacement for which she is seeing Dr. Ninfa Linden.  Her weight is 217 lb (98.4 kg) today and has had a weight loss of 5 pounds over a period of 9 weeks since her last visit. She has lost 4 lbs since starting treatment with [place].  Hypertension [name] is a 47 y.o. female with hypertension. She is working on weight loss to help control her blood pressure with the goal of decreasing her risk of heart attack and stroke. [name]'s blood pressure is slightly elevated at 141/93 and she is on blood pressure medications.  Vitamin D deficiency [name] has a diagnosis of vitamin D deficiency. She is currently taking vit D. She admits fatigue and denies nausea, vomiting, or muscle weakness.  At risk for osteopenia and osteoporosis [name] is at higher risk of osteopenia and osteoporosis due to vitamin D deficiency.   Depression with emotional eating behaviors [name] is struggling with emotional eating and using food for comfort to the extent that it is negatively impacting her health. She says there is family stress with her mother that is causing her to eat emotionally. She often snacks when she is not hungry. [name] sometimes feels she is out of control and then feels guilty that she made poor food choices. She shows no sign of suicidal or homicidal ideations.  ALLERGIES: No Known Allergies  MEDICATIONS: Current Outpatient Medications on File Prior to Visit  Medication Sig Dispense Refill  . cyclobenzaprine (FLEXERIL) 5 MG tablet Take 1  tablet (5 mg total) by mouth 3 (three) times daily as needed for muscle spasms. 30 tablet 1  . Ferrous Sulfate Dried (FERROUS SULFATE CR PO) Take 65 mg by mouth.    . levothyroxine (SYNTHROID, LEVOTHROID) 175 MCG tablet Take 1 tablet (175 mcg total) by mouth daily before breakfast. (Patient taking differently: Take 200 mcg by mouth daily before breakfast. ) 90 tablet 3  . lisinopril-hydrochlorothiazide (PRINZIDE,ZESTORETIC) 10-12.5 MG tablet Take 1 tablet by mouth daily. 90 tablet 3   No current facility-administered medications on file prior to visit.     PAST MEDICAL HISTORY: Past Medical History:  Diagnosis Date  . Abnormal uterine bleeding (AUB) 10/11/2015  . ADD (attention deficit disorder)   . Anxiety   . Arthritis   . Chronic headache   . Depression   . Depression with anxiety 12/30/2011  . Edema   . GERD (gastroesophageal reflux disease)   . Heart murmur   . Heavy periods   . History of pneumonia   . HLD (hyperlipidemia) 10/21/2016  . Hypertension   . Hypothyroidism   . Iron deficiency anemia   . Joint pain   . Thyroid nodule    ultrasound 10/13  . Vitamin D deficiency     PAST SURGICAL HISTORY: Past Surgical History:  Procedure Laterality Date  . TUBAL LIGATION  ~2000  . WISDOM TOOTH EXTRACTION      SOCIAL HISTORY: Social History   Tobacco Use  . Smoking status: Never Smoker  . Smokeless tobacco: Never Used  Substance Use Topics  . Alcohol use: Yes    Alcohol/week: 3.0 standard drinks    Types: 3 Glasses of wine per week    Comment: 2 drinks a month  . Drug use: No    FAMILY HISTORY: Family History  Problem Relation Age of Onset  . Other Son        hx/o guillian barre  . Hearing loss Father   . Heart disease Maternal Grandmother   . Heart disease Maternal Grandfather   . Stroke Paternal Grandmother   . Stroke Paternal Grandfather   . Cancer Neg Hx   . Diabetes Neg Hx     ROS: Review of Systems  Constitutional: Positive for malaise/fatigue  and weight loss.  Gastrointestinal: Negative for nausea and vomiting.  Musculoskeletal:       Negative for muscle weakness.  Psychiatric/Behavioral: Positive for depression. Negative for suicidal ideas.       Negative for homicidal ideations.    PHYSICAL EXAM: Blood pressure (!) 141/93, pulse 76, temperature 98.1 F (36.7 C), temperature source Oral, height 5\' 7"  (1.702 m), weight 217 lb (98.4 kg), SpO2 99 %. Body mass index is 33.99 kg/m. Physical Exam  Constitutional: She is oriented to person, place, and time. She appears well-developed and well-nourished.  Cardiovascular: Normal rate.  Pulmonary/Chest: Effort normal.  Musculoskeletal: Normal range of motion.  Neurological: She is oriented to person, place, and time.  Skin: Skin is warm and dry.  Psychiatric: She has a normal mood and affect. Her behavior is normal.  Vitals reviewed.   RECENT LABS AND TESTS: BMET    Component Value Date/Time   NA 141 07/14/2018 1141   K 4.2 07/14/2018 1141   CL 102 07/14/2018 1141   CO2 22 07/14/2018 1141   GLUCOSE 81 07/14/2018 1141   GLUCOSE 109 (H) 05/27/2017 0913   BUN 11 07/14/2018 1141   CREATININE 0.79 07/14/2018 1141   CREATININE 0.86 05/27/2017 0913   CALCIUM 9.1 07/14/2018 1141   GFRNONAA 89 07/14/2018 1141   GFRAA 102 07/14/2018 1141   Lab Results  Component Value Date   HGBA1C 6.1 (H) 07/14/2018   Lab Results  Component Value Date   INSULIN 8.9 07/14/2018   CBC    Component Value Date/Time   WBC 7.8 07/14/2018 1141   WBC 5.0 05/27/2017 0913   RBC 4.41 07/14/2018 1141   RBC 4.53 05/27/2017 0913   HGB 10.4 (L) 07/14/2018 1141   HCT 34.4 07/14/2018 1141   PLT 378 05/27/2017 0913   PLT 335 10/11/2015 1323   MCV 78 (L) 07/14/2018 1141   MCH 23.6 (L) 07/14/2018 1141   MCH 23.2 (L) 05/27/2017 0913   MCHC 30.2 (L) 07/14/2018 1141   MCHC 31.1 (L) 05/27/2017 0913   RDW 19.0 (H) 07/14/2018 1141   LYMPHSABS 2.2 07/14/2018 1141   MONOABS 0.5 05/01/2014 1548    EOSABS 0.2 07/14/2018 1141   BASOSABS 0.1 07/14/2018 1141   Iron/TIBC/Ferritin/ %Sat No results found for: IRON, TIBC, FERRITIN, IRONPCTSAT Lipid Panel     Component Value Date/Time   CHOL 230 (H) 07/14/2018 1141   TRIG 184 (H) 07/14/2018 1141   HDL 40 07/14/2018 1141   CHOLHDL 5.7 (H) 05/27/2017 0913   VLDL 33 (H) 05/27/2017 0913   LDLCALC 153 (H) 07/14/2018 1141   Hepatic Function Panel     Component Value Date/Time   PROT 7.2 07/14/2018 1141   ALBUMIN 4.4 07/14/2018 1141   AST 89 (H) 07/14/2018 1141   ALT 98 (  H) 07/14/2018 1141   ALKPHOS 153 (H) 07/14/2018 1141   BILITOT <0.2 07/14/2018 1141      Component Value Date/Time   TSH 0.645 07/14/2018 1141   TSH 0.16 (L) 05/27/2017 0913   TSH 1.320 10/02/2015 1336   Results for Bok, JAIMEY FRANCHINI (MRN [id number]) as of 10/24/2018 15:01  Ref. Range 07/14/2018 11:41  Vitamin D, 25-Hydroxy Latest Ref Range: 30.0 - 100.0 ng/mL 26.1 (L)   ASSESSMENT AND PLAN: Vitamin D deficiency - Plan: Vitamin D, Ergocalciferol, (DRISDOL) 1.25 MG (50000 UT) CAPS capsule  Essential hypertension  Other depression - with emotional eating  At risk for osteoporosis  Class 1 obesity with serious comorbidity and body mass index (BMI) of 34.0 to 34.9 in adult, unspecified obesity type  PLAN:  Hypertension We discussed sodium restriction, working on healthy weight loss, and a regular exercise program as the means to achieve improved blood pressure control. We will continue to monitor her blood pressure as well as her progress with the above lifestyle modifications. She will continue her current medications as prescribed and will watch for signs of hypotension as she continues her lifestyle modifications. [name] agreed with this plan and agreed to follow up as directed in 2 weeks  Vitamin D Deficiency [name] was informed that low vitamin D levels contributes to fatigue and are associated with obesity, breast, and colon cancer. She agrees to  continue to take prescription Vit D @50 ,000 IU every week #4 with no refills and will follow up for routine testing of vitamin D, at least 2-3 times per year. She was informed of the risk of over-replacement of vitamin D and agrees to not increase her dose unless she discusses this with [place] first. Mahati agrees to follow up as directed.  At risk for osteopenia and osteoporosis [name] was given extended (15 minutes) osteoporosis prevention counseling today.[name] is at risk for osteopenia and osteoporosis due to her vitamin D deficiency. She was encouraged to take her vitamin D and follow her higher calcium diet and increase strengthening exercise to help strengthen her bones and decrease her risk of osteopenia and osteoporosis.  Depression with Emotional Eating Behaviors We discussed behavior modification techniques today to help [name] deal with her emotional eating and depression. Patient was referred to Dr. Mallie Mussel, our bariatric psychologist for evaluation due to significant struggles with emotional eating.  Obesity [name] is currently in the action stage of change. As such, her goal is to continue with weight loss efforts. She has agreed to follow the Category 3 plan. [name] has been instructed to work up to a goal of 150 minutes of combined cardio and strengthening exercise per week for weight loss and overall health benefits. We discussed the following Behavioral Modification Strategies today: increasing lean protein intake, increasing vegetables, work on meal planning and easy cooking plans, and planning for success.  [name] has agreed to follow up with our clinic in 2 weeks. She was informed of the importance of frequent follow up visits to maximize her success with intensive lifestyle modifications for her multiple health conditions.   OBESITY BEHAVIORAL INTERVENTION VISIT  Today's visit was # 4   Starting weight: 221 lbs Starting date: 07/14/18 Today's weight : Weight:  217 lb (98.4 kg)  Today's date: 10/20/2018 Total lbs lost to date: 4   ASK: We discussed the diagnosis of obesity with [name] today and [name] agreed to give [place] permission to discuss obesity behavioral modification therapy today.  ASSESS: [name] has the diagnosis of obesity  and her BMI today is 33.98. [name] is in the action stage of change.   ADVISE: [name] was educated on the multiple health risks of obesity as well as the benefit of weight loss to improve her health. She was advised of the need for long term treatment and the importance of lifestyle modifications to improve her current health and to decrease her risk of future health problems.  AGREE: Multiple dietary modification options and treatment options were discussed and Para agreed to follow the recommendations documented in the above note.  ARRANGE: [name] was educated on the importance of frequent visits to treat obesity as outlined per CMS and USPSTF guidelines and agreed to schedule her next follow up appointment today.  I, [name], am acting as Location manager for Eber Jones, MD  I have reviewed the above documentation for accuracy and completeness, and I agree with the above. - Ilene Qua, MD

## 2018-11-01 ENCOUNTER — Ambulatory Visit (INDEPENDENT_AMBULATORY_CARE_PROVIDER_SITE_OTHER): Payer: No Typology Code available for payment source | Admitting: Orthopaedic Surgery

## 2018-11-01 ENCOUNTER — Encounter (INDEPENDENT_AMBULATORY_CARE_PROVIDER_SITE_OTHER): Payer: Self-pay | Admitting: Orthopaedic Surgery

## 2018-11-01 ENCOUNTER — Ambulatory Visit (INDEPENDENT_AMBULATORY_CARE_PROVIDER_SITE_OTHER): Payer: No Typology Code available for payment source

## 2018-11-01 DIAGNOSIS — G8929 Other chronic pain: Secondary | ICD-10-CM | POA: Diagnosis not present

## 2018-11-01 DIAGNOSIS — M1712 Unilateral primary osteoarthritis, left knee: Secondary | ICD-10-CM | POA: Diagnosis not present

## 2018-11-01 DIAGNOSIS — M25562 Pain in left knee: Secondary | ICD-10-CM

## 2018-11-01 NOTE — Progress Notes (Signed)
++++++++++++++++++++++++++++++++++++++++++++++++++++++++++++++                                                                                                                              ++  ++++++++++++++++++++                                                                                                                                                                     Office Visit Note   Patient: [name]           Date of Birth: 01-05-1971           MRN: [id number] Visit Date: 11/01/2018              Requested by: Asencion Noble, MD [address] [place], [place] [address] PCP: Asencion Noble, MD   Assessment & Plan: Visit Diagnoses:  1. Chronic pain of left knee   2. Unilateral primary osteoarthritis, left knee     Plan: At this point she wants to consider knee replacement surgery at the end of January of next year.  This is just over 2 months away.  She is going to work on quad strengthening exercises until then.  We had a long and thorough discussion about knee replacement surgery.  We went over her plain films and her MRI.  I showed her knee model explained in detail with surgery involved including a long thorough discussion about interoperative and postoperative course as well as the risk and benefits of surgery.  We will work on getting this scheduled and will see her back at 2 weeks postoperative.  All question concerns were answered and addressed.  Follow-Up Instructions: Return for 2 weeks post-op.   Orders:  Orders Placed This Encounter  Procedures  . XR Knee 1-2 Views Left   No orders of the defined types were placed in this encounter.     Procedures: No procedures  performed   Clinical Data: No additional findings.   Subjective: Chief Complaint  Patient presents with  . Left Knee - Pain  The patient is well-known to [place].  She is a very active 47 year old female with debilitating arthritis involving her left knee.  On plain films the patellofemoral  joint is significant arthritic.  We obtained an MRI showing that shows she has significant cartilage loss in all 3 compartments of her knee in spite of plain films showing well-maintained medial lateral compartments.  We did provide steroid injection in her left knee which only helped for a week or so.  She is very active individual and the pain is been slowly getting worse.  She has worked on [employer].  At this point in spite of conservative treatment her pain has become daily.  She has significant problems going up and down stairs.  She does get intermittent left knee swelling.  At this point her knee pain has definitely affect her actives daily living, her quality life and her mobility.  At this point she does wish to consider knee replacement surgery given the failure of conservative treatment measures and her continued pain.  She is not a diabetic.  HPI  Review of Systems She currently denies any headache, chest pain, shortness of breath, fever, chills, nausea, vomiting.  Objective: Vital Signs: There were no vitals taken for this visit.  Physical Exam She is alert or x3 and in no acute distress Ortho Exam Examination of her left knee shows a slight effusion comparing left and right knees.  There is significant patellofemoral crepitation and medial joint tenderness.  There is no instability ligamentously. Specialty Comments:  No specialty comments available.  Imaging: Xr Knee 1-2 Views Left  Result Date: 11/01/2018 2 views of the left knee show severe patellofemoral arthritic changes.  The medial lateral compartments on plain film appear well-maintained.  The MRI was  independently reviewed again from last year of her left knee showing significant arthritic changes throughout the knee.  PMFS History: Patient Active Problem List   Diagnosis Date Noted  . Unilateral primary osteoarthritis, left knee 11/01/2018  . Primary osteoarthritis of left knee 11/15/2017  . Dense breast tissue on mammogram 10/21/2016  . HLD (hyperlipidemia) 10/21/2016  . LVH (left ventricular hypertrophy) 10/21/2016  . Mitral regurgitation 10/21/2016  . Abnormal uterine bleeding (AUB) 10/11/2015  . Hypertension 10/01/2015  . Menorrhagia with regular cycle 06/26/2014  . ADD (attention deficit disorder) 02/18/2012  . Hypothyroidism 12/30/2011   Past Medical History:  Diagnosis Date  . Abnormal uterine bleeding (AUB) 10/11/2015  . ADD (attention deficit disorder)   . Anxiety   . Arthritis   . Chronic headache   . Depression   . Depression with anxiety 12/30/2011  . Edema   . GERD (gastroesophageal reflux disease)   . Heart murmur   . Heavy periods   . History of pneumonia   . HLD (hyperlipidemia) 10/21/2016  . Hypertension   . Hypothyroidism   . Iron deficiency anemia   . Joint pain   . Thyroid nodule    ultrasound 10/13  . Vitamin D deficiency     Family History  Problem Relation Age of Onset  . Other Son        hx/o guillian barre  . Hearing loss Father   . Heart disease Maternal Grandmother   . Heart disease Maternal Grandfather   . Stroke Paternal Grandmother   . Stroke Paternal Grandfather   . Cancer Neg Hx   . Diabetes Neg Hx     Past Surgical History:  Procedure Laterality Date  . TUBAL LIGATION  ~2000  . WISDOM TOOTH EXTRACTION     Social History   Occupational History  . Occupation: Care Guide    Employer: [employer]  Comment: [employer]  Tobacco Use  . Smoking status: Never Smoker  . Smokeless tobacco: Never Used  Substance and Sexual Activity  . Alcohol use: Yes    Alcohol/week: 3.0 standard drinks    Types: 3 Glasses  of wine per week    Comment: 2 drinks a month  . Drug use: No  . Sexual activity: Yes    Birth control/protection: Surgical    Comment: tubal

## 2018-11-08 ENCOUNTER — Ambulatory Visit (INDEPENDENT_AMBULATORY_CARE_PROVIDER_SITE_OTHER): Payer: No Typology Code available for payment source | Admitting: Family Medicine

## 2018-11-08 ENCOUNTER — Encounter (INDEPENDENT_AMBULATORY_CARE_PROVIDER_SITE_OTHER): Payer: Self-pay

## 2018-11-08 ENCOUNTER — Ambulatory Visit (INDEPENDENT_AMBULATORY_CARE_PROVIDER_SITE_OTHER): Payer: No Typology Code available for payment source | Admitting: Psychology

## 2018-12-13 NOTE — Pre-Procedure Instructions (Signed)
   [name]  12/13/2018     [place], [place] - [address] [facility]. [address] [place] [address] Phone: [phone] Fax: [place], [place] - [place] [address] Phone: [phone] Fax: [phone]  [id number] - [place], [place] [address] Phone: [phone] Fax: [phone]   Your procedure is scheduled on Tuesday, December 27, 2018  Report to [facility] at 10:00 A.M.  Call this number if you have problems the morning of surgery:  [phone]   Remember:  Do not eat or drink after midnight Monday, December 26, 2018  Take these medicines the morning of surgery with A SIP OF WATER : levothyroxine (SYNTHROID, LEVOTHROID) Stop taking Aspirin (unless advised otherwise by your surgeon), vitamins, fish oil and herbal medications. Do not take any NSAIDs ie: Ibuprofen, Advil, Naproxen (Aleve), Motrin, BC and Goody Powder; stop 7 days prior to surgery.   Do not wear jewelry, make-up or nail polish.  Do not wear lotions, powders, or perfumes, or deodorant.  Do not shave 48 hours prior to surgery.    Do not bring valuables to the hospital.  [facility] is not responsible for any belongings or valuables.  Brush your teeth the morning of surgery with your regular tooth paste. Contacts, dentures or bridgework may not be worn into surgery.  Leave your suitcase in the car.  After surgery it may be brought to your room. Special instructions: See " [facility]-Preparing For Surgery " sheet. Please read over the following fact sheets that you were given. Pain Booklet, Coughing and Deep Breathing, Total Joint Packet, MRSA Information and Surgical Site Infection Prevention

## 2018-12-15 ENCOUNTER — Encounter (HOSPITAL_COMMUNITY): Payer: Self-pay

## 2018-12-15 ENCOUNTER — Encounter (HOSPITAL_COMMUNITY)
Admission: RE | Admit: 2018-12-15 | Discharge: 2018-12-15 | Disposition: A | Discharge status: Home / Self Care | Payer: No Typology Code available for payment source | Source: Ambulatory Visit | Attending: Orthopaedic Surgery | Admitting: Orthopaedic Surgery

## 2018-12-15 ENCOUNTER — Other Ambulatory Visit: Payer: Self-pay

## 2018-12-15 ENCOUNTER — Ambulatory Visit (INDEPENDENT_AMBULATORY_CARE_PROVIDER_SITE_OTHER): Payer: Self-pay | Admitting: Nurse Practitioner

## 2018-12-15 VITALS — BP 135/90 | HR 74 | Temp 98.2°F | Resp 16 | Wt 229.2 lb

## 2018-12-15 DIAGNOSIS — Z79890 Hormone replacement therapy (postmenopausal): Secondary | ICD-10-CM | POA: Insufficient documentation

## 2018-12-15 DIAGNOSIS — Z01818 Encounter for other preprocedural examination: Secondary | ICD-10-CM | POA: Diagnosis present

## 2018-12-15 DIAGNOSIS — E039 Hypothyroidism, unspecified: Secondary | ICD-10-CM | POA: Insufficient documentation

## 2018-12-15 DIAGNOSIS — I340 Nonrheumatic mitral (valve) insufficiency: Secondary | ICD-10-CM | POA: Insufficient documentation

## 2018-12-15 DIAGNOSIS — K219 Gastro-esophageal reflux disease without esophagitis: Secondary | ICD-10-CM | POA: Insufficient documentation

## 2018-12-15 DIAGNOSIS — Z79899 Other long term (current) drug therapy: Secondary | ICD-10-CM | POA: Insufficient documentation

## 2018-12-15 DIAGNOSIS — L259 Unspecified contact dermatitis, unspecified cause: Secondary | ICD-10-CM

## 2018-12-15 DIAGNOSIS — I10 Essential (primary) hypertension: Secondary | ICD-10-CM | POA: Diagnosis not present

## 2018-12-15 DIAGNOSIS — K760 Fatty (change of) liver, not elsewhere classified: Secondary | ICD-10-CM | POA: Insufficient documentation

## 2018-12-15 DIAGNOSIS — D509 Iron deficiency anemia, unspecified: Secondary | ICD-10-CM | POA: Diagnosis not present

## 2018-12-15 DIAGNOSIS — E785 Hyperlipidemia, unspecified: Secondary | ICD-10-CM | POA: Insufficient documentation

## 2018-12-15 DIAGNOSIS — M1712 Unilateral primary osteoarthritis, left knee: Secondary | ICD-10-CM | POA: Diagnosis not present

## 2018-12-15 DIAGNOSIS — E559 Vitamin D deficiency, unspecified: Secondary | ICD-10-CM | POA: Diagnosis not present

## 2018-12-15 LAB — SURGICAL PCR SCREEN
MRSA, PCR: NEGATIVE
Staphylococcus aureus: POSITIVE — AB

## 2018-12-15 LAB — BASIC METABOLIC PANEL
Anion gap: 11 (ref 5–15)
BUN: 15 mg/dL (ref 6–20)
CO2: 22 mmol/L (ref 22–32)
CREATININE: 0.9 mg/dL (ref 0.44–1.00)
Calcium: 8.9 mg/dL (ref 8.9–10.3)
Chloride: 105 mmol/L (ref 98–111)
GFR calc Af Amer: >60 mL/min (ref >60)
GFR calc non Af Amer: >60 mL/min (ref >60)
Glucose, Bld: 101 mg/dL — ABNORMAL HIGH (ref 70–99)
Potassium: 3.7 mmol/L (ref 3.5–5.1)
Sodium: 138 mmol/L (ref 135–145)

## 2018-12-15 LAB — CBC
HEMATOCRIT: 38.2 % (ref 36.0–46.0)
Hemoglobin: 11.4 g/dL — ABNORMAL LOW (ref 12.0–15.0)
MCH: 24.2 pg — ABNORMAL LOW (ref 26.0–34.0)
MCHC: 29.8 g/dL — ABNORMAL LOW (ref 30.0–36.0)
MCV: 81.1 fL (ref 80.0–100.0)
Platelets: 283 10*3/uL (ref 150–400)
RBC: 4.71 MIL/uL (ref 3.87–5.11)
RDW: 21.6 % — AB (ref 11.5–15.5)
WBC: 8.3 10*3/uL (ref 4.0–10.5)
nRBC: 0 % (ref 0.0–0.2)

## 2018-12-15 MED ORDER — PREDNISONE 10 MG (21) PO TBPK: ORAL_TABLET | ORAL | 0 refills | Status: AC

## 2018-12-15 MED FILL — predniSONE 10 MG TABS: 10 | 10 days supply | Qty: 30 | Fill #0

## 2018-12-15 MED FILL — LISINOPRIL-HCTZ 10-12.5 MG: 10-12.5 | 90 days supply | Qty: 90 | Fill #1

## 2018-12-15 NOTE — Pre-Procedure Instructions (Signed)
[name]  12/15/2018     [place], [place] - [address] [facility]. [address] [place] [address] Phone: [phone] Fax: [phone]  [id number] - [place], [place] - [place] [address] Phone: [phone] Fax: [phone]  [id number] - [place], [place] [address] Phone: [phone] Fax: [phone]   Your procedure is scheduled on Tuesday, December 27, 2018  Report to [facility] at 10:00 A.M.  Call this number if you have problems the morning of surgery:  [phone]   Remember:  Do not eat or drink after midnight Monday, December 26, 2018    Take these medicines the morning of surgery with A SIP OF WATER :  levothyroxine (SYNTHROID, LEVOTHROID)   Stop taking Aspirin (unless advised otherwise by your surgeon), vitamins, fish oil and herbal medications. Do not take any NSAIDs ie: Ibuprofen, Advil, Naproxen (Aleve), Motrin, BC and Goody Powder; stop 7 days prior to surgery.     Do not wear jewelry, make-up or nail polish.  Do not wear lotions, powders, or perfumes, or deodorant.  Do not shave 48 hours prior to surgery.    Do not bring valuables to the hospital.  [facility] is not responsible for any belongings or valuables.   Brush your teeth the morning of surgery with your regular tooth paste.  Contacts, dentures or bridgework may not be worn into surgery.  Leave your suitcase in the car.  After surgery it may be brought to your room. Special instructions:  [facility]- Preparing For Surgery  Before surgery, you can play an important role. Because skin is not sterile, your skin needs to be as free of germs as possible. You can reduce the number of germs on your skin by washing with CHG (chlorahexidine gluconate) Soap before surgery.  CHG is an antiseptic cleaner which kills germs and bonds with  the skin to continue killing germs even after washing.    Oral Hygiene is also important to reduce your risk of infection.  Remember - BRUSH YOUR TEETH THE MORNING OF SURGERY WITH YOUR REGULAR TOOTHPASTE  Please do not use if you have an allergy to CHG or antibacterial soaps. If your skin becomes reddened/irritated stop using the CHG.  Do not shave (including legs and underarms) for at least 48 hours prior to first CHG shower. It is OK to shave your face.  Please follow these instructions carefully.   1. Shower the NIGHT BEFORE SURGERY and the MORNING OF SURGERY with CHG.   2. If you chose to wash your hair, wash your hair first as usual with your normal shampoo.  3. After you shampoo, rinse your hair and body thoroughly to remove the shampoo.  4. Use CHG as you would any other liquid soap. You can apply CHG directly to the skin and wash gently with a scrungie or a clean washcloth.   5. Apply the CHG Soap to your body ONLY FROM THE NECK DOWN.  Do not use on open wounds or open sores. Avoid contact with your eyes, ears, mouth and genitals (private parts). Wash Face and genitals (private parts)  with your normal soap.  6. Wash thoroughly, paying special attention to the area where your surgery will be performed.  7. Thoroughly rinse your body with warm water from the neck down.  8. DO NOT shower/wash with your normal  soap after using and rinsing off the CHG Soap.  9. Pat yourself dry with a CLEAN TOWEL.  10. Wear CLEAN PAJAMAS to bed the night before surgery, wear comfortable clothes the morning of surgery  11. Place CLEAN SHEETS on your bed the night of your first shower and DO NOT SLEEP WITH PETS.    Day of Surgery:  Do not apply any deodorants/lotions.  Please wear clean clothes to the hospital/surgery center.   Remember to brush your teeth WITH YOUR REGULAR TOOTHPASTE.   Please read over the following fact sheets that you were given.

## 2018-12-15 NOTE — Progress Notes (Signed)
Mupirocin Ointment Rx called into [facility] for positive PCR of Staph. Pt notified of results and instructed to pick up Rx.

## 2018-12-15 NOTE — Progress Notes (Signed)
Subjective:     [name] is a 48 y.o. female who presents for evaluation of a rash involving the chest, bilateral upper extremities, back and bilateral lower extremities. Rash started 1 week ago. Lesions are red, and raised and flat in texture. Rash has changed over time. Rash causes no discomfort. Associated symptoms: none. Patient denies: abdominal pain, decrease in energy level, fever, headache, nausea, sore throat and vomiting. Patient has not had contacts with similar rash.  Patient states that the rash started on her back and lower legs, then a few days later moved to her chest.  Patient states today she noticed it was on her arms.  Patient has not had new exposures (soaps, lotions, laundry detergents, foods, medications, plants, insects or animals).  The following portions of the patient's history were reviewed and updated as appropriate: allergies, current medications and past medical history.  Review of Systems Constitutional: negative Eyes: negative Ears, nose, mouth, throat, and face: negative Respiratory: negative Cardiovascular: negative Gastrointestinal: negative Integument/breast: positive for rash, skin color change and skin lesion(s), negative for pruritus and discharge Neurological: negative    Objective:    BP 135/90 (BP Location: Right Arm, Patient Position: Sitting, Cuff Size: Normal)   Pulse 74   Temp 98.2 F (36.8 C) (Oral)   Resp 16   Wt 229 lb 3.2 oz (104 kg)   SpO2 98%   BMI 35.90 kg/m  General:  alert, cooperative and no distress  Skin:  maculopapular rash to back, chest, bilateral upper extremities and bilateral lower extremities, rash is erythematous, + wheals on lower extremities, covers greater than 50% of body surface area     Assessment:    contact dermatitis: Unknown Trigger    Plan:   Exam findings, diagnosis etiology and medication use and indications reviewed with patient. Follow- Up and discharge instructions provided. No  emergent/urgent issues found on exam.  Patient symptoms are consistent with contact dermatitis.  Due to the large surface area of the rash, will cover the patient with prednisone for 10 days.  Patient also instructed to begin Pepcid AC and to use Zyrtec or Benadryl.  This rash is not contagious, however etiology is unknown.  Patient education was provided. Patient verbalized understanding of information provided and agrees with plan of care (POC), all questions answered. The patient is advised to call or return to clinic if condition does not see an improvement in symptoms, or to seek the care of the closest emergency department if condition worsens with the above plan.   1. Contact dermatitis, unspecified contact dermatitis type, unspecified trigger  - predniSONE (STERAPRED UNI-PAK 21 TAB) 10 MG (21) TBPK tablet; 50mg  x 2 days, 40mg  x 2 days,30mg  x 2 days, 20mg  x 2 days, 10mg  x 2 days  Dispense: 30 tablet; Refill: 0 -Take medication as prescribed. -Take Pepcid AC 20mg  in the morning with prednisone.  Take Zyrtec 10mg  or Benadryl 25mg  at bedtime until symptoms improve. -Use Aveeno Colloidal Oatmeal bath.  Bathe twice daily in this solution until symptoms improve. -Do not scratch the affected areas. -Use lukewarm water for bathing. -Follow up with PCP if symptoms do not improve.

## 2018-12-15 NOTE — Patient Instructions (Signed)
Contact Dermatitis  -Take medication as prescribed. -Take Pepcid AC 20mg  in the morning with prednisone.  Take Zyrtec 10mg  or Benadryl 25mg  at bedtime until symptoms improve. -Use Aveeno Colloidal Oatmeal bath.  Bathe twice daily in this solution until symptoms improve. -Do not scratch the affected areas. -Use lukewarm water for bathing. -Follow up with PCP if symptoms do not improve.   ermatitis is redness, soreness, and swelling (inflammation) of the skin. Contact dermatitis is a reaction to certain substances that touch the skin. Many different substances can cause contact dermatitis. There are two types of contact dermatitis:  Irritant contact dermatitis. This type is caused by something that irritates your skin, such as having dry hands from washing them too often with soap. This type does not require previous exposure to the substance for a reaction to occur. This is the most common type.  Allergic contact dermatitis. This type is caused by a substance that you are allergic to, such as poison ivy. This type occurs when you have been exposed to the substance (allergen) and develop a sensitivity to it. Dermatitis may develop soon after your first exposure to the allergen, or it may not develop until the next time you are exposed and every time thereafter. What are the causes? Irritant contact dermatitis is most commonly caused by exposure to:  Makeup.  Soaps.  Detergents.  Bleaches.  Acids.  Metal salts, such as nickel. Allergic contact dermatitis is most commonly caused by exposure to:  Poisonous plants.  Chemicals.  Jewelry.  Latex.  Medicines.  Preservatives in products, such as clothing. What increases the risk? You are more likely to develop this condition if you have:  A job that exposes you to irritants or allergens.  Certain medical conditions, such as asthma or eczema. What are the signs or symptoms? Symptoms of this condition may occur on your body  anywhere the irritant has touched you or is touched by you.  Symptoms include: ? Dryness or flaking. ? Redness. ? Cracks. ? Itching. ? Pain or a burning feeling. ? Blisters. ? Drainage of small amounts of blood or clear fluid from skin cracks. With allergic contact dermatitis, there may also be swelling in areas such as the eyelids, mouth, or genitals. How is this diagnosed? This condition is diagnosed with a medical history and physical exam.  A patch skin test may be performed to help determine the cause.  If the condition is related to your job, you may need to see an occupational medicine specialist. How is this treated? This condition is treated by checking for the cause of the reaction and protecting your skin from further contact. Treatment may also include:  Steroid creams or ointments. Oral steroid medicines may be needed in more severe cases.  Antibiotic medicines or antibacterial ointments, if a skin infection is present.  Antihistamine lotion or an antihistamine taken by mouth to ease itching.  A bandage (dressing). Follow these instructions at home: Skin care  Moisturize your skin as needed.  Apply cool compresses to the affected areas.  Try applying baking soda paste to your skin. Stir water into baking soda until it reaches a paste-like consistency.  Do not scratch your skin, and avoid friction to the affected area.  Avoid the use of soaps, perfumes, and dyes. Medicines  Take or apply over-the-counter and prescription medicines only as told by your health care provider.  If you were prescribed an antibiotic medicine, take or apply the antibiotic as told by your health care provider.  Do not stop using the antibiotic even if your condition improves. Bathing  Try taking a bath with: ? Epsom salts. Follow the instructions on the packaging. You can get these at your local pharmacy or grocery store. ? Baking soda. Pour a small amount into the bath as directed  by your health care provider. ? Colloidal oatmeal. Follow the instructions on the packaging. You can get this at your local pharmacy or grocery store.  Bathe less frequently, such as every other day.  Bathe in lukewarm water. Avoid using hot water. Bandage care  If you were given a bandage (dressing), change it as told by your health care provider.  Wash your hands with soap and water before and after you change your dressing. If soap and water are not available, use hand sanitizer. General instructions  Avoid the substance that caused your reaction. If you do not know what caused it, keep a journal to try to track what caused it. Write down: ? What you eat. ? What cosmetic products you use. ? What you drink. ? What you wear in the affected area. This includes jewelry.  Check the affected areas every day for signs of infection. Check for: ? More redness, swelling, or pain. ? More fluid or blood. ? Warmth. ? Pus or a bad smell.  Keep all follow-up visits as told by your health care provider. This is important. Contact a health care provider if:  Your condition does not improve with treatment.  Your condition gets worse.  You have signs of infection such as swelling, tenderness, redness, soreness, or warmth in the affected area.  You have a fever.  You have new symptoms. Get help right away if:  You have a severe headache, neck pain, or neck stiffness.  You vomit.  You feel very sleepy.  You notice red streaks coming from the affected area.  Your bone or joint underneath the affected area becomes painful after the skin has healed.  The affected area turns darker.  You have difficulty breathing. Summary  Dermatitis is redness, soreness, and swelling (inflammation) of the skin. Contact dermatitis is a reaction to certain substances that touch the skin.  Symptoms of this condition may occur on your body anywhere the irritant has touched you or is touched by  you.  This condition is treated by figuring out what caused the reaction and protecting your skin from further contact. Treatment may also include medicines and skin care.  Avoid the substance that caused your reaction. If you do not know what caused it, keep a journal to try to track what caused it.  Contact a health care provider if your condition gets worse or you have signs of infection such as swelling, tenderness, redness, soreness, or warmth in the affected area. This information is not intended to replace advice given to you by your health care provider. Make sure you discuss any questions you have with your health care provider. Document Released: 11/27/2000 Document Revised: 06/15/2018 Document Reviewed: 06/15/2018 Elsevier Interactive Patient Education  2019 [employer].

## 2018-12-15 NOTE — Progress Notes (Addendum)
PCP - Asencion Noble Cardiologist - Saw Dr. Harl Bowie in 2016 but no longer sees cardiology  Chest x-ray - not needed EKG - 07/14/18 Stress Test -denies  ECHO - 2016 Cardiac Cath -denies   Anesthesia review: yes, echo in 2016 no cardiology follow up since then  Was treated for high blood pressure  Patient currently has contact dermatitis (chest, back, legs, arms)  was seen at [facility] 12/15/17 received a prescription for prednisone.  Patient understands that she will need to keep an eye on it and if it does not get any better then she will go back to the doctor   Patient denies shortness of breath, fever, cough and chest pain at PAT appointment   Patient verbalized understanding of instructions that were given to them at the PAT appointment. Patient was also instructed that they will need to review over the PAT instructions again at home before surgery.

## 2018-12-15 NOTE — Progress Notes (Signed)
PCP - Asencion Noble Cardiologist - Saw Dr. Harl Bowie in 2016 but no longer sees cardiology  Chest x-ray - not needed EKG - 07/14/18 Stress Test -denies  ECHO - 2016 Cardiac Cath -denies   Anesthesia review: yes, echo in 2016 no cardiology follow up since then  Was treated for high blood pressure  Patient currently has contact dermatitis (chest, back, legs, arms)  was seen at [facility] 12/15/17 received a prescription for prednisone.  Patient understands that she will need to keep an eye on it and if it does not get any better then she will go back to the doctor.  Chauncey Mann made aware, Almedia Balls was called and message left for her about this.  Patient denies shortness of breath, fever, cough and chest pain at PAT appointment   Patient verbalized understanding of instructions that were given to them at the PAT appointment. Patient was also instructed that they will need to review over the PAT instructions again at home before surgery.

## 2018-12-16 LAB — HEPATIC FUNCTION PANEL
ALT: 50 U/L — ABNORMAL HIGH (ref 0–44)
AST: 42 U/L — ABNORMAL HIGH (ref 15–41)
Albumin: 3.9 g/dL (ref 3.5–5.0)
Alkaline Phosphatase: 90 U/L (ref 38–126)
BILIRUBIN TOTAL: 0.3 mg/dL (ref 0.3–1.2)
Bilirubin, Direct: <0.1 mg/dL (ref 0.0–0.2)
Total Protein: 6.9 g/dL (ref 6.5–8.1)

## 2018-12-16 MED FILL — MUPIROCIN 2% OINTMENT: 2 | 5 days supply | Qty: 22 | Fill #0

## 2018-12-16 NOTE — Progress Notes (Signed)
Anesthesia Chart Review:  Case:  [id number] Date/Time:  12/27/18 1145   Procedure:  LEFT TOTAL KNEE ARTHROPLASTY (Left Knee)   Anesthesia type:  Spinal   Pre-op diagnosis:  osteoarthritis left knee   Location:  [facility] OR ROOM 17 / [place] [facility]   Surgeon:  Mcarthur Rossetti, MD      DISCUSSION: Patient is a 48 year old female scheduled for the above procedure.  History includes never smoker, hypothyroidism, HTN, HLD, murmur (mild MR 11/2015 echo), anemia, ADD, GERD, tooth implant. BMI is consistent with obesity.   She was noted to have elevated LFTS at the [facility] clinic in August. Abdominal ultrasound showed hepatic steatosis. She did not report any significant ETOH use (3 glasses wine/week). HFP added to PAT labs and showed only mild elevation in AST/ALT.    AST  Date Value  12/15/2018 42 U/L (H)  07/14/2018 89 IU/L (H)  05/27/2017 79 U/L (H)  08/22/2013 15 U/L   ALT  Date Value  12/15/2018 50 U/L (H)  07/14/2018 98 IU/L (H)  05/27/2017 137 U/L (H)  08/22/2013 15 U/L   EKG from 07/14/18 showed rather diffuse T wave abnormality (except in septal leads) that was more pronounced in anterolateral leads. Changes are more pronounced when compared to 06-30-17 EKG but similar to 10/02/15 tracing. She was seen by cardiology following her 09/2015 EKG. Echo ordered that showed normal LVEF, mild LVH, mild MR.  - At PAT she denied SOB, chest pain, cough, fever.   She is currently undergoing treatment for contact dermatitis--seen at [facility] and started on prednisone (due to large surface area involved) 12/15/18 along with recommendations for Pepcid, Zyrtec, Benadryl and Avenno Colloidal Oatmeal bath until symptoms improve. Message left for Sherrie at Dr. Trevor Mace office regarding this.   Reviewed with anesthesiologist Lyn Hollingshead, MD including LFTs, EKGs, cardiology tests and notes. EKG overall stable since 2016. She denied CV symptoms. LFTs have overall improved with ultrasound showing  hepatic steatosis. If no acute changes then it is anticipated that she can proceed as planned.   VS: BP (!) 149/87   Pulse 84   Temp 36.5 C   Ht 5\' 6"  (1.676 m)   Wt 102.4 kg   LMP 12/14/2018   SpO2 96%   BMI 36.43 kg/m     PROVIDERS: Asencion Noble, MD is listed as PCP. - She is also followed by Lucendia Herrlich, MD at the [facility].   Carlyle Dolly, MD is cardiologist. She was seen in 11/2015 for evaluation of chest pain thought to be likely musculoskeletal.  She was noted to have a 2/6 systolic murmur and LV strain pattern on EKG so echo ordered showing mild MR, trace TR. She did not go back for follow-up. Her prior PCP Blanchie Serve, MD provided new referral in 05/2017 since patient was having palpitations but no sustained arrhythmias on 24 hour Holter monitor (see below).   LABS: Preoperative labs noted. A1c 07/14/18 was 6.1. HFP added due to history of elevated LFTs earlier this year which overall improvement (see Discussion).  (all labs ordered are listed, but only abnormal results are displayed)  Labs Reviewed  SURGICAL PCR SCREEN - Abnormal; Notable for the following components:      Result Value   Staphylococcus aureus POSITIVE (*)    All other components within normal limits  BASIC METABOLIC PANEL - Abnormal; Notable for the following components:   Glucose, Bld 101 (*)    All other components within  normal limits  CBC - Abnormal; Notable for the following components:   Hemoglobin 11.4 (*)    MCH 24.2 (*)    MCHC 29.8 (*)    RDW 21.6 (*)    All other components within normal limits     IMAGES: U/S ABD 08/04/18: IMPRESSION: Diffuse increase in liver echogenicity, a finding indicative of hepatic steatosis. While no focal liver lesions are evident on this study, it must be cautioned that the sensitivity of ultrasound for detection of focal liver lesions is diminished in this circumstance. Study otherwise unremarkable.   EKG:  07/14/18: SR, T wave abnormalitly, possible anterolateral ischemia. Mild T wave inversion/falttening in inferior leads as well which are more non-specific. T wave changes are more pronounced when compared to 2017-06-29 EKG but similar to 10/02/15 tracing.    CV: 24 Hour holter monitor 06/10/17:  Telemetry tracings show SR with PVCs  Min HR 48, max HR 129, Avg HR 80  Rare supraventricular ectopy  Moderate ventricular ectopy (14%). Primarily as isolated PVCs, bigeiminy, and trigeminy. No NSVT or VT  Reported symptoms of palpitations and SOB correlate with SR and PVCs  Echo 11/20/15: Study Conclusions - Left ventricle: The cavity size was normal. Wall thickness was   increased in a pattern of mild LVH. Systolic function was normal.   The estimated ejection fraction was in the range of 60% to 65%.   Wall motion was normal; there were no regional wall motion   abnormalities. Left ventricular diastolic function parameters   were normal. - Mitral valve: No echocardiographic evidence for prolapse. There   was mild regurgitation. - Tricuspid valve: There was trivial regurgitation.   Past Medical History:  Diagnosis Date  . Abnormal uterine bleeding (AUB) 10/11/2015  . ADD (attention deficit disorder)   . Anxiety   . Arthritis   . Chronic headache   . Depression   . Depression with anxiety 12/30/2011  . Edema   . GERD (gastroesophageal reflux disease)   . Heart murmur   . Heavy periods   . History of pneumonia    patient doesnt remember this  . HLD (hyperlipidemia) 10/21/2016  . Hypertension   . Hypothyroidism   . Iron deficiency anemia   . Joint pain   . Thyroid nodule    ultrasound 10/13  . Vitamin D deficiency     Past Surgical History:  Procedure Laterality Date  . tooth implant  2016  . TUBAL LIGATION  ~2000  . WISDOM TOOTH EXTRACTION      MEDICATIONS: . cyclobenzaprine (FLEXERIL) 5 MG tablet  . ferrous sulfate 325 (65 FE) MG tablet  . ibuprofen (ADVIL,MOTRIN) 200 MG  tablet  . levothyroxine (SYNTHROID, LEVOTHROID) 200 MCG tablet  . lisinopril-hydrochlorothiazide (PRINZIDE,ZESTORETIC) 10-12.5 MG tablet  . predniSONE (STERAPRED UNI-PAK 21 TAB) 10 MG (21) TBPK tablet  . Vitamin D, Ergocalciferol, (DRISDOL) 1.25 MG (50000 UT) CAPS capsule   No current facility-administered medications for this encounter.     Myra Gianotti, PA-C Surgical Short Stay/Anesthesiology [facility] Phone [phone] [facility] Phone [phone] 12/16/2018 3:46 PM

## 2018-12-19 ENCOUNTER — Other Ambulatory Visit (INDEPENDENT_AMBULATORY_CARE_PROVIDER_SITE_OTHER): Payer: Self-pay | Admitting: Physician Assistant

## 2018-12-19 NOTE — Progress Notes (Signed)
Ebony Hail,  Thanks for the information on [facility].  I have reviewed the information about Mrs. Hew with Dr. Ninfa Linden. He states that we will proceed with surgery unless there is some contraindication at the time of surgery. Such as dermatitis or a wound that is over the surgical site.  We will defer to anesthesia in regards to her LFTs and her EKG .  Thanks for the information.   Benita Stabile PA-C [phone]

## 2018-12-20 ENCOUNTER — Encounter (INDEPENDENT_AMBULATORY_CARE_PROVIDER_SITE_OTHER): Payer: Self-pay

## 2018-12-21 ENCOUNTER — Other Ambulatory Visit (INDEPENDENT_AMBULATORY_CARE_PROVIDER_SITE_OTHER): Payer: Self-pay | Admitting: Physician Assistant

## 2018-12-26 ENCOUNTER — Other Ambulatory Visit (INDEPENDENT_AMBULATORY_CARE_PROVIDER_SITE_OTHER): Payer: Self-pay

## 2018-12-26 ENCOUNTER — Other Ambulatory Visit: Payer: Self-pay | Admitting: *Deleted

## 2018-12-26 MED ORDER — TRANEXAMIC ACID-NACL 1000-0.7 MG/100ML-% IV SOLN
1000.0000 mg | INTRAVENOUS | Status: AC
  Administered 2018-12-27: 1000 mg via INTRAVENOUS
  Filled 2018-12-26: qty 100

## 2018-12-26 NOTE — Patient Outreach (Signed)
[place] [facility]) Care Management  12/26/2018  [name] [birth year] [id number]   Preoperative  screening referral received: 12/26/18 Insurance: [place]  Subjective:  Telephone call to patient. Discussed purpose of preoperative call. Patient voices understanding and agrees to call.  She states she works form home so did not complete medical leave paperwork. She is unsure if she has the hospital indemnity benefit and will check her benefits before her surgery. Says she will have 24/7 care at home to assist in her recovery. She states she completed her advanced directives today and will take them with her to the hospital.tomorrow. She agrees to a post hospital discharge transition of care call.    Objective:  Patient is scheduled for on left total knee arthroplasty on 12/27/18 at [facility].  Assessment: Preoperative call completed, no preoperative needs identified.   Plan: RNCM will call patient for transition of care outreach within 72 hours of hospital discharge notification.  Barrington Ellison RN,CCM,CDE [place] Management Coordinator Office Phone [phone] Office Fax [phone]

## 2018-12-27 ENCOUNTER — Encounter (HOSPITAL_COMMUNITY): Payer: Self-pay

## 2018-12-27 ENCOUNTER — Inpatient Hospital Stay (HOSPITAL_COMMUNITY): Payer: No Typology Code available for payment source

## 2018-12-27 ENCOUNTER — Other Ambulatory Visit: Payer: Self-pay

## 2018-12-27 ENCOUNTER — Encounter (HOSPITAL_COMMUNITY)
Admission: RE | Disposition: A | Discharge status: Home Health | Payer: Self-pay | Source: Home / Self Care | Attending: Orthopaedic Surgery

## 2018-12-27 ENCOUNTER — Inpatient Hospital Stay (HOSPITAL_COMMUNITY): Payer: No Typology Code available for payment source | Admitting: Vascular Surgery

## 2018-12-27 ENCOUNTER — Inpatient Hospital Stay (HOSPITAL_COMMUNITY)
Admission: RE | Admit: 2018-12-27 | Discharge: 2018-12-29 | DRG: 470 | Disposition: A | Discharge status: Home Health | Payer: No Typology Code available for payment source | Attending: Orthopaedic Surgery | Admitting: Orthopaedic Surgery

## 2018-12-27 DIAGNOSIS — Z8249 Family history of ischemic heart disease and other diseases of the circulatory system: Secondary | ICD-10-CM

## 2018-12-27 DIAGNOSIS — D509 Iron deficiency anemia, unspecified: Secondary | ICD-10-CM | POA: Diagnosis present

## 2018-12-27 DIAGNOSIS — M1712 Unilateral primary osteoarthritis, left knee: Secondary | ICD-10-CM | POA: Diagnosis not present

## 2018-12-27 DIAGNOSIS — E039 Hypothyroidism, unspecified: Secondary | ICD-10-CM | POA: Diagnosis present

## 2018-12-27 DIAGNOSIS — I340 Nonrheumatic mitral (valve) insufficiency: Secondary | ICD-10-CM | POA: Diagnosis present

## 2018-12-27 DIAGNOSIS — Z823 Family history of stroke: Secondary | ICD-10-CM

## 2018-12-27 DIAGNOSIS — Z96652 Presence of left artificial knee joint: Secondary | ICD-10-CM

## 2018-12-27 DIAGNOSIS — Z9851 Tubal ligation status: Secondary | ICD-10-CM

## 2018-12-27 DIAGNOSIS — E785 Hyperlipidemia, unspecified: Secondary | ICD-10-CM | POA: Diagnosis present

## 2018-12-27 DIAGNOSIS — K219 Gastro-esophageal reflux disease without esophagitis: Secondary | ICD-10-CM | POA: Diagnosis present

## 2018-12-27 DIAGNOSIS — D62 Acute posthemorrhagic anemia: Secondary | ICD-10-CM | POA: Diagnosis not present

## 2018-12-27 DIAGNOSIS — I119 Hypertensive heart disease without heart failure: Secondary | ICD-10-CM | POA: Diagnosis present

## 2018-12-27 DIAGNOSIS — E559 Vitamin D deficiency, unspecified: Secondary | ICD-10-CM | POA: Diagnosis present

## 2018-12-27 DIAGNOSIS — F988 Other specified behavioral and emotional disorders with onset usually occurring in childhood and adolescence: Secondary | ICD-10-CM | POA: Diagnosis present

## 2018-12-27 HISTORY — DX: Pneumonia, unspecified organism: J18.9

## 2018-12-27 HISTORY — PX: TOTAL KNEE ARTHROPLASTY: SHX125

## 2018-12-27 LAB — POCT PREGNANCY, URINE: Preg Test, Ur: NEGATIVE

## 2018-12-27 SURGERY — ARTHROPLASTY, KNEE, TOTAL
Anesthesia: Spinal | Site: Knee | Laterality: Left

## 2018-12-27 MED ORDER — LEVOTHYROXINE SODIUM 100 MCG PO TABS
200.0000 ug | ORAL_TABLET | Freq: Every day | ORAL | Status: DC
  Administered 2018-12-28 – 2018-12-29 (×2): 200 ug via ORAL
  Filled 2018-12-27 (×2): qty 2

## 2018-12-27 MED ORDER — HYDROMORPHONE HCL 1 MG/ML IJ SOLN: 0.5000 mg | INTRAMUSCULAR | Status: DC | PRN

## 2018-12-27 MED ORDER — ACETAMINOPHEN 160 MG/5ML PO SOLN: 325.0000 mg | ORAL | Status: DC | PRN

## 2018-12-27 MED ORDER — ONDANSETRON HCL 4 MG PO TABS: 4.0000 mg | ORAL_TABLET | Freq: Four times a day (QID) | ORAL | Status: DC | PRN

## 2018-12-27 MED ORDER — BUPIVACAINE IN DEXTROSE 0.75-8.25 % IT SOLN
INTRATHECAL | Status: DC | PRN
  Administered 2018-12-27: 1.5 mL via INTRATHECAL

## 2018-12-27 MED ORDER — PROPOFOL 10 MG/ML IV BOLUS
INTRAVENOUS | Status: DC | PRN
  Administered 2018-12-27: 30 mg via INTRAVENOUS
  Administered 2018-12-27: 50 mg via INTRAVENOUS

## 2018-12-27 MED ORDER — POLYETHYLENE GLYCOL 3350 17 G PO PACK: 17.0000 g | PACK | Freq: Every day | ORAL | Status: DC | PRN

## 2018-12-27 MED ORDER — ONDANSETRON HCL 4 MG/2ML IJ SOLN: 4.0000 mg | Freq: Four times a day (QID) | INTRAMUSCULAR | Status: DC | PRN

## 2018-12-27 MED ORDER — ALUM & MAG HYDROXIDE-SIMETH 200-200-20 MG/5ML PO SUSP: 30.0000 mL | ORAL | Status: DC | PRN

## 2018-12-27 MED ORDER — 0.9 % SODIUM CHLORIDE (POUR BTL) OPTIME
TOPICAL | Status: DC | PRN
  Administered 2018-12-27: 1000 mL

## 2018-12-27 MED ORDER — KETOROLAC TROMETHAMINE 15 MG/ML IJ SOLN
INTRAMUSCULAR | Status: AC
  Filled 2018-12-27: qty 1

## 2018-12-27 MED ORDER — MEPERIDINE HCL 50 MG/ML IJ SOLN: 6.2500 mg | INTRAMUSCULAR | Status: DC | PRN

## 2018-12-27 MED ORDER — LISINOPRIL 10 MG PO TABS
10.0000 mg | ORAL_TABLET | Freq: Every day | ORAL | Status: DC
  Administered 2018-12-27 – 2018-12-29 (×3): 10 mg via ORAL
  Filled 2018-12-27 (×3): qty 1

## 2018-12-27 MED ORDER — OXYCODONE HCL 5 MG PO TABS
ORAL_TABLET | ORAL | Status: AC
  Administered 2018-12-27: 5 mg via ORAL
  Filled 2018-12-27: qty 1

## 2018-12-27 MED ORDER — METOCLOPRAMIDE HCL 5 MG/ML IJ SOLN: 5.0000 mg | Freq: Three times a day (TID) | INTRAMUSCULAR | Status: DC | PRN

## 2018-12-27 MED ORDER — ZOLPIDEM TARTRATE 5 MG PO TABS: 5.0000 mg | ORAL_TABLET | Freq: Every evening | ORAL | Status: DC | PRN

## 2018-12-27 MED ORDER — FERROUS SULFATE 325 (65 FE) MG PO TABS
325.0000 mg | ORAL_TABLET | Freq: Every evening | ORAL | Status: DC
  Administered 2018-12-27 – 2018-12-28 (×2): 325 mg via ORAL
  Filled 2018-12-27 (×2): qty 1

## 2018-12-27 MED ORDER — CEFAZOLIN SODIUM-DEXTROSE 2-4 GM/100ML-% IV SOLN
2.0000 g | INTRAVENOUS | Status: AC
  Administered 2018-12-27: 2 g via INTRAVENOUS
  Filled 2018-12-27: qty 100

## 2018-12-27 MED ORDER — ROPIVACAINE HCL 7.5 MG/ML IJ SOLN
INTRAMUSCULAR | Status: DC | PRN
  Administered 2018-12-27: 30 mL via PERINEURAL

## 2018-12-27 MED ORDER — MENTHOL 3 MG MT LOZG: 1.0000 | LOZENGE | OROMUCOSAL | Status: DC | PRN

## 2018-12-27 MED ORDER — MIDAZOLAM HCL 2 MG/2ML IJ SOLN
2.0000 mg | Freq: Once | INTRAMUSCULAR | Status: AC
  Administered 2018-12-27: 2 mg via INTRAVENOUS
  Filled 2018-12-27: qty 2

## 2018-12-27 MED ORDER — LACTATED RINGERS IV SOLN
INTRAVENOUS | Status: DC | PRN
  Administered 2018-12-27: 11:00:00 via INTRAVENOUS

## 2018-12-27 MED ORDER — HYDROMORPHONE HCL 1 MG/ML IJ SOLN
0.2500 mg | INTRAMUSCULAR | Status: AC | PRN
  Administered 2018-12-27 (×4): 0.5 mg via INTRAVENOUS

## 2018-12-27 MED ORDER — MIDAZOLAM HCL 2 MG/2ML IJ SOLN
INTRAMUSCULAR | Status: AC
  Filled 2018-12-27: qty 2

## 2018-12-27 MED ORDER — LIDOCAINE 2% (20 MG/ML) 5 ML SYRINGE
INTRAMUSCULAR | Status: AC
  Filled 2018-12-27: qty 10

## 2018-12-27 MED ORDER — HYDROCHLOROTHIAZIDE 12.5 MG PO CAPS
12.5000 mg | ORAL_CAPSULE | Freq: Every day | ORAL | Status: DC
  Administered 2018-12-27 – 2018-12-29 (×3): 12.5 mg via ORAL
  Filled 2018-12-27 (×3): qty 1

## 2018-12-27 MED ORDER — SODIUM CHLORIDE 0.9 % IR SOLN
Status: DC | PRN
  Administered 2018-12-27: 1

## 2018-12-27 MED ORDER — SODIUM CHLORIDE 0.9 % IV SOLN
INTRAVENOUS | Status: DC
  Administered 2018-12-27: 19:00:00 via INTRAVENOUS

## 2018-12-27 MED ORDER — LIDOCAINE 2% (20 MG/ML) 5 ML SYRINGE
INTRAMUSCULAR | Status: DC | PRN
  Administered 2018-12-27: 20 mg via INTRAVENOUS

## 2018-12-27 MED ORDER — OXYCODONE HCL 5 MG PO TABS
10.0000 mg | ORAL_TABLET | ORAL | Status: DC | PRN
  Administered 2018-12-27 – 2018-12-28 (×2): 10 mg via ORAL
  Administered 2018-12-28 – 2018-12-29 (×6): 15 mg via ORAL
  Filled 2018-12-27 (×6): qty 3

## 2018-12-27 MED ORDER — ACETAMINOPHEN 325 MG PO TABS: 325.0000 mg | ORAL_TABLET | Freq: Four times a day (QID) | ORAL | Status: DC | PRN

## 2018-12-27 MED ORDER — PHENYLEPHRINE HCL 10 MG/ML IJ SOLN
INTRAMUSCULAR | Status: DC | PRN
  Administered 2018-12-27 (×2): 40 ug via INTRAVENOUS
  Administered 2018-12-27 (×2): 80 ug via INTRAVENOUS

## 2018-12-27 MED ORDER — MIDAZOLAM HCL 2 MG/2ML IJ SOLN
INTRAMUSCULAR | Status: DC | PRN
  Administered 2018-12-27 (×2): 1 mg via INTRAVENOUS

## 2018-12-27 MED ORDER — DOCUSATE SODIUM 100 MG PO CAPS
100.0000 mg | ORAL_CAPSULE | Freq: Two times a day (BID) | ORAL | Status: DC
  Administered 2018-12-27 – 2018-12-29 (×4): 100 mg via ORAL
  Filled 2018-12-27 (×4): qty 1

## 2018-12-27 MED ORDER — CHLORHEXIDINE GLUCONATE 4 % EX LIQD: 60.0000 mL | Freq: Once | CUTANEOUS | Status: DC

## 2018-12-27 MED ORDER — OXYCODONE HCL 5 MG PO TABS
5.0000 mg | ORAL_TABLET | ORAL | Status: DC | PRN
  Administered 2018-12-27 – 2018-12-28 (×3): 10 mg via ORAL
  Filled 2018-12-27 (×4): qty 2

## 2018-12-27 MED ORDER — DIPHENHYDRAMINE HCL 12.5 MG/5ML PO ELIX: 12.5000 mg | ORAL_SOLUTION | ORAL | Status: DC | PRN

## 2018-12-27 MED ORDER — METHOCARBAMOL 500 MG PO TABS
500.0000 mg | ORAL_TABLET | Freq: Four times a day (QID) | ORAL | Status: DC | PRN
  Administered 2018-12-27 – 2018-12-29 (×6): 500 mg via ORAL
  Filled 2018-12-27 (×5): qty 1

## 2018-12-27 MED ORDER — PANTOPRAZOLE SODIUM 40 MG PO TBEC
40.0000 mg | DELAYED_RELEASE_TABLET | Freq: Every day | ORAL | Status: DC
  Administered 2018-12-27 – 2018-12-29 (×3): 40 mg via ORAL
  Filled 2018-12-27 (×3): qty 1

## 2018-12-27 MED ORDER — PHENOL 1.4 % MT LIQD: 1.0000 | OROMUCOSAL | Status: DC | PRN

## 2018-12-27 MED ORDER — HYDROMORPHONE HCL 1 MG/ML IJ SOLN
INTRAMUSCULAR | Status: AC
  Administered 2018-12-27: 0.5 mg via INTRAVENOUS
  Filled 2018-12-27: qty 1

## 2018-12-27 MED ORDER — METHOCARBAMOL 500 MG PO TABS
ORAL_TABLET | ORAL | Status: AC
  Filled 2018-12-27: qty 1

## 2018-12-27 MED ORDER — CEFAZOLIN SODIUM-DEXTROSE 1-4 GM/50ML-% IV SOLN
1.0000 g | Freq: Four times a day (QID) | INTRAVENOUS | Status: AC
  Administered 2018-12-27 (×2): 1 g via INTRAVENOUS
  Filled 2018-12-27 (×2): qty 50

## 2018-12-27 MED ORDER — OXYCODONE HCL 5 MG PO TABS
ORAL_TABLET | ORAL | Status: AC
  Filled 2018-12-27: qty 2

## 2018-12-27 MED ORDER — FENTANYL CITRATE (PF) 250 MCG/5ML IJ SOLN
INTRAMUSCULAR | Status: DC | PRN
  Administered 2018-12-27: 25 ug via INTRAVENOUS
  Administered 2018-12-27: 50 ug via INTRAVENOUS

## 2018-12-27 MED ORDER — EPHEDRINE SULFATE 50 MG/ML IJ SOLN
INTRAMUSCULAR | Status: DC | PRN
  Administered 2018-12-27 (×2): 5 mg via INTRAVENOUS

## 2018-12-27 MED ORDER — CLONIDINE HCL (ANALGESIA) 100 MCG/ML EP SOLN
EPIDURAL | Status: DC | PRN
  Administered 2018-12-27: 100 ug

## 2018-12-27 MED ORDER — OXYCODONE HCL 5 MG/5ML PO SOLN: 5.0000 mg | Freq: Once | ORAL | Status: AC | PRN

## 2018-12-27 MED ORDER — ASPIRIN EC 325 MG PO TBEC
325.0000 mg | DELAYED_RELEASE_TABLET | Freq: Two times a day (BID) | ORAL | Status: DC
  Administered 2018-12-27 – 2018-12-29 (×4): 325 mg via ORAL
  Filled 2018-12-27 (×4): qty 1

## 2018-12-27 MED ORDER — METOCLOPRAMIDE HCL 5 MG PO TABS: 5.0000 mg | ORAL_TABLET | Freq: Three times a day (TID) | ORAL | Status: DC | PRN

## 2018-12-27 MED ORDER — OXYCODONE HCL 5 MG PO TABS
5.0000 mg | ORAL_TABLET | Freq: Once | ORAL | Status: AC | PRN
  Administered 2018-12-27: 5 mg via ORAL

## 2018-12-27 MED ORDER — METHOCARBAMOL 1000 MG/10ML IJ SOLN
500.0000 mg | Freq: Four times a day (QID) | INTRAVENOUS | Status: DC | PRN
  Filled 2018-12-27: qty 5

## 2018-12-27 MED ORDER — FENTANYL CITRATE (PF) 250 MCG/5ML IJ SOLN
INTRAMUSCULAR | Status: AC
  Filled 2018-12-27: qty 5

## 2018-12-27 MED ORDER — EPHEDRINE 5 MG/ML INJ
INTRAVENOUS | Status: AC
  Filled 2018-12-27: qty 10

## 2018-12-27 MED ORDER — PHENYLEPHRINE 40 MCG/ML (10ML) SYRINGE FOR IV PUSH (FOR BLOOD PRESSURE SUPPORT)
PREFILLED_SYRINGE | INTRAVENOUS | Status: AC
  Filled 2018-12-27: qty 10

## 2018-12-27 MED ORDER — ACETAMINOPHEN 325 MG PO TABS: 325.0000 mg | ORAL_TABLET | ORAL | Status: DC | PRN

## 2018-12-27 MED ORDER — ONDANSETRON HCL 4 MG/2ML IJ SOLN: 4.0000 mg | Freq: Once | INTRAMUSCULAR | Status: DC | PRN

## 2018-12-27 MED ORDER — FENTANYL CITRATE (PF) 100 MCG/2ML IJ SOLN: 25.0000 ug | INTRAMUSCULAR | Status: DC | PRN

## 2018-12-27 MED ORDER — KETOROLAC TROMETHAMINE 15 MG/ML IJ SOLN
15.0000 mg | Freq: Four times a day (QID) | INTRAMUSCULAR | Status: DC
  Administered 2018-12-27 – 2018-12-29 (×6): 15 mg via INTRAVENOUS
  Filled 2018-12-27 (×6): qty 1

## 2018-12-27 MED ORDER — LISINOPRIL-HYDROCHLOROTHIAZIDE 10-12.5 MG PO TABS: 1.0000 | ORAL_TABLET | Freq: Every day | ORAL | Status: DC

## 2018-12-27 MED ORDER — PROPOFOL 500 MG/50ML IV EMUL
INTRAVENOUS | Status: DC | PRN
  Administered 2018-12-27: 100 ug/kg/min via INTRAVENOUS

## 2018-12-27 MED ORDER — FENTANYL CITRATE (PF) 100 MCG/2ML IJ SOLN
100.0000 ug | Freq: Once | INTRAMUSCULAR | Status: AC
  Administered 2018-12-27: 100 ug via INTRAVENOUS
  Filled 2018-12-27: qty 2

## 2018-12-27 SURGICAL SUPPLY — 65 items
BANDAGE ACE 6X5 VEL STRL LF (GAUZE/BANDAGES/DRESSINGS) ×6 IMPLANT
BANDAGE ESMARK 6X9 LF (GAUZE/BANDAGES/DRESSINGS) ×1 IMPLANT
BEARIN TIBIAL TRIATH (Orthopedic Implant) ×3 IMPLANT
BEARING TIBIAL TRIATH (Orthopedic Implant) IMPLANT
BLADE SAG 18X100X1.27 (BLADE) ×3 IMPLANT
BNDG ESMARK 6X9 LF (GAUZE/BANDAGES/DRESSINGS) ×3
BOWL SMART MIX CTS (DISPOSABLE) ×3 IMPLANT
CLOSURE WOUND 1/2 X4 (GAUZE/BANDAGES/DRESSINGS)
COMP POSTERIOR FEMORAL SZ4 LFT (Femur) ×3 IMPLANT
COMPONENT PSTRER FEMRL SZ4 LT (Femur) IMPLANT
COVER SURGICAL LIGHT HANDLE (MISCELLANEOUS) ×3 IMPLANT
COVER WAND RF STERILE (DRAPES) ×1 IMPLANT
CUFF TOURNIQUET SINGLE 34IN LL (TOURNIQUET CUFF) ×3 IMPLANT
CUFF TOURNIQUET SINGLE 44IN (TOURNIQUET CUFF) IMPLANT
DRAPE EXTREMITY T 121X128X90 (DISPOSABLE) ×3 IMPLANT
DRAPE HALF SHEET 40X57 (DRAPES) ×3 IMPLANT
DRAPE U-SHAPE 47X51 STRL (DRAPES) ×3 IMPLANT
DRSG PAD ABDOMINAL 8X10 ST (GAUZE/BANDAGES/DRESSINGS) ×3 IMPLANT
DURAPREP 26ML APPLICATOR (WOUND CARE) ×5 IMPLANT
ELECT CAUTERY BLADE 6.4 (BLADE) ×3 IMPLANT
ELECT REM PT RETURN 9FT ADLT (ELECTROSURGICAL) ×3
ELECTRODE REM PT RTRN 9FT ADLT (ELECTROSURGICAL) ×1 IMPLANT
FACESHIELD WRAPAROUND (MASK) ×9 IMPLANT
FACESHIELD WRAPAROUND OR TEAM (MASK) ×2 IMPLANT
GAUZE SPONGE 4X4 12PLY STRL (GAUZE/BANDAGES/DRESSINGS) ×3 IMPLANT
GAUZE XEROFORM 1X8 LF (GAUZE/BANDAGES/DRESSINGS) ×3 IMPLANT
GLOVE BIOGEL PI IND STRL 8 (GLOVE) ×2 IMPLANT
GLOVE BIOGEL PI INDICATOR 8 (GLOVE) ×4
GLOVE ORTHO TXT STRL SZ7.5 (GLOVE) ×3 IMPLANT
GLOVE SURG ORTHO 8.0 STRL STRW (GLOVE) ×3 IMPLANT
GOWN STRL REUS W/ TWL LRG LVL3 (GOWN DISPOSABLE) IMPLANT
GOWN STRL REUS W/ TWL XL LVL3 (GOWN DISPOSABLE) ×2 IMPLANT
GOWN STRL REUS W/TWL LRG LVL3 (GOWN DISPOSABLE) ×4
GOWN STRL REUS W/TWL XL LVL3 (GOWN DISPOSABLE) ×4
HANDPIECE INTERPULSE COAX TIP (DISPOSABLE) ×2
IMMOBILIZER KNEE 22 UNIV (SOFTGOODS) ×3 IMPLANT
KIT BASIN OR (CUSTOM PROCEDURE TRAY) ×3 IMPLANT
KIT TURNOVER KIT B (KITS) ×3 IMPLANT
KNEE PATELLA ASYMMETRIC 9X29 (Knees) ×2 IMPLANT
KNEE TIBIAL COMP TRI SZ4 (Knees) ×2 IMPLANT
MANIFOLD NEPTUNE II (INSTRUMENTS) ×3 IMPLANT
NDL SAFETY ECLIPSE 18X1.5 (NEEDLE) IMPLANT
NEEDLE HYPO 18GX1.5 SHARP (NEEDLE)
NS IRRIG 1000ML POUR BTL (IV SOLUTION) ×3 IMPLANT
PACK TOTAL JOINT (CUSTOM PROCEDURE TRAY) ×3 IMPLANT
PAD ARMBOARD 7.5X6 YLW CONV (MISCELLANEOUS) ×3 IMPLANT
PADDING CAST COTTON 6X4 STRL (CAST SUPPLIES) ×3 IMPLANT
SET HNDPC FAN SPRY TIP SCT (DISPOSABLE) ×1 IMPLANT
SET PAD KNEE POSITIONER (MISCELLANEOUS) ×3 IMPLANT
STAPLER VISISTAT 35W (STAPLE) IMPLANT
STRIP CLOSURE SKIN 1/2X4 (GAUZE/BANDAGES/DRESSINGS) IMPLANT
SUCTION FRAZIER HANDLE 10FR (MISCELLANEOUS) ×2
SUCTION TUBE FRAZIER 10FR DISP (MISCELLANEOUS) ×1 IMPLANT
SUT MNCRL AB 4-0 PS2 18 (SUTURE) ×2 IMPLANT
SUT VIC AB 0 CT1 27 (SUTURE) ×2
SUT VIC AB 0 CT1 27XBRD ANBCTR (SUTURE) ×1 IMPLANT
SUT VIC AB 1 CT1 27 (SUTURE) ×6
SUT VIC AB 1 CT1 27XBRD ANBCTR (SUTURE) ×2 IMPLANT
SUT VIC AB 2-0 CT1 27 (SUTURE) ×4
SUT VIC AB 2-0 CT1 TAPERPNT 27 (SUTURE) ×2 IMPLANT
SYR 50ML LL SCALE MARK (SYRINGE) IMPLANT
TOWEL OR 17X24 6PK STRL BLUE (TOWEL DISPOSABLE) ×3 IMPLANT
TOWEL OR 17X26 10 PK STRL BLUE (TOWEL DISPOSABLE) ×3 IMPLANT
TRAY CATH 16FR W/PLASTIC CATH (SET/KITS/TRAYS/PACK) ×2 IMPLANT
WRAP KNEE MAXI GEL POST OP (GAUZE/BANDAGES/DRESSINGS) ×3 IMPLANT

## 2018-12-27 NOTE — Anesthesia Procedure Notes (Signed)
Spinal  Patient location during procedure: OR Start time: 12/27/2018 12:00 PM End time: 12/27/2018 12:05 PM Staffing Anesthesiologist: Janeece Riggers, MD Preanesthetic Checklist Completed: patient identified, site marked, surgical consent, pre-op evaluation, timeout performed, IV checked, risks and benefits discussed and monitors and equipment checked Spinal Block Patient position: sitting Prep: DuraPrep Patient monitoring: heart rate, cardiac monitor, continuous pulse ox and blood pressure Approach: midline Location: L3-4 Injection technique: single-shot Needle Needle type: Sprotte  Needle gauge: 24 G Needle length: 9 cm Assessment Sensory level: T4

## 2018-12-27 NOTE — Anesthesia Preprocedure Evaluation (Addendum)
Anesthesia Evaluation  Patient identified by MRN, date of birth, ID band Patient awake    Reviewed: Allergy & Precautions, H&P , NPO status , Patient's Chart, lab work & pertinent test results, reviewed documented beta blocker date and time   Airway Mallampati: II  TM Distance: >3 FB Neck ROM: full    Dental no notable dental hx. (+) Teeth Intact   Pulmonary neg pulmonary ROS,    Pulmonary exam normal breath sounds clear to auscultation       Cardiovascular Exercise Tolerance: Good hypertension, Pt. on medications negative cardio ROS Normal cardiovascular exam Rhythm:regular Rate:Normal  EKG: 07/14/18: SR, T wave abnormalitly, possible anterolateral ischemia. Mild T wave inversion/falttening in inferior leads as well which are more non-specific. T wave changes are more pronounced when compared to 2017-06-25 EKG but similar to 10/02/15 tracing.   CV: 24 Hour holter monitor 06/10/17:  Telemetry tracings show SR with PVCs  Min HR 48, max HR 129, Avg HR 80  Rare supraventricular ectopy  Moderate ventricular ectopy (14%). Primarily as isolated PVCs, bigeiminy, and trigeminy. No NSVT or VT  Reported symptoms of palpitations and SOB correlate with SR and PVCs  Echo 11/20/15: Study Conclusions - Left ventricle: The cavity size was normal. Wall thickness was increased in a pattern of mild LVH. Systolic function was normal. The estimated ejection fraction was in the range of 60% to 65%. Wall motion was normal; there were no regional wall motion abnormalities. Left ventricular diastolic function parameters were normal. - Mitral valve: No echocardiographic evidence for prolapse. There was mild regurgitation. - Tricuspid valve: There was trivial regurgitation.    Neuro/Psych  Headaches, PSYCHIATRIC DISORDERS Anxiety Depression    GI/Hepatic Neg liver ROS, GERD  ,  Endo/Other  Hypothyroidism   Renal/GU negative Renal  ROS  negative genitourinary   Musculoskeletal  (+) Arthritis , Osteoarthritis,    Abdominal   Peds  Hematology  (+) Blood dyscrasia, anemia ,   Anesthesia Other Findings   Reproductive/Obstetrics negative OB ROS (+) Pregnancy                            Anesthesia Physical Anesthesia Plan  ASA: II  Anesthesia Plan: Spinal   Post-op Pain Management:  Regional for Post-op pain   Induction:   PONV Risk Score and Plan: 2 and Ondansetron, Treatment may vary due to age or medical condition and Dexamethasone  Airway Management Planned: Nasal Cannula, Natural Airway and Simple Face Mask  Additional Equipment:   Intra-op Plan:   Post-operative Plan:   Informed Consent: I have reviewed the patients History and Physical, chart, labs and discussed the procedure including the risks, benefits and alternatives for the proposed anesthesia with the patient or authorized representative who has indicated his/her understanding and acceptance.   Dental Advisory Given  Plan Discussed with: CRNA, Anesthesiologist and Surgeon  Anesthesia Plan Comments:         Anesthesia Quick Evaluation

## 2018-12-27 NOTE — Transfer of Care (Signed)
Immediate Anesthesia Transfer of Care Note  Patient: [name]  Procedure(s) Performed: LEFT TOTAL KNEE ARTHROPLASTY (Left Knee)  Patient Location: PACU  Anesthesia Type:MAC combined with regional for post-op pain  Level of Consciousness: awake, alert , oriented and patient cooperative  Airway & Oxygen Therapy: Patient Spontanous Breathing and Patient connected to face mask oxygen  Post-op Assessment: Report given to RN and Post -op Vital signs reviewed and stable  Post vital signs: Reviewed and stable  Last Vitals:  Vitals Value Taken Time  BP    Temp    Pulse    Resp    SpO2      Last Pain:  Vitals:   12/27/18 1006  TempSrc: Oral  PainSc: 4       Patients Stated Pain Goal: 4 (87/86/76 7209)  Complications: No apparent anesthesia complications

## 2018-12-27 NOTE — Progress Notes (Signed)
Orthopedic Tech Progress Note Patient Details:  [name] Sep 30, 1971 [id number]  Ortho Devices Ortho Device/Splint Interventions: Application   Post Interventions Patient Tolerated: Well Instructions Provided: Adjustment of device, Care of device   LARRAINE ARGO 12/27/2018, 4:37 PM

## 2018-12-27 NOTE — Progress Notes (Signed)
Orthopedic Tech Progress Note Patient Details:  [name] 12-26-1970 [id number] CPM [place] to pt.     Post Interventions Patient Tolerated: Well Instructions Provided: Adjustment of device, Care of device   Zeta Bucy J Alvey Brockel 12/27/2018, 3:38 PM

## 2018-12-27 NOTE — H&P (Signed)
TOTAL KNEE ADMISSION H&P  Patient is being admitted for left total knee arthroplasty.  Subjective:  Chief Complaint:left knee pain.  HPI: [name], 48 y.o. female, has a history of pain and functional disability in the left knee due to trauma and arthritis and has failed non-surgical conservative treatments for greater than 12 weeks to includeNSAID's and/or analgesics, corticosteriod injections, flexibility and strengthening excercises, weight reduction as appropriate and activity modification.  Onset of symptoms was abrupt, starting 2 years ago with gradually worsening course since that time. The patient noted no past surgery on the left knee(s).  Patient currently rates pain in the left knee(s) at 10 out of 10 with activity. Patient has .  Patient has evidence of subchondral sclerosis, periarticular osteophytes and joint space narrowing by imaging studies. There is no active infection.  Patient Active Problem List   Diagnosis Date Noted  . Unilateral primary osteoarthritis, left knee 11/01/2018  . Primary osteoarthritis of left knee 11/15/2017  . Dense breast tissue on mammogram 10/21/2016  . HLD (hyperlipidemia) 10/21/2016  . LVH (left ventricular hypertrophy) 10/21/2016  . Mitral regurgitation 10/21/2016  . Abnormal uterine bleeding (AUB) 10/11/2015  . Hypertension 10/01/2015  . Menorrhagia with regular cycle 06/26/2014  . ADD (attention deficit disorder) 02/18/2012  . Hypothyroidism 12/30/2011   Past Medical History:  Diagnosis Date  . Abnormal uterine bleeding (AUB) 10/11/2015  . ADD (attention deficit disorder)   . Anxiety   . Arthritis   . Chronic headache   . Depression   . Depression with anxiety 12/30/2011  . Edema   . GERD (gastroesophageal reflux disease)   . Heart murmur   . Heavy periods   . History of pneumonia    patient doesnt remember this  . HLD (hyperlipidemia) 10/21/2016  . Hypertension   . Hypothyroidism   . Iron deficiency anemia   . Joint pain    . Thyroid nodule    ultrasound 10/13  . Vitamin D deficiency     Past Surgical History:  Procedure Laterality Date  . tooth implant  2016  . TUBAL LIGATION  ~2000  . WISDOM TOOTH EXTRACTION      Current Facility-Administered Medications  Medication Dose Route Frequency Provider Last Rate Last Dose  . ceFAZolin (ANCEF) IVPB 2g/100 mL premix  2 g Intravenous On Call to OR Pete Pelt, PA-C      . chlorhexidine (HIBICLENS) 4 % liquid 4 application  60 mL Topical Once Erskine Emery W, PA-C      . tranexamic acid (CYKLOKAPRON) IVPB 1,000 mg  1,000 mg Intravenous To OR Mcarthur Rossetti, MD       Facility-Administered Medications Ordered in Other Encounters  Medication Dose Route Frequency Provider Last Rate Last Dose  . cloNIDine (DURACLON) 100 mcg/mL inj- OR Only    Anesthesia Stephannie Li, MD   100 mcg at 12/27/18 1128  . ropivacaine (PF) 7.5 mg/mL (0.75%) (NAROPIN) injection    Anesthesia Intra-op Janeece Riggers, MD   30 mL at 12/27/18 1128   No Known Allergies  Social History   Tobacco Use  . Smoking status: Never Smoker  . Smokeless tobacco: Never Used  Substance Use Topics  . Alcohol use: Yes    Alcohol/week: 3.0 standard drinks    Types: 3 Glasses of wine per week    Comment: rarely    Family History  Problem Relation Age of Onset  . Other Son        hx/o guillian barre  .  Hearing loss Father   . Heart disease Maternal Grandmother   . Heart disease Maternal Grandfather   . Stroke Paternal Grandmother   . Stroke Paternal Grandfather   . Cancer Neg Hx   . Diabetes Neg Hx      Review of Systems  Musculoskeletal: Positive for joint pain.  All other systems reviewed and are negative.   Objective:  Physical Exam  Constitutional: She is oriented to person, place, and time. She appears well-developed and well-nourished.  HENT:  Head: Normocephalic and atraumatic.  Eyes: Pupils are equal, round, and reactive to light.  Neck: Normal range of  motion.  Cardiovascular: Normal rate.  Respiratory: Effort normal.  GI: Soft.  Musculoskeletal:     Left knee: She exhibits decreased range of motion, swelling, bony tenderness and abnormal meniscus. Tenderness found. Medial joint line, lateral joint line and patellar tendon tenderness noted.  Neurological: She is alert and oriented to person, place, and time.  Skin: Skin is warm and dry.  Psychiatric: She has a normal mood and affect.    Vital signs in last 24 hours: Temp:  [98.9 F (37.2 C)] 98.9 F (37.2 C) (01/14 1006) Pulse Rate:  [74] 74 (01/14 1006) Resp:  [16] 16 (01/14 1006) BP: (158)/(91) 158/91 (01/14 1006) SpO2:  [98 %] 98 % (01/14 1006) Weight:  [102.4 kg] 102.4 kg (01/14 1006)  Labs:   Estimated body mass index is 36.43 kg/m as calculated from the following:   Height as of this encounter: 5\' 6"  (1.676 m).   Weight as of this encounter: 102.4 kg.   Imaging Review Plain radiographs demonstrate severe degenerative joint disease of the left knee(s). The overall alignment isneutral. The bone quality appears to be excellent for age and reported activity level.   Preoperative templating of the joint replacement has been completed, documented, and submitted to the Operating Room personnel in order to optimize intra-operative equipment management.    Patient's anticipated LOS is less than 2 midnights, meeting these requirements: - Younger than 65 - Lives within 1 hour of care - Has a competent adult at home to recover with post-op recover - NO history of  - Chronic pain requiring opiods  - Diabetes  - Coronary Artery Disease  - Heart failure  - Heart attack  - Stroke  - DVT/VTE  - Cardiac arrhythmia  - Respiratory Failure/COPD  - Renal failure  - Anemia  - Advanced Liver disease        Assessment/Plan:  End stage arthritis, left knee   The patient history, physical examination, clinical judgment of the provider and imaging studies are consistent  with end stage degenerative joint disease of the left knee(s) and total knee arthroplasty is deemed medically necessary. The treatment options including medical management, injection therapy arthroscopy and arthroplasty were discussed at length. The risks and benefits of total knee arthroplasty were presented and reviewed. The risks due to aseptic loosening, infection, stiffness, patella tracking problems, thromboembolic complications and other imponderables were discussed. The patient acknowledged the explanation, agreed to proceed with the plan and consent was signed. Patient is being admitted for inpatient treatment for surgery, pain control, PT, OT, prophylactic antibiotics, VTE prophylaxis, progressive ambulation and ADL's and discharge planning. The patient is planning to be discharged home with home health services

## 2018-12-27 NOTE — Evaluation (Signed)
Physical Therapy Evaluation Patient Details Name: [name] MRN: [id number] DOB: Jul 12, 1971 Today's Date: 12/27/2018   History of Present Illness  pt is a 48 y/o female with h/o HTN, admitted for elective L TKA  Clinical Impression  Pt admitted with/for elective L TKA.  Pt is at a min guard level at this time.  Pt currently limited functionally due to the problems listed below.  (see problems list.)  Pt will benefit from PT to maximize function and safety to be able to get home safely with available assist.     Follow Up Recommendations Follow surgeon's recommendation for DC plan and follow-up therapies    Equipment Recommendations  Other (comment)(all equipment delivered)    Recommendations for Other Services       Precautions / Restrictions Precautions Precautions: Knee Restrictions Weight Bearing Restrictions: Yes LLE Weight Bearing: Weight bearing as tolerated      Mobility  Bed Mobility Overal bed mobility: Needs Assistance Bed Mobility: Supine to Sit;Sit to Supine     Supine to sit: Min guard Sit to supine: Min guard      Transfers Overall transfer level: Needs assistance   Transfers: Sit to/from Stand Sit to Stand: Min guard            Ambulation/Gait Ambulation/Gait assistance: Min guard Gait Distance (Feet): 60 Feet Assistive device: Rolling walker (2 wheeled) Gait Pattern/deviations: Step-to pattern   Gait velocity interpretation: <1.31 ft/sec, indicative of household ambulator General Gait Details: steady step to gait pattern'  Stairs            Wheelchair Mobility    Modified Rankin (Stroke Patients Only)       Balance Overall balance assessment: Needs assistance   Sitting balance-Leahy Scale: Good       Standing balance-Leahy Scale: Fair                               Pertinent Vitals/Pain Pain Assessment: 0-10 Pain Score: 7  Pain Descriptors / Indicators: Sharp;Sore;Guarding Pain Intervention(s):  Monitored during session;Premedicated before session    [place] expects to be discharged to:: Private residence Living Arrangements: Spouse/significant other Available Help at Discharge: Family;Available 24 hours/day;Available PRN/intermittently Type of Home: House Home Access: Stairs to enter Entrance Stairs-Rails: None Entrance Stairs-Number of Steps: 2 Home Layout: One level;Able to live on main level with bedroom/bathroom Home Equipment: Gilford Rile - 2 wheels;Bedside commode;Shower seat      Prior Function Level of Independence: Independent               Hand Dominance        Extremity/Trunk Assessment   Upper Extremity Assessment Upper Extremity Assessment: Overall WFL for tasks assessed    Lower Extremity Assessment Lower Extremity Assessment: Overall WFL for tasks assessed;LLE deficits/detail LLE Deficits / Details: active knee flexion to >90*, ext to -5*       Communication   Communication: No difficulties  Cognition Arousal/Alertness: Awake/alert Behavior During Therapy: WFL for tasks assessed/performed Overall Cognitive Status: Within Functional Limits for tasks assessed                                        General Comments      Exercises Total Joint Exercises Ankle Circles/Pumps: AROM;15 reps Quad Sets: AROM;5 reps Towel Squeeze: AROM;5 reps Short Arc QuadSinclair Ship;Left Heel Slides: AROM;Left;5 reps  Hip ABduction/ADduction: AROM;Left;5 reps Straight Leg Raises: AROM;Left;Other (comment)(3 rep) Knee Flexion: AROM Goniometric ROM: >90*   Assessment/Plan    PT Assessment Patient needs continued PT services  PT Problem List Decreased range of motion;Decreased activity tolerance;Decreased mobility;Decreased strength;Decreased knowledge of use of DME;Pain       PT Treatment Interventions DME instruction;Gait training;Stair training;Functional mobility training;Therapeutic activities;Therapeutic  exercise;Patient/family education    PT Goals (Current goals can be found in the Care Plan section)  Acute Rehab PT Goals Patient Stated Goal: home independent PT Goal Formulation: With patient Time For Goal Achievement: 01/03/19 Potential to Achieve Goals: Good    Frequency BID   Barriers to discharge        Co-evaluation               AM-PAC PT "6 Clicks" Mobility  Outcome Measure Help needed turning from your back to your side while in a flat bed without using bedrails?: None Help needed moving from lying on your back to sitting on the side of a flat bed without using bedrails?: A Little Help needed moving to and from a bed to a chair (including a wheelchair)?: A Little Help needed standing up from a chair using your arms (e.g., wheelchair or bedside chair)?: A Little Help needed to walk in hospital room?: A Little Help needed climbing 3-5 steps with a railing? : A Little 6 Click Score: 19    End of Session   Activity Tolerance: Patient tolerated treatment well Patient left: in bed;with call bell/phone within reach Nurse Communication: Mobility status PT Visit Diagnosis: Other abnormalities of gait and mobility (R26.89);Pain Pain - Right/Left: Left Pain - part of body: Knee    Time: 1805-1830 PT Time Calculation (min) (ACUTE ONLY): 25 min   Charges:   PT Evaluation $PT Eval Low Complexity: 1 Low PT Treatments $Gait Training: 8-22 mins        12/27/2018  Donnella Sham, PT Acute Rehabilitation Services [phone]  (pager) [phone]  (office)  Tessie Fass Aoi Kouns 12/27/2018, 6:40 PM

## 2018-12-27 NOTE — Op Note (Signed)
NAME: [name], [name]:[id number] ACCOUNT [id number] DATE OF BIRTH:January 20, 1971 FACILITY: [facility] LOCATION: [facility] PHYSICIAN:Sigrid Schwebach Kerry Fort, MD  OPERATIVE REPORT  DATE OF PROCEDURE:  12/27/2018  PREOPERATIVE DIAGNOSIS:  Primary osteoarthritis and degenerative joint disease, left knee.  POSTOPERATIVE DIAGNOSIS:  Primary osteoarthritis and degenerative joint disease, left knee.  PROCEDURE:  Left total knee arthroplasty.  IMPLANTS:  Stryker press-fit knee system with size 4 femur, size 4 tibial tray, 11 mm thickness fixed bearing polyethylene insert, size 29 press-fit patellar button.  SURGEON:  Lind Guest. Ninfa Linden, MD  ASSISTANT:  Erskine Emery, PA-C  ANESTHESIA: 1.  Left lower extremity adductor canal block. 2.  Spinal.  ANTIBIOTICS:  Two grams IV Ancef.  TOURNIQUET TIME:  Under 1 hour.  ESTIMATED BLOOD LOSS:  Less than 100 mL.  COMPLICATIONS:  None.  INDICATIONS:  The patient is only a 48 year old female who surprisingly has debilitating arthritis involving her left knee.  In 2018 she injured this knee with a dog clipped her.  She did sustain a small meniscal tear, but this was minimal.  The MRI of  her knee did show areas of full thickness cartilage loss in the patellofemoral joint and the medial femoral condyle.  There was also thinning of the cartilage in the other compartments of her knee.  She tried and failed all forms of conservative  treatment.  An arthroscopic intervention is not warranted at all because her meniscal tear was only a small root meniscal tear, but her symptoms were more consistent with osteoarthritis.  After dealing with this for over a year, she has at this point  decided to proceed with total knee arthroplasty given the failure of conservative treatment.  Her pain is daily and has detrimentally affected her mobility, quality of life and her activities of daily living.  She understands with a knee replacement  surgery at  her young age, there is certainly risk of acute blood loss anemia, nerve or vessel injury, fracture, infection, DVT, and implant failure.  She understands our goals are to decrease pain, improve mobility and overall improve quality of life.  DESCRIPTION OF PROCEDURE:  After informed consent was obtained and appropriate left knee was marked.  An adductor canal block was obtained in the holding room.  She was then brought to the operating room and placed supine on the operating table.  She was  then sat up on the operating table.  Spinal anesthesia was obtained.  She was then laid supine again.  A Foley catheter was placed and a nonsterile tourniquet was placed around the upper left thigh.  Her left thigh, knee, leg, ankle and foot were  prepped and draped with DuraPrep and sterile drapes and a sterile stockinette was utilized as well.  A time-out was called and she was identified, correct patient, correct left knee.  We then used an Esmarch to wrap that leg and tourniquet was inflated  to 300 mm of pressure.  I then made a direct midline incision over the patella and carried this proximally and distally.  I dissected down the knee joint and carried out a medial parapatellar arthrotomy finding just a mild joint effusion.  Right away  though we could see that the patellofemoral arthritis is quite severe.  With the knee in a flexed position, we found a wide area of no cartilage over the medial femoral condyle.  The remainder of the knee looked intact.  We then removed remnants of ACL,  PCL, medial and lateral meniscus.  We set  our extramedullary cutting guide for the tibia for taking 9 mm off the high side, correcting for varus and valgus in a neutral slope.  We made this cut without difficulty.  We then went to the intercondylar area  of the knee on the femur side for our distal femoral cut using intramedullary guide through a drill hole in the femur.  We set this for an 8 mm distal femoral cut setting it for  left knee at 5 degrees externally rotated.  We made this cut without  difficulty and brought the knee back down to full extension and removed more remnants of the medial and lateral meniscus from the back of the knee and placed a 9 mm extension block.  We felt pretty good about this.  We then went back to the femur and put  our femoral sizing guide based off the epicondylar axis and chose a size 4 femur.  We put our 4-in-1 cutting block for size 4 femur, made our anterior and posterior cuts followed by our chamfer cuts.  We then made our femoral box cut.  With the knee in  90 degrees of flexion, I placed a 9 mm extension block and we felt pretty good about our flexion and extension gaps.  We then went back to the tibia and chose a size 4 tibia for coverage and set the rotation off the tibial tubercle and the femur.  We  made our keel punch with a press-fit keel system off of that.  Then the size trial 4 femur, followed by the 4 tibial tray.  We placed a 9 mm fixed bearing polyethylene insert.  We felt like we needed to go just a little bit more thickness and an 11 would  be more appropriate.  We then made our patellar cut and drilled 3 holes for a press-fit size 29 patellar button.  We then removed all instrumentation from the knee and irrigated the knee with normal saline solution using pulsatile lavage.  We then dried  the knee off very well and with the knee in a flexed position, placed our press-fit size 4 Stryker tibial tray followed by our size 4 press-fit left femur.  We placed our real 11 mm fixed bearing polyethylene insert and press-fit our patellar button.   We were very pleased with stability and placement of the implants on exam.  We then let the tourniquet down.  Hemostasis obtained with electrocautery.  We irrigated the knee again and then closed the arthrotomy with interrupted #1 Vicryl suture followed  by 0 Vicryl in the deep tissue, 2-0 Vicryl subcutaneous tissue, 4-0 Monocryl subcuticular  stitch and Steri-Strips on the skin.  A well-padded bulky sterile dressing was applied.  She was taken to recovery room in stable condition.  All final counts were  correct.  There were no complications noted.  Of note, Benita Stabile, PA-C, assisted the entire case and his assistance was crucial for facilitating all aspects of this case.  TN/NUANCE  D:12/27/2018 T:12/27/2018 JOB:[id number]

## 2018-12-27 NOTE — Brief Op Note (Signed)
12/27/2018  1:29 PM  PATIENT:  [name]  48 y.o. female  PRE-OPERATIVE DIAGNOSIS:  osteoarthritis left knee  POST-OPERATIVE DIAGNOSIS:  osteoarthritis left knee  PROCEDURE:  Procedure(s): LEFT TOTAL KNEE ARTHROPLASTY (Left)  SURGEON:  Surgeon(s) and Role:    Mcarthur Rossetti, MD - Primary  PHYSICIAN ASSISTANT: Benita Stabile, PA-C  ANESTHESIA:   regional and spinal  EBL:  50 mL   COUNTS:  YES  TOURNIQUET:  * Missing tourniquet times found for documented tourniquets in log: [id number] *  DICTATION: .Other Dictation: Dictation Number [id number]  PLAN OF CARE: Admit to inpatient   PATIENT DISPOSITION:  PACU - hemodynamically stable.   Delay start of Pharmacological VTE agent (>24hrs) due to surgical blood loss or risk of bleeding: no

## 2018-12-27 NOTE — Anesthesia Procedure Notes (Signed)
Anesthesia Regional Block: Adductor canal block   Pre-Anesthetic Checklist: ,, timeout performed, Correct Patient, Correct Site, Correct Laterality, Correct Procedure, Correct Position, site marked, Risks and benefits discussed,  Surgical consent,  Pre-op evaluation,  At surgeon's request and post-op pain management  Laterality: Left  Prep: chloraprep       Needles:  Injection technique: Single-shot  Needle Type: Echogenic Stimulator Needle     Needle Length: 5cm  Needle Gauge: 22     Additional Needles:   Procedures:, nerve stimulator,,, ultrasound used (permanent image in chart),,,,  Narrative:  Injection made incrementally with aspirations every 5 mL.  Performed by: Personally  Anesthesiologist: Janeece Riggers, MD  Additional Notes: Functioning IV was confirmed and monitors were applied.  A 77mm 22ga Arrow echogenic stimulator needle was used. Sterile prep and drape,hand hygiene and sterile gloves were used. Ultrasound guidance: relevant anatomy identified, needle position confirmed, local anesthetic spread visualized around nerve(s)., vascular puncture avoided.  Image printed for medical record. Negative aspiration and negative test dose prior to incremental administration of local anesthetic. The patient tolerated the procedure well.

## 2018-12-27 NOTE — Anesthesia Postprocedure Evaluation (Signed)
Anesthesia Post Note  Patient: [name]  Procedure(s) Performed: LEFT TOTAL KNEE ARTHROPLASTY (Left Knee)     Patient location during evaluation: PACU Anesthesia Type: Spinal Level of consciousness: oriented and awake and alert Pain management: pain level controlled Vital Signs Assessment: post-procedure vital signs reviewed and stable Respiratory status: spontaneous breathing, respiratory function stable and patient connected to nasal cannula oxygen Cardiovascular status: blood pressure returned to baseline and stable Postop Assessment: no headache, no backache and no apparent nausea or vomiting Anesthetic complications: no    Last Vitals:  Vitals:   12/27/18 1006  BP: (!) 158/91  Pulse: 74  Resp: 16  Temp: 37.2 C  SpO2: 98%    Last Pain:  Vitals:   12/27/18 1006  TempSrc: Oral  PainSc: 4                  Payden Bonus

## 2018-12-28 ENCOUNTER — Encounter (HOSPITAL_COMMUNITY): Payer: Self-pay | Admitting: Orthopaedic Surgery

## 2018-12-28 LAB — BASIC METABOLIC PANEL
Anion gap: 9 (ref 5–15)
BUN: 11 mg/dL (ref 6–20)
CO2: 27 mmol/L (ref 22–32)
Calcium: 8.4 mg/dL — ABNORMAL LOW (ref 8.9–10.3)
Chloride: 100 mmol/L (ref 98–111)
Creatinine, Ser: 0.95 mg/dL (ref 0.44–1.00)
Glucose, Bld: 126 mg/dL — ABNORMAL HIGH (ref 70–99)
Potassium: 4 mmol/L (ref 3.5–5.1)
Sodium: 136 mmol/L (ref 135–145)

## 2018-12-28 LAB — CBC
HCT: 30.8 % — ABNORMAL LOW (ref 36.0–46.0)
Hemoglobin: 9.3 g/dL — ABNORMAL LOW (ref 12.0–15.0)
MCH: 24.5 pg — ABNORMAL LOW (ref 26.0–34.0)
MCHC: 30.2 g/dL (ref 30.0–36.0)
MCV: 81.1 fL (ref 80.0–100.0)
Platelets: 270 10*3/uL (ref 150–400)
RBC: 3.8 MIL/uL — ABNORMAL LOW (ref 3.87–5.11)
RDW: 20.5 % — ABNORMAL HIGH (ref 11.5–15.5)
WBC: 10.2 10*3/uL (ref 4.0–10.5)
nRBC: 0 % (ref 0.0–0.2)

## 2018-12-28 MED ORDER — OXYCODONE HCL ER 10 MG PO T12A
20.0000 mg | EXTENDED_RELEASE_TABLET | Freq: Two times a day (BID) | ORAL | Status: DC
  Administered 2018-12-28 – 2018-12-29 (×2): 20 mg via ORAL
  Filled 2018-12-28 (×2): qty 2

## 2018-12-28 MED ORDER — OXYCODONE HCL 5 MG PO TABS: 5.0000 mg | ORAL_TABLET | ORAL | Status: DC | PRN

## 2018-12-28 NOTE — Progress Notes (Signed)
Physical Therapy Treatment Patient Details Name: [name] MRN: [id number] DOB: [birth year] Today's Date: 12/28/2018    History of Present Illness pt is a 48 y/o female with h/o HTN, admitted for elective L TKA    PT Comments    Patient received in chair, pleasant but with high pain levels but willing to participate in PT; RN aware and reports she was premedicated prior to therapy. Able to complete functional transfers with min guard, RW, and intermittent VC for safety; tolerated gait training approximately 156f today with RW and min guard but pain limited, cues provided for heel toe pattern and use of UEs to offload painful surgical LE. Also introduced stair training with RW, min guard, and no rails; spouse educated on correct method of assist and patient able to safely navigate 2 steps with RW/min guard to enter/exit home. She was left in bed with CPM on at 0-75 degrees (ROM decreased due to pain), family present, and all needs otherwise met this morning.    Follow Up Recommendations  Follow surgeon's recommendation for DC plan and follow-up therapies     Equipment Recommendations  Other (comment)(has all DME )    Recommendations for Other Services       Precautions / Restrictions Precautions Precautions: Knee Restrictions Weight Bearing Restrictions: Yes LLE Weight Bearing: Weight bearing as tolerated    Mobility  Bed Mobility Overal bed mobility: Needs Assistance Bed Mobility: Sit to Supine       Sit to supine: Min assist   General bed mobility comments: MinA to manage surgical knee due to pain   Transfers Overall transfer level: Needs assistance Equipment used: Rolling walker (2 wheeled) Transfers: Sit to/from Stand Sit to Stand: Min guard         General transfer comment: min guard, VC for safety   Ambulation/Gait Ambulation/Gait assistance: Min guard Gait Distance (Feet): 120 Feet Assistive device: Rolling walker (2 wheeled) Gait  Pattern/deviations: Step-through pattern;Decreased step length - right;Decreased stance time - left;Decreased stride length;Decreased dorsiflexion - left;Decreased weight shift to left;Trunk flexed;Narrow base of support Gait velocity: decreased    General Gait Details: steady with RW, gait pattern improving with longer step lengths but remains limited due to pain L LE; cues for upright posture and heel-toe pattern    Stairs Stairs: Yes Stairs assistance: Min guard Stair Management: No rails;Backwards;With walker Number of Stairs: 2 General stair comments: min guard for safety, spouse assisted in stabilizing walker with correct technique; able to demonstrate and teach back correct sequencing for stair navigation    Wheelchair Mobility    Modified Rankin (Stroke Patients Only)       Balance Overall balance assessment: Needs assistance Sitting-balance support: No upper extremity supported;Feet supported Sitting balance-Leahy Scale: Good     Standing balance support: Bilateral upper extremity supported;During functional activity Standing balance-Leahy Scale: Fair                              Cognition Arousal/Alertness: Awake/alert Behavior During Therapy: WFL for tasks assessed/performed Overall Cognitive Status: Within Functional Limits for tasks assessed                                        Exercises      General Comments        Pertinent Vitals/Pain Pain Assessment: 0-10 Pain Score: 9  Pain Location:  surgical knee  Pain Descriptors / Indicators: Sharp;Sore;Guarding Pain Intervention(s): Limited activity within patient's tolerance;Monitored during session;Premedicated before session;Utilized relaxation techniques    Home Living                      Prior Function            PT Goals (current goals can now be found in the care plan section) Acute Rehab PT Goals Patient Stated Goal: home independent PT Goal Formulation:  With patient Time For Goal Achievement: 01/03/19 Potential to Achieve Goals: Good Progress towards PT goals: Progressing toward goals    Frequency    7X/week      PT Plan Current plan remains appropriate    Co-evaluation              AM-PAC PT "6 Clicks" Mobility   Outcome Measure  Help needed turning from your back to your side while in a flat bed without using bedrails?: None Help needed moving from lying on your back to sitting on the side of a flat bed without using bedrails?: A Little Help needed moving to and from a bed to a chair (including a wheelchair)?: A Little Help needed standing up from a chair using your arms (e.g., wheelchair or bedside chair)?: A Little Help needed to walk in hospital room?: A Little Help needed climbing 3-5 steps with a railing? : A Little 6 Click Score: 19    End of Session   Activity Tolerance: Patient tolerated treatment well Patient left: in bed;in CPM;with call bell/phone within reach;with family/visitor present Nurse Communication: Mobility status PT Visit Diagnosis: Other abnormalities of gait and mobility (R26.89);Pain Pain - Right/Left: Left Pain - part of body: Knee     Time: 8185-9093 PT Time Calculation (min) (ACUTE ONLY): 31 min  Charges:  $Gait Training: 23-37 mins                     Deniece Ree PT, DPT, CBIS  Supplemental Physical Therapist [place]    Pager [phone] Acute Rehab Office [phone]

## 2018-12-28 NOTE — Care Management Note (Signed)
Case Management Note  Patient Details  Name: [name] MRN: [id number] Date of Birth: 29-Sep-1971  Subjective/Objective:                 knee   Action/Plan:  Spoke w patient at bedside. She states she agreeable to [facility] for [facility] as set up by MD office prior to admission. She states she has RW 3/1 at home, and has no needs for additional DME. No other CM needs identified.  Expected Discharge Date:                  Expected Discharge Plan:  [place]  In-House Referral:     Discharge planning Services  CM Consult  Post Acute Care Choice:  Home Health Choice offered to:  Patient  DME Arranged:    DME Agency:     HH Arranged:  PT [place]:  [facility] (formerly [employer])  Status of Service:  Completed, signed off  If discussed at [employer], dates discussed:    Additional Comments:  Carles Collet, RN 12/28/2018, 12:20 PM

## 2018-12-28 NOTE — Progress Notes (Signed)
Subjective: 1 Day Post-Op Procedure(s) (LRB): LEFT TOTAL KNEE ARTHROPLASTY (Left) Patient reports pain as moderate.  Acute blood loss anemia from surgery, but tolerating well.  Objective: Vital signs in last 24 hours: Temp:  [97.5 F (36.4 C)-98.9 F (37.2 C)] 98.2 F (36.8 C) (01/15 0501) Pulse Rate:  [57-85] 81 (01/15 0501) Resp:  [12-18] 16 (01/15 0501) BP: (114-158)/(74-99) 120/74 (01/15 0501) SpO2:  [94 %-100 %] 99 % (01/15 0501) Weight:  [102.4 kg] 102.4 kg (01/14 1006)  Intake/Output from previous day: 01/14 0701 - 01/15 0700 In: 2140 [P.O.:240; I.V.:1500; IV Piggyback:200] Out: 1425 [XBLTJ:0300; Blood:50] Intake/Output this shift: No intake/output data recorded.  Recent Labs    12/28/18 0127  HGB 9.3*   Recent Labs    12/28/18 0127  WBC 10.2  RBC 3.80*  HCT 30.8*  PLT 270   Recent Labs    12/28/18 0127  NA 136  K 4.0  CL 100  CO2 27  BUN 11  CREATININE 0.95  GLUCOSE 126*  CALCIUM 8.4*   No results for input(s): LABPT, INR in the last 72 hours.  Sensation intact distally Intact pulses distally Dorsiflexion/Plantar flexion intact Incision: dressing C/D/I Compartment soft  Assessment/Plan: 1 Day Post-Op Procedure(s) (LRB): LEFT TOTAL KNEE ARTHROPLASTY (Left) Up with therapy Discharge home with home health next 1-2 days.    Mcarthur Rossetti 12/28/2018, 7:33 AM

## 2018-12-28 NOTE — Progress Notes (Signed)
Physical Therapy Treatment Patient Details Name: [name] MRN: [id number] DOB: 1971/02/02 Today's Date: 12/28/2018    History of Present Illness Pt is a 48 y/o female with h/o HTN, admitted for elective L TKA    PT Comments    Pt presents to PT reporting decreased pain since morning session. Still in notable pain. She was able to perform exercises, knee flexion with active assist, but repetitions were limited due to high pain. She required minA for management of LLE during transfer from supine to sit. Pt able to ambulate in hallway using RW with min guard. Pt using step-to gait pattern secondary to pain. She is still requiring min guard during sit to/from stand transfer for safety and verbal cues for hand placement. Pt left in chair with call bell, family in room and needs met. ROM: lacking 10 degrees extension; 56 degrees flexion - pt limited in mobility due to pain.    Follow Up Recommendations  Follow surgeon's recommendation for DC plan and follow-up therapies     Equipment Recommendations  Other (comment)(has all DME at home)    Recommendations for Other Services       Precautions / Restrictions Precautions Precautions: Knee Restrictions Weight Bearing Restrictions: Yes LLE Weight Bearing: Weight bearing as tolerated    Mobility  Bed Mobility Overal bed mobility: Needs Assistance Bed Mobility: Rolling;Supine to Sit Rolling: Modified independent (Device/Increase time)   Supine to sit: Min assist(assist with managing LLE due to pain)        Transfers Overall transfer level: Needs assistance Equipment used: Rolling walker (2 wheeled) Transfers: Sit to/from Stand Sit to Stand: Min guard         General transfer comment: min guard, verbal cuing for hand placement and management of LLE  Ambulation/Gait Ambulation/Gait assistance: Min guard Gait Distance (Feet): 140 Feet Assistive device: Rolling walker (2 wheeled) Gait Pattern/deviations: Step-to  pattern;Decreased stance time - left;Antalgic Gait velocity: decreased    General Gait Details: Pt able to safely ambulate with RW, step-to pattern secondary to pain   Stairs             Wheelchair Mobility    Modified Rankin (Stroke Patients Only)       Balance   Sitting-balance support: No upper extremity supported;Feet supported Sitting balance-Leahy Scale: Good     Standing balance support: Bilateral upper extremity supported;During functional activity Standing balance-Leahy Scale: Fair                              Cognition Arousal/Alertness: Awake/alert Behavior During Therapy: WFL for tasks assessed/performed Overall Cognitive Status: Within Functional Limits for tasks assessed                                        Exercises Total Joint Exercises Ankle Circles/Pumps: AROM;10 reps Quad Sets: AROM;10 reps Towel Squeeze: AROM;5 reps Short Arc Quad: AROM;5 reps Heel Slides: AAROM;5 reps Hip ABduction/ADduction: AROM;5 reps Goniometric ROM: lacking 10 deg extension; 56 deg flexion(Pt limited due to pain)    General Comments        Pertinent Vitals/Pain Pain Assessment: Faces Pain Score: 8  Faces Pain Scale: Hurts even more Pain Location: L knee Pain Descriptors / Indicators: Aching;Sore;Sharp Pain Intervention(s): Limited activity within patient's tolerance    Home Living Family/patient expects to be discharged to:: Private residence Living Arrangements: Spouse/significant  other Available Help at Discharge: Family;Available 24 hours/day;Available PRN/intermittently Type of Home: House Home Access: Stairs to enter Entrance Stairs-Rails: None Home Layout: One level;Able to live on main level with bedroom/bathroom Home Equipment: Gilford Rile - 2 wheels;Bedside commode;Shower seat      Prior Function Level of Independence: Independent          PT Goals (current goals can now be found in the care plan section) Acute  Rehab PT Goals Patient Stated Goal: go home PT Goal Formulation: With patient Time For Goal Achievement: 01/11/19 Potential to Achieve Goals: Good Progress towards PT goals: Progressing toward goals    Frequency    7X/week      PT Plan Current plan remains appropriate    Co-evaluation              AM-PAC PT "6 Clicks" Mobility   Outcome Measure  Help needed turning from your back to your side while in a flat bed without using bedrails?: None Help needed moving from lying on your back to sitting on the side of a flat bed without using bedrails?: A Little Help needed moving to and from a bed to a chair (including a wheelchair)?: A Little Help needed standing up from a chair using your arms (e.g., wheelchair or bedside chair)?: A Little Help needed to walk in hospital room?: A Little Help needed climbing 3-5 steps with a railing? : A Little 6 Click Score: 19    End of Session   Activity Tolerance: Patient tolerated treatment well Patient left: in chair;with call bell/phone within reach;with family/visitor present   PT Visit Diagnosis: Other abnormalities of gait and mobility (R26.89) Pain - Right/Left: Left Pain - part of body: Knee     Ronnell Gu[place]aj[place], SPT

## 2018-12-28 NOTE — Plan of Care (Signed)
  Problem: Education: Goal: Knowledge of General Education information will improve Description Including pain rating scale, medication(s)/side effects and non-pharmacologic comfort measures Outcome: Progressing   Problem: Pain Managment: Goal: General experience of comfort will improve Outcome: Progressing   Problem: Safety: Goal: Ability to remain free from injury will improve Outcome: Progressing   Problem: Education: Goal: Knowledge of the prescribed therapeutic regimen will improve Outcome: Progressing Goal: Individualized Educational Video(s) Outcome: Progressing   Problem: Skin Integrity: Goal: Risk for impaired skin integrity will decrease Outcome: Progressing

## 2018-12-28 NOTE — Evaluation (Signed)
Occupational Therapy Evaluation Patient Details Name: [name] MRN: [id number] DOB: 26-Feb-1971 Today's Date: 12/28/2018    History of Present Illness pt is a 48 y/o female with h/o HTN, admitted for elective L TKA   Clinical Impression   PATIENT WAS SEEN FOR SKILLED OT TO MAXIMIZE I AND SAFETY WITH ADLS. PATIENT WAS EDUCATED ON USE OF AE FOR LE ADLS. PATIENT WILL NEED FURTHER OT TO REINFORCE LEARNING AND FOR DME EDUCATION. PATIENT IS LIMITED BY PAIN TODAY.     Follow Up Recommendations  Home health OT    Equipment Recommendations  None recommended by OT    Recommendations for Other Services       Precautions / Restrictions Precautions Precautions: Knee Restrictions Weight Bearing Restrictions: Yes LLE Weight Bearing: Weight bearing as tolerated      Mobility Bed Mobility Overal bed mobility: Needs Assistance Bed Mobility: Sit to Supine       Sit to supine: Min assist   General bed mobility comments: MinA to manage surgical knee due to pain   Transfers Overall transfer level: Needs assistance Equipment used: Rolling walker (2 wheeled) Transfers: Sit to/from Stand Sit to Stand: Min guard         General transfer comment: min guard, VC for safety     Balance Overall balance assessment: Needs assistance Sitting-balance support: No upper extremity supported;Feet supported Sitting balance-Leahy Scale: Good     Standing balance support: Bilateral upper extremity supported;During functional activity Standing balance-Leahy Scale: Fair                             ADL either performed or assessed with clinical judgement   ADL Overall ADL's : Needs assistance/impaired Eating/Feeding: Independent   Grooming: Wash/dry hands;Wash/dry face;Set up   Upper Body Bathing: Set up;Sitting   Lower Body Bathing: Minimal assistance;With adaptive equipment   Upper Body Dressing : Supervision/safety;Set up;Sitting   Lower Body Dressing: Moderate  assistance;With adaptive equipment                 General ADL Comments: patient was ed on use of ae for le adls.      Vision Baseline Vision/History: Wears glasses Wears Glasses: Reading only Patient Visual Report: No change from baseline       Perception     Praxis      Pertinent Vitals/Pain Pain Assessment: 0-10 Pain Score: 8  Pain Location: sx knee Pain Descriptors / Indicators: Sharp;Aching Pain Intervention(s): Premedicated before session     Hand Dominance Right   Extremity/Trunk Assessment Upper Extremity Assessment Upper Extremity Assessment: Overall WFL for tasks assessed           Communication Communication Communication: No difficulties   Cognition Arousal/Alertness: Awake/alert Behavior During Therapy: WFL for tasks assessed/performed Overall Cognitive Status: Within Functional Limits for tasks assessed                                     General Comments       Exercises     Shoulder Instructions      Home Living Family/patient expects to be discharged to:: Private residence Living Arrangements: Spouse/significant other Available Help at Discharge: Family;Available 24 hours/day;Available PRN/intermittently Type of Home: House Home Access: Stairs to enter [employer] of Steps: 2 Entrance Stairs-Rails: None Home Layout: One level;Able to live on main level with bedroom/bathroom  Bathroom Shower/Tub: Occupational psychologist: Handicapped height Bathroom Accessibility: Yes   Home Equipment: Environmental consultant - 2 wheels;Bedside commode;Shower seat          Prior Functioning/Environment Level of Independence: Independent                 OT Problem List: Decreased knowledge of use of DME or AE      OT Treatment/Interventions: Self-care/ADL training;DME and/or AE instruction;Visual/perceptual remediation/compensation    OT Goals(Current goals can be found in the care plan section) Acute Rehab  OT Goals Patient Stated Goal: go home OT Goal Formulation: With patient Time For Goal Achievement: 01/11/19 Potential to Achieve Goals: Good ADL Goals Pt Will Perform Lower Body Bathing: with mod assist Pt Will Perform Lower Body Dressing: with modified independence Pt Will Transfer to Toilet: with modified independence Pt Will Perform Toileting - Clothing Manipulation and hygiene: with modified independence Pt Will Perform Tub/Shower Transfer: with supervision  OT Frequency: Min 2X/week   Barriers to D/C:            Co-evaluation              AM-PAC OT "6 Clicks" Daily Activity     Outcome Measure Help from another person eating meals?: None Help from another person taking care of personal grooming?: A Little Help from another person toileting, which includes using toliet, bedpan, or urinal?: A Little Help from another person bathing (including washing, rinsing, drying)?: A Little Help from another person to put on and taking off regular upper body clothing?: A Little Help from another person to put on and taking off regular lower body clothing?: A Lot 6 Click Score: 18   End of Session CPM Left Knee CPM Left Knee: On Left Knee Flexion (Degrees): 75 Left Knee Extension (Degrees): 0 Additional Comments: reduced CPM ROM due to pain, unable to tolerate 90 degrees today  Nurse Communication: (ok therapy)  Activity Tolerance: Patient limited by pain Patient left: in bed;with call bell/phone within reach;with bed alarm set  OT Visit Diagnosis: Unsteadiness on feet (R26.81)                Time: 0017-4944 OT Time Calculation (min): 29 min Charges:  OT General Charges $OT Visit: 1 Visit OT Evaluation $OT Eval Low Complexity: 1 Low  6 CLICKS  Donato Studley 12/28/2018, 12:29 PM

## 2018-12-29 LAB — CBC
HCT: 30.7 % — ABNORMAL LOW (ref 36.0–46.0)
HEMOGLOBIN: 9.6 g/dL — AB (ref 12.0–15.0)
MCH: 25.3 pg — ABNORMAL LOW (ref 26.0–34.0)
MCHC: 31.3 g/dL (ref 30.0–36.0)
MCV: 80.8 fL (ref 80.0–100.0)
Platelets: 306 10*3/uL (ref 150–400)
RBC: 3.8 MIL/uL — ABNORMAL LOW (ref 3.87–5.11)
RDW: 20 % — ABNORMAL HIGH (ref 11.5–15.5)
WBC: 10.4 10*3/uL (ref 4.0–10.5)
nRBC: 0 % (ref 0.0–0.2)

## 2018-12-29 MED ORDER — OXYCODONE HCL 5 MG PO TABS: 5.0000 mg | ORAL_TABLET | ORAL | 0 refills | Status: DC | PRN

## 2018-12-29 MED ORDER — METHOCARBAMOL 500 MG PO TABS: 500.0000 mg | ORAL_TABLET | Freq: Four times a day (QID) | ORAL | 0 refills | Status: DC | PRN

## 2018-12-29 MED ORDER — ASPIRIN 325 MG PO TBEC: 325.0000 mg | DELAYED_RELEASE_TABLET | Freq: Two times a day (BID) | ORAL | 0 refills | Status: DC

## 2018-12-29 MED FILL — ASPIRIN EC 325 MG TABLET: 325 | 15 days supply | Qty: 30 | Fill #0

## 2018-12-29 MED FILL — METHOCARBAMOL 500 MG TABS: 500 | 10 days supply | Qty: 40 | Fill #0

## 2018-12-29 MED FILL — oxyCODONE HCL 5 MG TABS: 5 | 3 days supply | Qty: 40 | Fill #0

## 2018-12-29 NOTE — Discharge Instructions (Signed)

## 2018-12-29 NOTE — Care Management (Signed)
Notified by [facility] that patient is OON. Patient agreeable to [facility] and referral accepted by [facility]. He spoke to her about co-pays. No additional CM needs.

## 2018-12-29 NOTE — Discharge Summary (Signed)
Patient ID: [name] MRN: [id number] DOB/AGE: Aug 26, 1971 48 y.o.  Admit date: 12/27/2018 Discharge date: 12/29/2018  Admission Diagnoses:  Principal Problem:   Unilateral primary osteoarthritis, left knee Active Problems:   Status post total left knee replacement   Discharge Diagnoses:  Same  Past Medical History:  Diagnosis Date  . Abnormal uterine bleeding (AUB) 10/11/2015  . ADD (attention deficit disorder)   . Anxiety   . Arthritis   . Chronic headache   . Depression   . Depression with anxiety 12/30/2011  . Edema   . GERD (gastroesophageal reflux disease)   . Heart murmur   . Heavy periods   . HLD (hyperlipidemia) 10/21/2016  . Hypertension   . Hypothyroidism   . Iron deficiency anemia   . Joint pain   . Pneumonia    patient doesnt remember this  . Thyroid nodule    ultrasound 10/13  . Vitamin D deficiency     Surgeries: Procedure(s): LEFT TOTAL KNEE ARTHROPLASTY on 12/27/2018   Consultants:   Discharged Condition: Improved  Hospital Course: MADEEHA COSTANTINO is an 48 y.o. female who was admitted 12/27/2018 for operative treatment ofUnilateral primary osteoarthritis, left knee. Patient has severe unremitting pain that affects sleep, daily activities, and work/hobbies. After pre-op clearance the patient was taken to the operating room on 12/27/2018 and underwent  Procedure(s): LEFT TOTAL KNEE ARTHROPLASTY.    Patient was given perioperative antibiotics:  Anti-infectives (From admission, onward)   Start     Dose/Rate Route Frequency Ordered Stop   12/27/18 1830  ceFAZolin (ANCEF) IVPB 1 g/50 mL premix     1 g 100 mL/hr over 30 Minutes Intravenous Every 6 hours 12/27/18 1703 12/27/18 2345   12/27/18 0900  ceFAZolin (ANCEF) IVPB 2g/100 mL premix     2 g 200 mL/hr over 30 Minutes Intravenous On call to O.R. 12/27/18 0856 12/27/18 1207       Patient was given sequential compression devices, early ambulation, and chemoprophylaxis to prevent DVT.  Patient  benefited maximally from hospital stay and there were no complications.    Recent vital signs:  Patient Vitals for the past 24 hrs:  BP Temp Temp src Pulse Resp SpO2  12/29/18 0931 126/78 98.7 F (37.1 C) Oral 86 16 98 %  12/29/18 0431 120/68 98.2 F (36.8 C) Oral 91 16 99 %  12/28/18 2037 (!) 142/92 98.5 F (36.9 C) Oral 94 14 98 %  12/28/18 1700 121/72 98.3 F (36.8 C) Oral 91 18 99 %     Recent laboratory studies:  Recent Labs    12/28/18 0127 12/29/18 0141  WBC 10.2 10.4  HGB 9.3* 9.6*  HCT 30.8* 30.7*  PLT 270 306  NA 136  --   K 4.0  --   CL 100  --   CO2 27  --   BUN 11  --   CREATININE 0.95  --   GLUCOSE 126*  --   CALCIUM 8.4*  --      Discharge Medications:   Allergies as of 12/29/2018   No Known Allergies     Medication List    STOP taking these medications   cyclobenzaprine 5 MG tablet Commonly known as:  FLEXERIL   ibuprofen 200 MG tablet Commonly known as:  ADVIL,MOTRIN     TAKE these medications   aspirin 325 MG EC tablet Take 1 tablet (325 mg total) by mouth 2 (two) times daily after a meal.   ferrous sulfate 325 (65 FE) MG  tablet Take 325 mg by mouth every evening.   levothyroxine 200 MCG tablet Commonly known as:  SYNTHROID, LEVOTHROID Take 200 mcg by mouth daily before breakfast.   lisinopril-hydrochlorothiazide 10-12.5 MG tablet Commonly known as:  PRINZIDE,ZESTORETIC Take 1 tablet by mouth daily.   methocarbamol 500 MG tablet Commonly known as:  ROBAXIN Take 1 tablet (500 mg total) by mouth every 6 (six) hours as needed for muscle spasms.   oxyCODONE 5 MG immediate release tablet Commonly known as:  Oxy IR/ROXICODONE Take 1-2 tablets (5-10 mg total) by mouth every 4 (four) hours as needed for moderate pain (pain score 4-6).            Durable Medical Equipment  (From admission, onward)         Start     Ordered   12/27/18 1704  DME 3 n 1  Once     12/27/18 1703   12/27/18 1704  DME Walker rolling  Once     Question:  Patient needs a walker to treat with the following condition  Answer:  Status post total left knee replacement   12/27/18 1703          Diagnostic Studies: Dg Knee Left Port  Result Date: 12/27/2018 CLINICAL DATA:  Status post left total knee replacement. EXAM: PORTABLE LEFT KNEE - 1-2 VIEW COMPARISON:  None. FINDINGS: The femoral and tibial and patellar components appear to be well situated. No fracture or dislocation is noted. Expected postoperative changes are seen in the soft tissues anteriorly. IMPRESSION: Status post left total knee arthroplasty. Electronically Signed   By: Marijo Conception, M.D.   On: 12/27/2018 14:25    Disposition: Discharge disposition: 01-Home or [facility] Follow up.   Specialty:  [facility] Contact information: [place] [address] [phone]        Mcarthur Rossetti, MD. Schedule an appointment as soon as possible for a visit in 2 week(s).   Specialty:  Orthopedic Surgery Contact information: [place] [address] [phone]            Signed: Mcarthur Rossetti 12/29/2018, 9:57 AM

## 2018-12-29 NOTE — Progress Notes (Signed)
Physical Therapy Treatment Patient Details Name: [name] MRN: [id number] DOB: Aug 07, 1971 Today's Date: 12/29/2018    History of Present Illness Pt is a 48 y/o female with h/o HTN, admitted for elective L TKA    PT Comments    Pt presents to PT in bed in good spirits, stating she is ready to go home and willing to participate in therapy. She was able to transfer supine to sit without assistance (mod(I)), using her RLE to assist the movement of her surgical limb. She went from sit to stand with supervision for safety, but no longer required cues for hand placement. She ambulated approximately 150 ft in the hallway, still demonstrating decreased stance time on surgical limb due to pain but gait pattern continues to improve. Pt returned to bed mod(I). Pt reports that she practiced getting into shower with OT. She states she feels confident in her mobility status and ready to go home safely. From a PT standpoint, pt is safe to DC home with caregiver at this time.    Follow Up Recommendations  Follow surgeon's recommendation for DC plan and follow-up therapies     Equipment Recommendations  Other (comment)(has DME at home)    Recommendations for Other Services       Precautions / Restrictions Precautions Precautions: Knee Restrictions Weight Bearing Restrictions: Yes LLE Weight Bearing: Weight bearing as tolerated    Mobility  Bed Mobility Overal bed mobility: Modified Independent Bed Mobility: Supine to Sit;Sit to Supine     Supine to sit: Modified independent (Device/Increase time) Sit to supine: Modified independent (Device/Increase time)   General bed mobility comments: Pt is managing surgical knee without assistance now, no verbal cuing required  Transfers Overall transfer level: Needs assistance Equipment used: Rolling walker (2 wheeled) Transfers: Sit to/from Stand Sit to Stand: Supervision         General transfer comment: supervision for  safety  Ambulation/Gait Ambulation/Gait assistance: Supervision Gait Distance (Feet): 150 Feet Assistive device: Rolling walker (2 wheeled) Gait Pattern/deviations: Step-through pattern;Decreased step length - right;Decreased stance time - left;Antalgic Gait velocity: decreased    General Gait Details: pt able to ambulate safely with RW and verbal cues for step through gait pattern, cues for forward gaze for safety   Stairs             Wheelchair Mobility    Modified Rankin (Stroke Patients Only)       Balance Overall balance assessment: Needs assistance Sitting-balance support: No upper extremity supported;Feet supported Sitting balance-Leahy Scale: Good     Standing balance support: Bilateral upper extremity supported;During functional activity Standing balance-Leahy Scale: Good Standing balance comment: Pt using RW for bilateral UE support during ambulation                            Cognition Arousal/Alertness: Awake/alert Behavior During Therapy: WFL for tasks assessed/performed Overall Cognitive Status: Within Functional Limits for tasks assessed                                        Exercises Total Joint Exercises Ankle Circles/Pumps: AROM;10 reps Quad Sets: AROM;10 reps Towel Squeeze: AROM;5 reps Short Arc Quad: AROM;5 reps Heel Slides: AAROM;5 reps Hip ABduction/ADduction: AROM;5 reps Goniometric ROM: lacking 7 deg extension; 66 deg flexion    General Comments        Pertinent Vitals/Pain Pain  Assessment: 0-10 Pain Score: 6  Pain Location: L knee Pain Descriptors / Indicators: Aching;Sore Pain Intervention(s): Monitored during session    Home Living                      Prior Function            PT Goals (current goals can now be found in the care plan section) Acute Rehab PT Goals Patient Stated Goal: go home PT Goal Formulation: With patient/family Time For Goal Achievement: 01/11/19 Potential  to Achieve Goals: Good Progress towards PT goals: Progressing toward goals    Frequency    7X/week      PT Plan Current plan remains appropriate    Co-evaluation              AM-PAC PT "6 Clicks" Mobility   Outcome Measure  Help needed turning from your back to your side while in a flat bed without using bedrails?: None Help needed moving from lying on your back to sitting on the side of a flat bed without using bedrails?: None Help needed moving to and from a bed to a chair (including a wheelchair)?: A Little Help needed standing up from a chair using your arms (e.g., wheelchair or bedside chair)?: A Little Help needed to walk in hospital room?: A Little Help needed climbing 3-5 steps with a railing? : A Little 6 Click Score: 20    End of Session   Activity Tolerance: Patient tolerated treatment well Patient left: in bed;with call bell/phone within reach   PT Visit Diagnosis: Other abnormalities of gait and mobility (R26.89) Pain - Right/Left: Left Pain - part of body: Knee     Time: 7371-0626 PT Time Calculation (min) (ACUTE ONLY): 14 min    Ronnell Guadalajara, SPT

## 2018-12-29 NOTE — Plan of Care (Signed)
  Problem: Activity: Goal: Risk for activity intolerance will decrease Outcome: Progressing   Problem: Elimination: Goal: Will not experience complications related to bowel motility Outcome: Progressing   Problem: Nutrition: Goal: Adequate nutrition will be maintained Outcome: Progressing   Problem: Pain Managment: Goal: General experience of comfort will improve Outcome: Progressing   Problem: Safety: Goal: Ability to remain free from injury will improve Outcome: Progressing

## 2018-12-29 NOTE — Progress Notes (Addendum)
I was present and agree with all interventions/POC performed this session.    12/29/18 0931  PT Time Calculation  PT Start Time (ACUTE ONLY) 0848  PT Stop Time (ACUTE ONLY) 0917  PT Time Calculation (min) (ACUTE ONLY) 29 min  PT General Charges  $$ ACUTE PT VISIT 1 Visit  PT Treatments  $Gait Training 8-22 mins  $Therapeutic Exercise 8-22 mins    Deniece Ree PT, DPT, CBIS  Supplemental Physical Therapist [facility]    Pager [phone] Acute Rehab Office [phone]      Physical Therapy Treatment Patient Details Name: [name] MRN: [id number] DOB: [birth year] Today's Date: 12/29/2018    History of Present Illness Pt is a 48 y/o female with h/o HTN, admitted for elective L TKA    PT Comments    Pt presents to PT reporting feeling better than yesterday, and willing to participate in therapy. She reports she will discharge this afternoon. Pt able to complete exercises. ROM: lacking 7 deg extension, 66 deg flexion. She was able to perform bed mobility, still requiring minA to manage her LLE due to pain when going from supine to sitting. Pt able to navigate bathroom safely with supervision, including washing her hands while standing with no UE support with supervision. Pt ambulated 160 ft in hallway, required rest breaks to cool down and cuing for step through gait pattern as well as heel-toe pattern. Pt able to safely navigate stairs backwards using RW and no rails with min guard for safety. Pt reports she has to step over a small step to enter her shower- plan to practice before DC this afternoon. Left pt in chair with needs met and call bell.   Follow Up Recommendations  Follow surgeon's recommendation for DC plan and follow-up therapies     Equipment Recommendations  Other (comment)(has all DME at home)    Recommendations for Other Services       Precautions / Restrictions Precautions Precautions: Knee Restrictions Weight Bearing Restrictions:  Yes LLE Weight Bearing: Weight bearing as tolerated    Mobility  Bed Mobility Overal bed mobility: Needs Assistance Bed Mobility: Supine to Sit     Supine to sit: MinA(minA for management of LLE)        Transfers Overall transfer level: Needs assistance Equipment used: Rolling walker (2 wheeled) Transfers: Sit to/from Stand Sit to Stand: Min guard         General transfer comment: min guard for safety d/t pain levels  Ambulation/Gait Ambulation/Gait assistance: Supervision Gait Distance (Feet): 160 Feet Assistive device: Rolling walker (2 wheeled) Gait Pattern/deviations: Step-through pattern;Decreased step length - right;Decreased stance time - left;Antalgic Gait velocity: decreased    General Gait Details: pt able to ambulate safely with RW and verbal cues for step through gait pattern, watching direction of ambulation for safety   Stairs Stairs: Yes Stairs assistance: Min guard Stair Management: No rails;Backwards;With walker Number of Stairs: 2 General stair comments: min guard for safety, verbal cues for placement of RW; pt able to navigate stairs safely   Wheelchair Mobility    Modified Rankin (Stroke Patients Only)       Balance Overall balance assessment: Needs assistance Sitting-balance support: No upper extremity supported;Feet supported Sitting balance-Leahy Scale: Good     Standing balance support: Bilateral upper extremity supported;During functional activity Standing balance-Leahy Scale: Good Standing balance comment: Pt able to demonstrate standing balance without UE support while washing hands with supervision; using RW during ambulation  Cognition Arousal/Alertness: Awake/alert Behavior During Therapy: WFL for tasks assessed/performed Overall Cognitive Status: Within Functional Limits for tasks assessed                                        Exercises Total Joint Exercises Ankle  Circles/Pumps: AROM;10 reps Quad Sets: AROM;10 reps Towel Squeeze: AROM;5 reps Short Arc Quad: AROM;5 reps Heel Slides: AAROM;5 reps Hip ABduction/ADduction: AROM;5 reps Goniometric ROM: lacking 7 deg extension; 66 deg flexion    General Comments        Pertinent Vitals/Pain Pain Assessment: 0-10 Pain Score: 6  Pain Location: L knee Pain Descriptors / Indicators: Aching;Sore Pain Intervention(s): Limited activity within patient's tolerance;Monitored during session    Home Living                      Prior Function            PT Goals (current goals can now be found in the care plan section) Acute Rehab PT Goals Patient Stated Goal: go home PT Goal Formulation: With patient Time For Goal Achievement: 01/11/19 Potential to Achieve Goals: Good Progress towards PT goals: Progressing toward goals    Frequency    7X/week      PT Plan Current plan remains appropriate    Co-evaluation              AM-PAC PT "6 Clicks" Mobility   Outcome Measure  Help needed turning from your back to your side while in a flat bed without using bedrails?: None Help needed moving from lying on your back to sitting on the side of a flat bed without using bedrails?: A Little Help needed moving to and from a bed to a chair (including a wheelchair)?: A Little Help needed standing up from a chair using your arms (e.g., wheelchair or bedside chair)?: A Little Help needed to walk in hospital room?: A Little Help needed climbing 3-5 steps with a railing? : A Little 6 Click Score: 19    End of Session   Activity Tolerance: Patient tolerated treatment well Patient left: in chair;with call bell/phone within reach   PT Visit Diagnosis: Other abnormalities of gait and mobility (R26.89) Pain - Right/Left: Left Pain - part of body: Knee     Time: 3299-2426 PT Time Calculation (min) (ACUTE ONLY): 29 min  Charges:                        Ronnell Guadalajara, SPT

## 2018-12-29 NOTE — Progress Notes (Signed)
Patient ID: [name], female   DOB: 09-Jan-1971, 48 y.o.   MRN: [id number] Feels much better overall.  Tolerating pain.  Vitals stable and left knee stable.  Can be discharged to home today.

## 2018-12-29 NOTE — Progress Notes (Signed)
Provided discharge education/instructions, all questions and concerns addressed, Pt not in distress, discharged home with belongings accompanied by husband. 

## 2018-12-29 NOTE — Progress Notes (Signed)
Occupational Therapy Treatment Patient Details Name: [name] MRN: [id number] DOB: 1971-06-08 Today's Date: 12/29/2018    History of present illness Pt is a 48 y/o female with h/o HTN, admitted for elective L TKA   OT comments  Pt progressing towards OT goals this session, able to complete toilet transfer, shower transfer, sink level grooming all at supervision level. Pt with no questions or concerns at the end of session - excited about going home. Changed dc recommendation to no OT follow up. Education complete from OT perspective.   Follow Up Recommendations  No OT follow up;Supervision/Assistance - 24 hour    Equipment Recommendations  None recommended by OT    Recommendations for Other Services      Precautions / Restrictions Precautions Precautions: Knee;Fall Restrictions Weight Bearing Restrictions: Yes LLE Weight Bearing: Weight bearing as tolerated       Mobility Bed Mobility Overal bed mobility: Modified Independent Bed Mobility: Supine to Sit;Sit to Supine     Supine to sit: Modified independent (Device/Increase time) Sit to supine: Modified independent (Device/Increase time)   General bed mobility comments: Pt is managing surgical knee without assistance now, no verbal cuing required  Transfers Overall transfer level: Needs assistance Equipment used: Rolling walker (2 wheeled) Transfers: Sit to/from Stand Sit to Stand: Supervision         General transfer comment: supervision for safety    Balance Overall balance assessment: Needs assistance Sitting-balance support: No upper extremity supported;Feet supported Sitting balance-Leahy Scale: Good     Standing balance support: Bilateral upper extremity supported;No upper extremity supported;During functional activity Standing balance-Leahy Scale: Good Standing balance comment: able to perform grooming at sink without BUE support and no LOB                           ADL either  performed or assessed with clinical judgement   ADL Overall ADL's : Needs assistance/impaired                         Toilet Transfer: Supervision/safety;Ambulation;RW;Comfort height toilet Toilet Transfer Details (indicate cue type and reason): use of grab bars Toileting- Clothing Manipulation and Hygiene: Modified independent   Tub/ Shower Transfer: Walk-in shower;Anterior/posterior;Rolling walker Clinical cytogeneticist Details (indicate cue type and reason): practiced in room         Vision       Perception     Praxis      Cognition Arousal/Alertness: Awake/alert Behavior During Therapy: WFL for tasks assessed/performed Overall Cognitive Status: Within Functional Limits for tasks assessed                                          Exercises     Shoulder Instructions       General Comments      Pertinent Vitals/ Pain       Pain Assessment: Faces Pain Score: 6  Faces Pain Scale: Hurts little more Pain Location: L knee Pain Descriptors / Indicators: Aching;Sore Pain Intervention(s): Monitored during session;Repositioned  Home Living Family/patient expects to be discharged to:: Private residence                                        Prior Functioning/Environment  Frequency  Min 2X/week        Progress Toward Goals  OT Goals(current goals can now be found in the care plan section)  Progress towards OT goals: Progressing toward goals  Acute Rehab OT Goals Patient Stated Goal: walking better before first grandchild arrives OT Goal Formulation: With patient Potential to Achieve Goals: Good  Plan Discharge plan remains appropriate;Frequency remains appropriate    Co-evaluation                 AM-PAC OT "6 Clicks" Daily Activity     Outcome Measure   Help from another person eating meals?: None Help from another person taking care of personal grooming?: None Help from another person  toileting, which includes using toliet, bedpan, or urinal?: None Help from another person bathing (including washing, rinsing, drying)?: A Little Help from another person to put on and taking off regular upper body clothing?: None Help from another person to put on and taking off regular lower body clothing?: A Little 6 Click Score: 22    End of Session Equipment Utilized During Treatment: Gait belt;Rolling walker  OT Visit Diagnosis: Unsteadiness on feet (R26.81)   Activity Tolerance Patient tolerated treatment well   Patient Left in bed;with call bell/phone within reach;with family/visitor present;with nursing/sitter in room   Nurse Communication Mobility status        Time: 5329-9242 OT Time Calculation (min): 17 min  Charges: OT General Charges $OT Visit: 1 Visit OT Treatments $Self Care/Home Management : 8-22 mins  Hulda Humphrey OTR/L Acute Rehabilitation Services Pager: [phone] Office: [place] 12/29/2018, 2:07 PM

## 2018-12-30 ENCOUNTER — Other Ambulatory Visit: Payer: Self-pay | Admitting: *Deleted

## 2018-12-30 ENCOUNTER — Telehealth (INDEPENDENT_AMBULATORY_CARE_PROVIDER_SITE_OTHER): Payer: Self-pay | Admitting: Orthopaedic Surgery

## 2018-12-30 NOTE — Patient Outreach (Signed)
[name]) Care Management  12/30/2018  [name] 20-Feb-1971 [id number]   Transition of care call    Subjective: Initial successful telephone call to patient's preferred number in order to complete transition of care assessment; 2 HIPAA identifiers verified. Explained purpose of call and completed transition of care assessment.  [name] state she is managing her pain with the prescribed medications, denies bowel or bladder issues although she has not had a bowel movement since she was discharged. Discussed ways to treat constipation. She reports the dressing to her knee is dry and intact. She says she is ambulating well with a  rolling walker and that she took a shower last night with her husband's help. She said she premedicated and did well with her first home physical therapy session this morning. She says she was given a home exercise plan by the home health physical therapist. She says she will have home health physical therapy 3 times per week for 2 weeks.     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  Comorbidities include: Hyperlipidemia, left ventricular hypertrophy, mitral valve regurgitation, HTN, attention deficient disorder, hypothyroidism, depression /anxiety She was discharged to home on 12/29/18 with [place] providing home physical therapy and DME of rolling walker and 3 in 1.   Assessment:  See transition of care flowsheet for assessment details.   Plan:  No other ongoing care management needs identified so will close case to [facility] care management services and route successful outreach letter to [facility] clinical pool to be mailed to patient's home address.    Barrington Ellison RN,CCM,CDE [facility] Coordinator Office Phone  [phone] Office Fax [phone]

## 2018-12-30 NOTE — Telephone Encounter (Signed)
That will be fine. 

## 2018-12-30 NOTE — Telephone Encounter (Signed)
Ok for orders? 

## 2018-12-30 NOTE — Telephone Encounter (Signed)
Debbie/[facility]/PT called needing verbal orders  1x week 1 week 3x week for 2 weeks  Please call Debbie to advise

## 2018-12-30 NOTE — Telephone Encounter (Signed)
I left voicemail advising. ?

## 2019-01-03 ENCOUNTER — Other Ambulatory Visit (HOSPITAL_COMMUNITY): Payer: Self-pay | Admitting: Orthopaedic Surgery

## 2019-01-03 MED FILL — oxyCODONE HCL 5 MG TABS: 5 | 3 days supply | Qty: 40 | Fill #0

## 2019-01-03 NOTE — Telephone Encounter (Signed)
Please advise 

## 2019-01-09 ENCOUNTER — Telehealth (INDEPENDENT_AMBULATORY_CARE_PROVIDER_SITE_OTHER): Payer: Self-pay | Admitting: Orthopaedic Surgery

## 2019-01-09 MED ORDER — OXYCODONE HCL 5 MG PO TABS: ORAL_TABLET | ORAL | 0 refills | Status: DC

## 2019-01-09 MED ORDER — METHOCARBAMOL 500 MG PO TABS: 500.0000 mg | ORAL_TABLET | Freq: Four times a day (QID) | ORAL | 0 refills | Status: DC | PRN

## 2019-01-09 NOTE — Telephone Encounter (Signed)
Patient called wanting to get a refill on her Robaxin and her Oxycodone.  She is needing this sent into the [facility] in [place].  She would like a call back when the medication is called in.  CB#[phone]

## 2019-01-09 NOTE — Telephone Encounter (Signed)
I sent them in. 

## 2019-01-09 NOTE — Telephone Encounter (Signed)
Please advise 

## 2019-01-10 ENCOUNTER — Other Ambulatory Visit (INDEPENDENT_AMBULATORY_CARE_PROVIDER_SITE_OTHER): Payer: Self-pay

## 2019-01-10 ENCOUNTER — Ambulatory Visit (INDEPENDENT_AMBULATORY_CARE_PROVIDER_SITE_OTHER): Payer: No Typology Code available for payment source | Admitting: Orthopaedic Surgery

## 2019-01-10 ENCOUNTER — Encounter (INDEPENDENT_AMBULATORY_CARE_PROVIDER_SITE_OTHER): Payer: Self-pay | Admitting: Orthopaedic Surgery

## 2019-01-10 DIAGNOSIS — Z96652 Presence of left artificial knee joint: Secondary | ICD-10-CM

## 2019-01-10 NOTE — Progress Notes (Signed)
HPI: [name] returns today 2 weeks status post left total knee arthroplasty.  She is overall doing well denies any fever chills shortness of breath.  She does have some numbness about the incision.  She is been taking aspirin 325 mg twice daily for DVT prophylaxis.  Physical exam: Right knee full extension flexion near 90 degrees actively.  Calf supple nontender.  Dorsiflexion plantarflexion ankle intact.  Surgical incisions healing well with subcu stitch well approximated incision no signs of dehiscence or infection.  Impression: Status post left total knee arthroplasty 12/27/2018  Plan: She will continue work with physical therapy transition outpatient therapy at [facility].  Remain on aspirin 325 once daily for the next week and then discontinue as she was on no aspirin prior to surgery.  Scar tissue mobilization encouraged.  She is able to get the incision wet in the shower.  She will follow-up with [place] in 1 month sooner if there is any questions or concerns.

## 2019-01-13 ENCOUNTER — Other Ambulatory Visit (HOSPITAL_COMMUNITY): Payer: Self-pay | Admitting: Orthopaedic Surgery

## 2019-01-13 MED FILL — oxyCODONE HCL 5 MG TABS: 5 | 4 days supply | Qty: 40 | Fill #0

## 2019-01-13 NOTE — Telephone Encounter (Signed)
Please advise 

## 2019-01-16 ENCOUNTER — Ambulatory Visit: Payer: No Typology Code available for payment source | Attending: Orthopaedic Surgery | Admitting: Physical Therapy

## 2019-01-16 DIAGNOSIS — R600 Localized edema: Secondary | ICD-10-CM | POA: Diagnosis present

## 2019-01-16 DIAGNOSIS — M25662 Stiffness of left knee, not elsewhere classified: Secondary | ICD-10-CM | POA: Diagnosis present

## 2019-01-16 DIAGNOSIS — M25562 Pain in left knee: Secondary | ICD-10-CM | POA: Diagnosis present

## 2019-01-16 DIAGNOSIS — G8929 Other chronic pain: Secondary | ICD-10-CM | POA: Insufficient documentation

## 2019-01-16 NOTE — Therapy (Signed)
[facility] [place], [place], [address] Phone: [phone]   Fax:  [phone]  Physical Therapy Evaluation  Patient Details  Name: [name] MRN: [id number] Date of Birth: [birth year] Referring Provider (PT): Jean Rosenthal MD.   Encounter Date: 01/16/2019  PT End of Session - 01/16/19 1024    Visit Number  1    Number of Visits  12    Date for PT Re-Evaluation  02/27/19    Authorization Type  FOTO AT LEAST EVERY 5TH VISITS.  PROGRESS NOTE AT 10TH VISIT.    PT Start Time  0900    PT Stop Time  0956    PT Time Calculation (min)  56 min    Activity Tolerance  Patient tolerated treatment well    Behavior During Therapy  WFL for tasks assessed/performed       Past Medical History:  Diagnosis Date  . Abnormal uterine bleeding (AUB) 10/11/2015  . ADD (attention deficit disorder)   . Anxiety   . Arthritis   . Chronic headache   . Depression   . Depression with anxiety 12/30/2011  . Edema   . GERD (gastroesophageal reflux disease)   . Heart murmur   . Heavy periods   . HLD (hyperlipidemia) 10/21/2016  . Hypertension   . Hypothyroidism   . Iron deficiency anemia   . Joint pain   . Pneumonia    patient doesnt remember this  . Thyroid nodule    ultrasound 10/13  . Vitamin D deficiency     Past Surgical History:  Procedure Laterality Date  . JOINT REPLACEMENT    . tooth implant  2016  . TOTAL KNEE ARTHROPLASTY Left 12/27/2018  . TOTAL KNEE ARTHROPLASTY Left 12/27/2018   Procedure: LEFT TOTAL KNEE ARTHROPLASTY;  Surgeon: Mcarthur Rossetti, MD;  Location: [facility];  Service: Orthopedics;  Laterality: Left;  . TUBAL LIGATION  ~2000  . WISDOM TOOTH EXTRACTION      There were no vitals filed for this visit.   Subjective Assessment - 01/16/19 1032    Subjective  The patient underwent a left total knee replacement on 12/27/18.  She is pleased with her progress thus far.  She had home health physical  therapy and is compliant to her HEP.  Her pain-level today is a 6/10.  She reports her pain-level varies.    Pertinent History  Previous left knee injury.    Patient Stated Goals  See above.    Currently in Pain?  Yes    Pain Score  6     Pain Location  Knee    Pain Orientation  Left    Pain Descriptors / Indicators  Aching;Throbbing    Pain Type  Surgical pain    Aggravating Factors   Bending.    Pain Relieving Factors  Rest.         [facility] PT Assessment - 01/16/19 0001      Assessment   Medical Diagnosis  S/p left total knee replacement.    Referring Provider (PT)  Jean Rosenthal MD.    Onset Date/Surgical Date  --   12/27/18 (surgery date).     Precautions   Precautions  --   No ultrasound.     Restrictions   Weight Bearing Restrictions  No      Balance Screen   Has the patient fallen in the past 6 months  No    Has the patient had a decrease in activity level because of  a fear of falling?   No    Is the patient reluctant to leave their home because of a fear of falling?   No      Prior Function   Level of Independence  Independent      Observation/Other Assessments   Observations  TED hose to knee.  Incision appears to be healing well.    Focus on Therapeutic Outcomes (FOTO)   72% limitation.      Observation/Other Assessments-Edema    Edema  Circumferential      Circumferential Edema   Circumferential - Left   LT 4 cms > RT.      ROM / Strength   AROM / PROM / Strength  AROM;Strength      AROM   Overall AROM Comments  Left knee -12 to 84 degrees.      Strength   Overall Strength Comments  Left hip and knee= 4+/5.      Palpation   Palpation comment  Mild tenderness around left anterior knee.      Ambulation/Gait   Gait Comments  Patient walking with a straight cane with her left knee held in flexion.  Decreased stance time over left LE.                Objective measurements completed on examination: See above findings.      [facility]  Adult PT Treatment/Exercise - 01/16/19 0001      Exercises   Exercises  Knee/Hip      Knee/Hip Exercises: Aerobic   Nustep  Level 2 x 10 minutes moving forward x 2 to increase knee flexion.      Modalities   Modalities  Health visitor Stimulation Location  Left knee.    Electrical Stimulation Action  IFC    Electrical Stimulation Parameters  1-10 Hz x 15 minutes.    Electrical Stimulation Goals  Edema;Pain      Vasopneumatic   Number Minutes Vasopneumatic   15 minutes    Vasopnuematic Location   --   Left knee.   Vasopneumatic Pressure  Low                  PT Long Term Goals - 01/16/19 1100      PT LONG TERM GOAL #1   Title  Independent with a HEP.    Time  6    Period  Weeks    Status  New      PT LONG TERM GOAL #2   Title  Full left active knee extension in order to normalize gait.    Time  6    Period  Weeks    Status  New      PT LONG TERM GOAL #3   Title  Active knee flexion to 115-120 degrees+ so the patient can perform functional tasks and do so with pain not > 2-3/10.    Time  6    Period  Weeks    Status  New      PT LONG TERM GOAL #4   Title  Perform a reciprocating stair gait with one railing with pain not > 2-3/10.    Time  6    Period  Weeks    Status  New             Plan - 01/16/19 1053    Clinical Impression Statement  The patient presents to OPPT s/p left total knee replacement  performed on 12/27/18.  She has a loss of range of motion and strength and moderate edema currently.  She is walking with a straight cane with a gait deviation.   Patient will benefit from skilled physical therapy intervention to address deficits and pain.               Clinical Presentation  Stable    Clinical Presentation due to:  Good surgical outcome.    Clinical Decision Making  Low    Rehab Potential  Excellent    PT Frequency  2x / week    PT Duration  6 weeks    PT  Treatment/Interventions  ADLs/Self Care Home Management;Cryotherapy;Occupational psychologist;Therapeutic activities;Therapeutic exercise;Patient/family education;Manual techniques;Vasopneumatic Device;Passive range of motion    PT Next Visit Plan  Nustep with progression to stataionary bike, PROM, strengthening.  Vasopneulatic and electrical stimulation.    Consulted and Agree with Plan of Care  Patient       Patient will benefit from skilled therapeutic intervention in order to improve the following deficits and impairments:  Abnormal gait, Pain, Decreased activity tolerance, Decreased strength, Decreased range of motion, Increased edema  Visit Diagnosis: Stiffness of left knee, not elsewhere classified - Plan: PT plan of care cert/re-cert  Localized edema - Plan: PT plan of care cert/re-cert  Chronic pain of left knee - Plan: PT plan of care cert/re-cert     Problem List Patient Active Problem List   Diagnosis Date Noted  . 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/27/2018  . Unilateral primary osteoarthritis, left knee 11/01/2018  . Primary osteoarthritis of left knee 11/15/2017  . Dense breast tissue on mammogram 10/21/2016  . HLD (hyperlipidemia) 10/21/2016  . LVH (left ventricular hypertrophy) 10/21/2016  . Mitral regurgitation 10/21/2016  . Abnormal uterine bleeding (AUB) 10/11/2015  . Hypertension 10/01/2015  . Menorrhagia with regular cycle 06/26/2014  . ADD (attention deficit disorder) 02/18/2012  . Hypothyroidism 12/30/2011    APPLEGATE, [place] MPT 01/16/2019, 11:03 AM  [facility] [address] [place], [place], [address] Phone: [phone]   Fax:  [phone]  Name: [name] MRN: [id number] Date of Birth: [birth year]

## 2019-01-18 ENCOUNTER — Other Ambulatory Visit (HOSPITAL_COMMUNITY): Payer: Self-pay | Admitting: Orthopaedic Surgery

## 2019-01-18 MED FILL — oxyCODONE HCL 5 MG TABS: 5 | 3 days supply | Qty: 40 | Fill #0

## 2019-01-18 NOTE — Telephone Encounter (Signed)
Please advise 

## 2019-01-19 ENCOUNTER — Encounter: Payer: Self-pay | Admitting: Physical Therapy

## 2019-01-19 ENCOUNTER — Ambulatory Visit: Payer: No Typology Code available for payment source | Admitting: Physical Therapy

## 2019-01-19 DIAGNOSIS — M25662 Stiffness of left knee, not elsewhere classified: Secondary | ICD-10-CM

## 2019-01-19 DIAGNOSIS — G8929 Other chronic pain: Secondary | ICD-10-CM

## 2019-01-19 DIAGNOSIS — R600 Localized edema: Secondary | ICD-10-CM

## 2019-01-19 DIAGNOSIS — M25562 Pain in left knee: Secondary | ICD-10-CM

## 2019-01-19 NOTE — Therapy (Signed)
[facility] [place], [place], [address] Phone: [phone]   Fax:  [phone]  Physical Therapy Treatment  Patient Details  Name: [name] MRN: [id number] Date of Birth: [birth year] Referring Provider (PT): Jean Rosenthal MD.   Encounter Date: 01/19/2019  PT End of Session - 01/19/19 0910    Visit Number  2    Number of Visits  12    Date for PT Re-Evaluation  02/27/19    PT Start Time  0901    PT Stop Time  0956    PT Time Calculation (min)  55 min    Activity Tolerance  Patient tolerated treatment well    Behavior During Therapy  [facility] for tasks assessed/performed       Past Medical History:  Diagnosis Date  . Abnormal uterine bleeding (AUB) 10/11/2015  . ADD (attention deficit disorder)   . Anxiety   . Arthritis   . Chronic headache   . Depression   . Depression with anxiety 12/30/2011  . Edema   . GERD (gastroesophageal reflux disease)   . Heart murmur   . Heavy periods   . HLD (hyperlipidemia) 10/21/2016  . Hypertension   . Hypothyroidism   . Iron deficiency anemia   . Joint pain   . Pneumonia    patient doesnt remember this  . Thyroid nodule    ultrasound 10/13  . Vitamin D deficiency     Past Surgical History:  Procedure Laterality Date  . JOINT REPLACEMENT    . tooth implant  2016  . TOTAL KNEE ARTHROPLASTY Left 12/27/2018  . TOTAL KNEE ARTHROPLASTY Left 12/27/2018   Procedure: LEFT TOTAL KNEE ARTHROPLASTY;  Surgeon: Mcarthur Rossetti, MD;  Location: [facility];  Service: Orthopedics;  Laterality: Left;  . TUBAL LIGATION  ~2000  . WISDOM TOOTH EXTRACTION      There were no vitals filed for this visit.  Subjective Assessment - 01/19/19 0908    Subjective  Reports stiffness upon arrival.     Pertinent History  Previous left knee injury.    Patient Stated Goals  See above.    Currently in Pain?  Yes    Pain Score  5     Pain Location  Knee    Pain Orientation  Left    Pain  Descriptors / Indicators  Tightness    Pain Type  Surgical pain    Pain Onset  1 to 4 weeks ago    Pain Frequency  Intermittent         [facility] PT Assessment - 01/19/19 0001      Assessment   Medical Diagnosis  S/p left total knee replacement.    Referring Provider (PT)  Jean Rosenthal MD.    Onset Date/Surgical Date  12/27/18    Next MD Visit  02/07/2019      Restrictions   Weight Bearing Restrictions  No                   [facility] Adult PT Treatment/Exercise - 01/19/19 0001      Knee/Hip Exercises: Aerobic   Nustep  L4, seat 9 x12 min      Knee/Hip Exercises: Standing   Hip Flexion  AROM;Left;15 reps;Knee bent    Forward Lunges  Left;15 reps    Hip Abduction  AROM;Left;20 reps;Knee straight    Rocker Board  3 minutes      Knee/Hip Exercises: Supine   Short Arc Quad Sets  Strengthening;Left;2 sets;10 reps  Heel Slides  AROM;Left;20 reps      Modalities   Modalities  Health visitor Stimulation Location  Left knee.    Electrical Stimulation Action  IFC    Electrical Stimulation Parameters  80-150 hz x15 min    Electrical Stimulation Goals  Edema;Pain      Vasopneumatic   Number Minutes Vasopneumatic   15 minutes    Vasopnuematic Location   Knee    Vasopneumatic Pressure  Low    Vasopneumatic Temperature   62      Manual Therapy   Manual Therapy  Passive ROM    Passive ROM  PROM of L knee measured into flexion, extension with gentle holds at end range and oscillations                  PT Long Term Goals - 01/16/19 1100      PT LONG TERM GOAL #1   Title  Independent with a HEP.    Time  6    Period  Weeks    Status  New      PT LONG TERM GOAL #2   Title  Full left active knee extension in order to normalize gait.    Time  6    Period  Weeks    Status  New      PT LONG TERM GOAL #3   Title  Active knee flexion to 115-120 degrees+ so the patient can perform  functional tasks and do so with pain not > 2-3/10.    Time  6    Period  Weeks    Status  New      PT LONG TERM GOAL #4   Title  Perform a reciprocating stair gait with one railing with pain not > 2-3/10.    Time  6    Period  Weeks    Status  New            Plan - 01/19/19 1021    Clinical Impression Statement  Patient presented in clinic today with reports of L knee tightness but greater discomfort in late afternoons and nighttime. Patient able to complete exercises in supine and standing but reports of fatigue. Increased edema present throughout L knee with patient encouraged heavily to ice and elevate above her heart every few hours for 15-20 minutes. Patient limited by discomfort during PROM and intermittant oscillations required as well to promote relaxation. Steristrips in place over inferior incision. TED hose donned to LLE and patient encouraged to utilize TED hose until next MD visit. Normal modalities response noted following removal of the modalities. Patient compliant with HEP provided by HHPT at this time.    Rehab Potential  Excellent    PT Frequency  2x / week    PT Duration  6 weeks    PT Treatment/Interventions  ADLs/Self Care Home Management;Cryotherapy;Occupational psychologist;Therapeutic activities;Therapeutic exercise;Patient/family education;Manual techniques;Vasopneumatic Device;Passive range of motion    PT Next Visit Plan  Nustep with progression to stataionary bike, PROM, strengthening.  Vasopneulatic and electrical stimulation.    Consulted and Agree with Plan of Care  Patient       Patient will benefit from skilled therapeutic intervention in order to improve the following deficits and impairments:  Abnormal gait, Pain, Decreased activity tolerance, Decreased strength, Decreased range of motion, Increased edema  Visit Diagnosis: Stiffness of left knee, not elsewhere classified  Localized edema  Chronic pain of left  knee     Problem List Patient Active Problem List   Diagnosis Date Noted  . 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/27/2018  . Unilateral primary osteoarthritis, left knee 11/01/2018  . Primary osteoarthritis of left knee 11/15/2017  . Dense breast tissue on mammogram 10/21/2016  . HLD (hyperlipidemia) 10/21/2016  . LVH (left ventricular hypertrophy) 10/21/2016  . Mitral regurgitation 10/21/2016  . Abnormal uterine bleeding (AUB) 10/11/2015  . Hypertension 10/01/2015  . Menorrhagia with regular cycle 06/26/2014  . ADD (attention deficit disorder) 02/18/2012  . Hypothyroidism 12/30/2011    Standley Brooking, PTA 01/19/2019, 10:53 AM  [facility] [address] [place], [place], [address] Phone: [phone]   Fax:  [phone]  Name: [name] MRN: [id number] Date of Birth: [birth year]

## 2019-01-23 ENCOUNTER — Ambulatory Visit: Payer: No Typology Code available for payment source | Admitting: Physical Therapy

## 2019-01-23 ENCOUNTER — Encounter: Payer: Self-pay | Admitting: Physical Therapy

## 2019-01-23 DIAGNOSIS — M25662 Stiffness of left knee, not elsewhere classified: Secondary | ICD-10-CM | POA: Diagnosis not present

## 2019-01-23 DIAGNOSIS — G8929 Other chronic pain: Secondary | ICD-10-CM

## 2019-01-23 DIAGNOSIS — R600 Localized edema: Secondary | ICD-10-CM

## 2019-01-23 DIAGNOSIS — M25562 Pain in left knee: Secondary | ICD-10-CM

## 2019-01-23 NOTE — Therapy (Signed)
[facility] [place], [place], [address] Phone: [phone]   Fax:  [phone]  Physical Therapy Treatment  Patient Details  Name: [name] MRN: [id number] Date of Birth: 11-12-1971 Referring Provider (PT): Jean Rosenthal MD.   Encounter Date: 01/23/2019  PT End of Session - 01/23/19 0856    Visit Number  3    Number of Visits  12    Date for PT Re-Evaluation  02/27/19    Authorization Type  FOTO AT LEAST EVERY 5TH VISITS.  PROGRESS NOTE AT 10TH VISIT.    PT Start Time  0901    PT Stop Time  1001    PT Time Calculation (min)  60 min    Activity Tolerance  Patient tolerated treatment well    Behavior During Therapy  WFL for tasks assessed/performed       Past Medical History:  Diagnosis Date  . Abnormal uterine bleeding (AUB) 10/11/2015  . ADD (attention deficit disorder)   . Anxiety   . Arthritis   . Chronic headache   . Depression   . Depression with anxiety 12/30/2011  . Edema   . GERD (gastroesophageal reflux disease)   . Heart murmur   . Heavy periods   . HLD (hyperlipidemia) 10/21/2016  . Hypertension   . Hypothyroidism   . Iron deficiency anemia   . Joint pain   . Pneumonia    patient doesnt remember this  . Thyroid nodule    ultrasound 10/13  . Vitamin D deficiency     Past Surgical History:  Procedure Laterality Date  . JOINT REPLACEMENT    . tooth implant  2016  . TOTAL KNEE ARTHROPLASTY Left 12/27/2018  . TOTAL KNEE ARTHROPLASTY Left 12/27/2018   Procedure: LEFT TOTAL KNEE ARTHROPLASTY;  Surgeon: Mcarthur Rossetti, MD;  Location: [facility];  Service: Orthopedics;  Laterality: Left;  . TUBAL LIGATION  ~2000  . WISDOM TOOTH EXTRACTION      There were no vitals filed for this visit.  Subjective Assessment - 01/23/19 0856    Subjective  Reports she has been icing and elevating a lot over the weekend.    Pertinent History  Previous left knee injury.    Patient Stated Goals   See above.    Currently in Pain?  Other (Comment)   No pain assessment provided by patient        [facility] PT Assessment - 01/23/19 0001      Assessment   Medical Diagnosis  S/p left total knee replacement.    Referring Provider (PT)  Jean Rosenthal MD.    Onset Date/Surgical Date  12/27/18    Next MD Visit  02/07/2019      Restrictions   Weight Bearing Restrictions  No                   [facility] Adult PT Treatment/Exercise - 01/23/19 0001      Knee/Hip Exercises: Aerobic   Nustep  L4, seat 10 x11 min      Knee/Hip Exercises: Standing   Forward Lunges  Left;15 reps    Forward Step Up  Left;2 sets;10 reps;Hand Hold: 2;Step Height: 6"    Functional Squat  15 reps    Rocker Board  4 minutes      Knee/Hip Exercises: Supine   Short Arc Quad Sets  AROM;Left;2 sets;10 reps      Modalities   Modalities  Firefighter  Electrical Stimulation Location  L VMO/Quad; L knee    Electrical Stimulation Action  VMS; IFC    Electrical Stimulation Parameters  10/10, pps, 50% duty x10 min; 80-150 hz x10 min    Electrical Stimulation Goals  Neuromuscular facilitation;Pain;Edema      Vasopneumatic   Number Minutes Vasopneumatic   10 minutes    Vasopnuematic Location   Knee    Vasopneumatic Pressure  Low    Vasopneumatic Temperature   60                  PT Long Term Goals - 01/16/19 1100      PT LONG TERM GOAL #1   Title  Independent with a HEP.    Time  6    Period  Weeks    Status  New      PT LONG TERM GOAL #2   Title  Full left active knee extension in order to normalize gait.    Time  6    Period  Weeks    Status  New      PT LONG TERM GOAL #3   Title  Active knee flexion to 115-120 degrees+ so the patient can perform functional tasks and do so with pain not > 2-3/10.    Time  6    Period  Weeks    Status  New      PT LONG TERM GOAL #4   Title  Perform a reciprocating stair gait with one  railing with pain not > 2-3/10.    Time  6    Period  Weeks    Status  New            Plan - 01/23/19 1145    Clinical Impression Statement  Patient presented in clinic with reports of compliance in regards to elevating and icing for edema control. Patient observed standing in L knee flexion during stance. Patient able to complete exercises although VCs provided to avoid hip circumduction. Very minimal L quad activation noted today in supine with AROM SAQ. [place] stimulation added with SAQ to enhance quad activation. Patient educated to continue HHPT HEP and to continue icing and elevating. One area in mid scar that was observed with minimal scabbing. No TED hose donned upon arrival in clinic. Normal modalities response noted following removal of the modalities.    Rehab Potential  Excellent    PT Frequency  2x / week    PT Duration  6 weeks    PT Treatment/Interventions  ADLs/Self Care Home Management;Cryotherapy;Occupational psychologist;Therapeutic activities;Therapeutic exercise;Patient/family education;Manual techniques;Vasopneumatic Device;Passive range of motion    PT Next Visit Plan  Nustep with progression to stataionary bike, PROM, strengthening.  Vasopneulatic and electrical stimulation.    Consulted and Agree with Plan of Care  Patient       Patient will benefit from skilled therapeutic intervention in order to improve the following deficits and impairments:  Abnormal gait, Pain, Decreased activity tolerance, Decreased strength, Decreased range of motion, Increased edema  Visit Diagnosis: Stiffness of left knee, not elsewhere classified  Localized edema  Chronic pain of left knee     Problem List Patient Active Problem List   Diagnosis Date Noted  . 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/27/2018  . Unilateral primary osteoarthritis, left knee 11/01/2018  . Primary osteoarthritis of left knee 11/15/2017  . Dense breast tissue on  mammogram 10/21/2016  . HLD (hyperlipidemia) 10/21/2016  . LVH (left ventricular hypertrophy) 10/21/2016  .  Mitral regurgitation 10/21/2016  . Abnormal uterine bleeding (AUB) 10/11/2015  . Hypertension 10/01/2015  . Menorrhagia with regular cycle 06/26/2014  . ADD (attention deficit disorder) 02/18/2012  . Hypothyroidism 12/30/2011    Standley Brooking, PTA 01/23/2019, 11:48 AM  [facility] [address] [place], [place], [address] Phone: [phone]   Fax:  [phone]  Name: ADRIELLE POLAKOWSKI MRN: [id number] Date of Birth: Jul 02, 1971

## 2019-01-24 ENCOUNTER — Other Ambulatory Visit (HOSPITAL_COMMUNITY): Payer: Self-pay | Admitting: Orthopaedic Surgery

## 2019-01-24 MED FILL — oxyCODONE HCL 5 MG TABS: 5 | 3 days supply | Qty: 40 | Fill #0

## 2019-01-24 NOTE — Telephone Encounter (Signed)
Please advise 

## 2019-01-25 ENCOUNTER — Ambulatory Visit: Payer: No Typology Code available for payment source | Admitting: Physical Therapy

## 2019-01-25 ENCOUNTER — Encounter: Payer: Self-pay | Admitting: Physical Therapy

## 2019-01-25 DIAGNOSIS — G8929 Other chronic pain: Secondary | ICD-10-CM

## 2019-01-25 DIAGNOSIS — M25662 Stiffness of left knee, not elsewhere classified: Secondary | ICD-10-CM

## 2019-01-25 DIAGNOSIS — M25562 Pain in left knee: Secondary | ICD-10-CM

## 2019-01-25 DIAGNOSIS — R600 Localized edema: Secondary | ICD-10-CM

## 2019-01-25 NOTE — Therapy (Signed)
[facility] [place], [place], [address] Phone: [phone]   Fax:  [phone]  Physical Therapy Treatment  Patient Details  Name: [name] MRN: [id number] Date of Birth: 1971/12/11 Referring Provider (PT): Jean Rosenthal MD.   Encounter Date: 01/25/2019  PT End of Session - 01/25/19 0855    Visit Number  4    Number of Visits  12    Date for PT Re-Evaluation  02/27/19    Authorization Type  FOTO AT LEAST EVERY 5TH VISITS.  PROGRESS NOTE AT 10TH VISIT.    PT Start Time  0900    PT Stop Time  1000    PT Time Calculation (min)  60 min    Activity Tolerance  Patient tolerated treatment well    Behavior During Therapy  WFL for tasks assessed/performed       Past Medical History:  Diagnosis Date  . Abnormal uterine bleeding (AUB) 10/11/2015  . ADD (attention deficit disorder)   . Anxiety   . Arthritis   . Chronic headache   . Depression   . Depression with anxiety 12/30/2011  . Edema   . GERD (gastroesophageal reflux disease)   . Heart murmur   . Heavy periods   . HLD (hyperlipidemia) 10/21/2016  . Hypertension   . Hypothyroidism   . Iron deficiency anemia   . Joint pain   . Pneumonia    patient doesnt remember this  . Thyroid nodule    ultrasound 10/13  . Vitamin D deficiency     Past Surgical History:  Procedure Laterality Date  . JOINT REPLACEMENT    . tooth implant  2016  . TOTAL KNEE ARTHROPLASTY Left 12/27/2018  . TOTAL KNEE ARTHROPLASTY Left 12/27/2018   Procedure: LEFT TOTAL KNEE ARTHROPLASTY;  Surgeon: Mcarthur Rossetti, MD;  Location: [facility];  Service: Orthopedics;  Laterality: Left;  . TUBAL LIGATION  ~2000  . WISDOM TOOTH EXTRACTION      There were no vitals filed for this visit.  Subjective Assessment - 01/25/19 0855    Subjective  Reports weaning from pain medication and drove to [facility] yesterday and was worn out.    Pertinent History  Previous left knee injury.     Patient Stated Goals  See above.    Currently in Pain?  Yes    Pain Score  2     Pain Location  Knee    Pain Orientation  Left    Pain Descriptors / Indicators  Discomfort    Pain Type  Surgical pain    Pain Onset  1 to 4 weeks ago    Pain Frequency  Intermittent         [facility] PT Assessment - 01/25/19 0001      Assessment   Medical Diagnosis  S/p left total knee replacement.    Referring Provider (PT)  Jean Rosenthal MD.    Onset Date/Surgical Date  12/27/18    Next MD Visit  02/07/2019      Restrictions   Weight Bearing Restrictions  No      ROM / Strength   AROM / PROM / Strength  AROM      AROM   Overall AROM   Deficits    AROM Assessment Site  Knee    Right/Left Knee  Left    Left Knee Extension  13    Left Knee Flexion  95  [facility] Adult PT Treatment/Exercise - 01/25/19 0001      Knee/Hip Exercises: Aerobic   Nustep  L6, seat 8 x10 min      Knee/Hip Exercises: Standing   Forward Lunges  Left;15 reps    Functional Squat  15 reps    Rocker Board  3 minutes      Knee/Hip Exercises: Supine   Short Arc Quad Sets  Left;2 sets;10 reps;Other (comment)   with russian stimulation for neuro re-ed     Modalities   Modalities  Psychologist, educational Location  L VMO/Quad; L knee    Electrical Stimulation Action  VMS; IFC    Electrical Stimulation Parameters  10/10, 50 pps, 5 sec ramp, 50% x10 min; 80-150 hz x15 min    Electrical Stimulation Goals  Neuromuscular facilitation;Pain;Edema      Vasopneumatic   Number Minutes Vasopneumatic   15 minutes    Vasopnuematic Location   Knee    Vasopneumatic Pressure  Low    Vasopneumatic Temperature   55                  PT Long Term Goals - 01/25/19 1023      PT LONG TERM GOAL #1   Title  Independent with a HEP.    Time  6    Period  Weeks    Status  On-going      PT LONG TERM GOAL #2   Title  Full left  active knee extension in order to normalize gait.    Time  6    Period  Weeks    Status  On-going      PT LONG TERM GOAL #3   Title  Active knee flexion to 115-120 degrees+ so the patient can perform functional tasks and do so with pain not > 2-3/10.    Time  6    Period  Weeks    Status  On-going      PT LONG TERM GOAL #4   Title  Perform a reciprocating stair gait with one railing with pain not > 2-3/10.    Time  6    Period  Weeks    Status  On-going            Plan - 01/25/19 1015    Clinical Impression Statement  Patient presented in clinic with reports of discomfort more in posterior knee and superior calf last night. Area assessed by PTA with no remarkable redness or pain with palpation. Patient denies any other symptoms of blood clot. TED hose was donned today upon observation. AROM of L knee measured as 13-95 deg with continued lack of quad activation. [place] stimulation continued with SAQ to provide neuro re-ed of L quad. Increased edema still noted especially along anterior L knee. Normal modalities response noted following removal of the modalities.    Rehab Potential  Excellent    PT Frequency  2x / week    PT Duration  6 weeks    PT Treatment/Interventions  ADLs/Self Care Home Management;Cryotherapy;Occupational psychologist;Therapeutic activities;Therapeutic exercise;Patient/family education;Manual techniques;Vasopneumatic Device;Passive range of motion    PT Next Visit Plan  Nustep with progression to stataionary bike, PROM, strengthening.  Vasopneulatic and electrical stimulation.    Consulted and Agree with Plan of Care  Patient       Patient will benefit from skilled therapeutic intervention in order to improve the following deficits and impairments:  Abnormal gait,  Pain, Decreased activity tolerance, Decreased strength, Decreased range of motion, Increased edema  Visit Diagnosis: Stiffness of left knee, not elsewhere  classified  Localized edema  Chronic pain of left knee     Problem List Patient Active Problem List   Diagnosis Date Noted  . 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/27/2018  . Unilateral primary osteoarthritis, left knee 11/01/2018  . Primary osteoarthritis of left knee 11/15/2017  . Dense breast tissue on mammogram 10/21/2016  . HLD (hyperlipidemia) 10/21/2016  . LVH (left ventricular hypertrophy) 10/21/2016  . Mitral regurgitation 10/21/2016  . Abnormal uterine bleeding (AUB) 10/11/2015  . Hypertension 10/01/2015  . Menorrhagia with regular cycle 06/26/2014  . ADD (attention deficit disorder) 02/18/2012  . Hypothyroidism 12/30/2011    Standley Brooking, PTA 01/25/2019, 10:24 AM  [facility] [address] [place], [place], [address] Phone: [phone]   Fax:  [phone]  Name: [name] MRN: [id number] Date of Birth: Feb 09, 1971

## 2019-01-30 ENCOUNTER — Ambulatory Visit: Payer: No Typology Code available for payment source | Admitting: Physical Therapy

## 2019-01-30 ENCOUNTER — Encounter: Payer: Self-pay | Admitting: Physical Therapy

## 2019-01-30 DIAGNOSIS — M25662 Stiffness of left knee, not elsewhere classified: Secondary | ICD-10-CM

## 2019-01-30 DIAGNOSIS — M25562 Pain in left knee: Secondary | ICD-10-CM

## 2019-01-30 DIAGNOSIS — G8929 Other chronic pain: Secondary | ICD-10-CM

## 2019-01-30 DIAGNOSIS — R600 Localized edema: Secondary | ICD-10-CM

## 2019-01-30 NOTE — Therapy (Signed)
[facility] [place], [place], [address] Phone: [phone]   Fax:  [phone]  Physical Therapy Treatment  Patient Details  Name: [name] MRN: [id number] Date of Birth: [birth year] Referring Provider (PT): Jean Rosenthal MD.   Encounter Date: 01/30/2019  PT End of Session - 01/30/19 1034    Visit Number  5    Number of Visits  12    Date for PT Re-Evaluation  02/27/19    Authorization Type  FOTO AT LEAST EVERY 5TH VISITS.  PROGRESS NOTE AT 10TH VISIT.    PT Start Time  1032    PT Stop Time  1131    PT Time Calculation (min)  59 min    Activity Tolerance  Patient tolerated treatment well    Behavior During Therapy  WFL for tasks assessed/performed       Past Medical History:  Diagnosis Date  . Abnormal uterine bleeding (AUB) 10/11/2015  . ADD (attention deficit disorder)   . Anxiety   . Arthritis   . Chronic headache   . Depression   . Depression with anxiety 12/30/2011  . Edema   . GERD (gastroesophageal reflux disease)   . Heart murmur   . Heavy periods   . HLD (hyperlipidemia) 10/21/2016  . Hypertension   . Hypothyroidism   . Iron deficiency anemia   . Joint pain   . Pneumonia    patient doesnt remember this  . Thyroid nodule    ultrasound 10/13  . Vitamin D deficiency     Past Surgical History:  Procedure Laterality Date  . JOINT REPLACEMENT    . tooth implant  2016  . TOTAL KNEE ARTHROPLASTY Left 12/27/2018  . TOTAL KNEE ARTHROPLASTY Left 12/27/2018   Procedure: LEFT TOTAL KNEE ARTHROPLASTY;  Surgeon: Mcarthur Rossetti, MD;  Location: [facility];  Service: Orthopedics;  Laterality: Left;  . TUBAL LIGATION  ~2000  . WISDOM TOOTH EXTRACTION      There were no vitals filed for this visit.  Subjective Assessment - 01/30/19 1032    Subjective  Reports feeling down with her progress with her TKR. Reports tightness in L calf and posterior knee.     Pertinent History  Previous left knee  injury.    Patient Stated Goals  See above.    Currently in Pain?  Yes    Pain Score  2     Pain Location  Knee    Pain Orientation  Left    Pain Descriptors / Indicators  Discomfort    Pain Type  Surgical pain    Pain Onset  1 to 4 weeks ago    Pain Frequency  Intermittent         [facility] PT Assessment - 01/30/19 0001      Assessment   Medical Diagnosis  S/p left total knee replacement.    Referring Provider (PT)  Jean Rosenthal MD.    Onset Date/Surgical Date  12/27/18    Next MD Visit  02/07/2019      Restrictions   Weight Bearing Restrictions  No      Observation/Other Assessments-Edema    Edema  Circumferential      Circumferential Edema   Circumferential - Right  45.8 cm    Circumferential - Left   47.3 cm                   [facility] Adult PT Treatment/Exercise - 01/30/19 0001      Knee/Hip Exercises:  Stretches   Passive Hamstring Stretch  Left;3 reps;30 seconds    Gastroc Stretch  Left;3 reps;30 seconds      Knee/Hip Exercises: Aerobic   Nustep  L5, seat 10 x15 min      Knee/Hip Exercises: Standing   Forward Lunges  Left;5 reps   stopped due to pain   Rocker Board  3 minutes      Knee/Hip Exercises: Supine   Short Arc Quad Sets  Other (comment)   Russian VMS to L quad and VMO     Modalities   Modalities  Psychologist, educational Location  L VMO/Quad; L knee    Printmaker Action  Russian; IFC    Electrical Stimulation Parameters  10/10, 50 pps, 5 sec ramp x10 min;     Electrical Stimulation Goals  Neuromuscular facilitation;Pain;Edema      Vasopneumatic   Number Minutes Vasopneumatic   10 minutes    Vasopnuematic Location   Knee    Vasopneumatic Pressure  Low    Vasopneumatic Temperature   55                  PT Long Term Goals - 01/25/19 1023      PT LONG TERM GOAL #1   Title  Independent with a HEP.    Time  6    Period  Weeks    Status   On-going      PT LONG TERM GOAL #2   Title  Full left active knee extension in order to normalize gait.    Time  6    Period  Weeks    Status  On-going      PT LONG TERM GOAL #3   Title  Active knee flexion to 115-120 degrees+ so the patient can perform functional tasks and do so with pain not > 2-3/10.    Time  6    Period  Weeks    Status  On-going      PT LONG TERM GOAL #4   Title  Perform a reciprocating stair gait with one railing with pain not > 2-3/10.    Time  6    Period  Weeks    Status  On-going            Plan - 01/30/19 1114    Clinical Impression Statement  Patient presented in clinic with reports of increased edema and tenderness of L knee and calf respectively. Patient limited with lunges due to L knee pain and felt more stretch during rockerboard. Patient's calf assessed by PTA for edema as well as palpation assessment by Mali Applegate, MPT but patient denied any other symptoms and no abnormally high temperature or redness. Patient requested to continue russian VMS today. Normal modalities response noted following removal of the modalities.    Rehab Potential  Excellent    PT Frequency  2x / week    PT Duration  6 weeks    PT Treatment/Interventions  ADLs/Self Care Home Management;Cryotherapy;Occupational psychologist;Therapeutic activities;Therapeutic exercise;Patient/family education;Manual techniques;Vasopneumatic Device;Passive range of motion    PT Next Visit Plan  Nustep with progression to stataionary bike, PROM, strengthening.  Vasopneulatic and electrical stimulation.    Consulted and Agree with Plan of Care  Patient       Patient will benefit from skilled therapeutic intervention in order to improve the following deficits and impairments:  Abnormal gait, Pain, Decreased activity tolerance, Decreased strength, Decreased  range of motion, Increased edema  Visit Diagnosis: Stiffness of left knee, not elsewhere  classified  Localized edema  Chronic pain of left knee     Problem List Patient Active Problem List   Diagnosis Date Noted  . 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/27/2018  . Unilateral primary osteoarthritis, left knee 11/01/2018  . Primary osteoarthritis of left knee 11/15/2017  . Dense breast tissue on mammogram 10/21/2016  . HLD (hyperlipidemia) 10/21/2016  . LVH (left ventricular hypertrophy) 10/21/2016  . Mitral regurgitation 10/21/2016  . Abnormal uterine bleeding (AUB) 10/11/2015  . Hypertension 10/01/2015  . Menorrhagia with regular cycle 06/26/2014  . ADD (attention deficit disorder) 02/18/2012  . Hypothyroidism 12/30/2011    Standley Brooking, PTA 01/30/2019, 11:34 AM  [facility] [address] [place], [place], [address] Phone: [phone]   Fax:  [phone]  Name: GRACE VALLEY MRN: [id number] Date of Birth: [birth year]

## 2019-02-01 ENCOUNTER — Encounter: Payer: Self-pay | Admitting: Physical Therapy

## 2019-02-01 ENCOUNTER — Ambulatory Visit: Payer: No Typology Code available for payment source | Admitting: Physical Therapy

## 2019-02-01 DIAGNOSIS — M25662 Stiffness of left knee, not elsewhere classified: Secondary | ICD-10-CM | POA: Diagnosis not present

## 2019-02-01 DIAGNOSIS — M25562 Pain in left knee: Secondary | ICD-10-CM

## 2019-02-01 DIAGNOSIS — G8929 Other chronic pain: Secondary | ICD-10-CM

## 2019-02-01 DIAGNOSIS — R600 Localized edema: Secondary | ICD-10-CM

## 2019-02-01 NOTE — Therapy (Signed)
[facility] [place], [place], [address] Phone: [phone]   Fax:  [phone]  Physical Therapy Treatment  Patient Details  Name: [name] MRN: [id number] Date of Birth: 01-30-1971 Referring Provider (PT): Jean Rosenthal MD.   Encounter Date: 02/01/2019  PT End of Session - 02/01/19 0941    Visit Number  6    Number of Visits  12    Date for PT Re-Evaluation  02/27/19    Authorization Type  FOTO AT LEAST EVERY 5TH VISITS.  PROGRESS NOTE AT 10TH VISIT.    PT Start Time  [phone]    PT Stop Time  1041    PT Time Calculation (min)  55 min    Activity Tolerance  Patient tolerated treatment well    Behavior During Therapy  WFL for tasks assessed/performed       Past Medical History:  Diagnosis Date  . Abnormal uterine bleeding (AUB) 10/11/2015  . ADD (attention deficit disorder)   . Anxiety   . Arthritis   . Chronic headache   . Depression   . Depression with anxiety 12/30/2011  . Edema   . GERD (gastroesophageal reflux disease)   . Heart murmur   . Heavy periods   . HLD (hyperlipidemia) 10/21/2016  . Hypertension   . Hypothyroidism   . Iron deficiency anemia   . Joint pain   . Pneumonia    patient doesnt remember this  . Thyroid nodule    ultrasound 10/13  . Vitamin D deficiency     Past Surgical History:  Procedure Laterality Date  . JOINT REPLACEMENT    . tooth implant  2016  . TOTAL KNEE ARTHROPLASTY Left 12/27/2018  . TOTAL KNEE ARTHROPLASTY Left 12/27/2018   Procedure: LEFT TOTAL KNEE ARTHROPLASTY;  Surgeon: Mcarthur Rossetti, MD;  Location: [facility];  Service: Orthopedics;  Laterality: Left;  . TUBAL LIGATION  ~2000  . WISDOM TOOTH EXTRACTION      There were no vitals filed for this visit.  Subjective Assessment - 02/01/19 0941    Subjective  Reports walking around in her home without her AD. Reports also sitting on toilet without any assistance this morning.    Pertinent History   Previous left knee injury.    Patient Stated Goals  See above.    Currently in Pain?  Other (Comment)   No pain rating provided during treatment        [facility] PT Assessment - 02/01/19 0001      Assessment   Medical Diagnosis  S/p left total knee replacement.    Referring Provider (PT)  Jean Rosenthal MD.    Onset Date/Surgical Date  12/27/18    Next MD Visit  02/07/2019      Restrictions   Weight Bearing Restrictions  No      ROM / Strength   AROM / PROM / Strength  AROM      AROM   Overall AROM   Deficits    AROM Assessment Site  Knee    Right/Left Knee  Left    Left Knee Extension  17    Left Knee Flexion  101                   [facility] Adult PT Treatment/Exercise - 02/01/19 0001      Knee/Hip Exercises: Aerobic   Nustep  L6, seat 9-8 x10 min      Knee/Hip Exercises: Standing   Forward Lunges  Left;15 reps  Functional Squat  15 reps    Rocker Board  4 minutes      Knee/Hip Exercises: Supine   Short Arc Quad Sets  Other (comment)   Russian stimulation for quad activation     Modalities   Modalities  Psychologist, educational Location  L VMO/Quad; L knee    Printmaker Action  Russian; IFC    Electrical Stimulation Parameters  10/10, 50 pps, 5 sec ramp, 50% x10 min; 80-150 hz x10 min    Electrical Stimulation Goals  Neuromuscular facilitation;Pain;Edema      Vasopneumatic   Number Minutes Vasopneumatic   10 minutes    Vasopnuematic Location   Knee    Vasopneumatic Pressure  Medium    Vasopneumatic Temperature   55      Manual Therapy   Manual Therapy  Soft tissue mobilization    Soft tissue mobilization  Edema massage to L knee to reduce edema throughout anterior L knee                  PT Long Term Goals - 01/25/19 1023      PT LONG TERM GOAL #1   Title  Independent with a HEP.    Time  6    Period  Weeks    Status  On-going      PT LONG TERM  GOAL #2   Title  Full left active knee extension in order to normalize gait.    Time  6    Period  Weeks    Status  On-going      PT LONG TERM GOAL #3   Title  Active knee flexion to 115-120 degrees+ so the patient can perform functional tasks and do so with pain not > 2-3/10.    Time  6    Period  Weeks    Status  On-going      PT LONG TERM GOAL #4   Title  Perform a reciprocating stair gait with one railing with pain not > 2-3/10.    Time  6    Period  Weeks    Status  On-going            Plan - 02/01/19 1022    Clinical Impression Statement  Patient able to report feeling better overall today and able to sit on toilet without holding onto anything. Able to walk around within her home without AD but still using it outside of her home. Patient able to complete therex as directed with only minimal intermittant reports of discomfort. Edema massage required today due to the continued increased edema surrounding the L knee. [place] stimulation continued to further improve L quad activation. L knee extension ROM limited but possibly due to such increased edema of L knee and deficient quad activation. Normal modalities response noted following removal of the modalities.    Rehab Potential  Excellent    PT Frequency  2x / week    PT Duration  6 weeks    PT Treatment/Interventions  ADLs/Self Care Home Management;Cryotherapy;Occupational psychologist;Therapeutic activities;Therapeutic exercise;Patient/family education;Manual techniques;Vasopneumatic Device;Passive range of motion    PT Next Visit Plan  Nustep with progression to stataionary bike, PROM, strengthening.  Vasopneulatic and electrical stimulation.    Consulted and Agree with Plan of Care  Patient       Patient will benefit from skilled therapeutic intervention in order to improve the following deficits and impairments:  Abnormal  gait, Pain, Decreased activity tolerance, Decreased strength, Decreased  range of motion, Increased edema  Visit Diagnosis: Stiffness of left knee, not elsewhere classified  Localized edema  Chronic pain of left knee     Problem List Patient Active Problem List   Diagnosis Date Noted  . 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/27/2018  . Unilateral primary osteoarthritis, left knee 11/01/2018  . Primary osteoarthritis of left knee 11/15/2017  . Dense breast tissue on mammogram 10/21/2016  . HLD (hyperlipidemia) 10/21/2016  . LVH (left ventricular hypertrophy) 10/21/2016  . Mitral regurgitation 10/21/2016  . Abnormal uterine bleeding (AUB) 10/11/2015  . Hypertension 10/01/2015  . Menorrhagia with regular cycle 06/26/2014  . ADD (attention deficit disorder) 02/18/2012  . Hypothyroidism 12/30/2011    Standley Brooking, PTA 02/01/2019, 10:48 AM  [facility] [address] [place], [place], [address] Phone: [phone]   Fax:  [phone]  Name: [name] MRN: [id number] Date of Birth: August 20, 1971

## 2019-02-06 ENCOUNTER — Encounter: Payer: Self-pay | Admitting: Physical Therapy

## 2019-02-06 ENCOUNTER — Ambulatory Visit: Payer: No Typology Code available for payment source | Admitting: Physical Therapy

## 2019-02-06 DIAGNOSIS — M25662 Stiffness of left knee, not elsewhere classified: Secondary | ICD-10-CM

## 2019-02-06 DIAGNOSIS — G8929 Other chronic pain: Secondary | ICD-10-CM

## 2019-02-06 DIAGNOSIS — R600 Localized edema: Secondary | ICD-10-CM

## 2019-02-06 DIAGNOSIS — M25562 Pain in left knee: Secondary | ICD-10-CM

## 2019-02-06 NOTE — Therapy (Signed)
[facility] [place], [place], [address] Phone: [phone]   Fax:  [phone]  Physical Therapy Treatment  Patient Details  Name: [name] MRN: [id number] Date of Birth: 24-Sep-1971 Referring Provider (PT): Jean Rosenthal MD.   Encounter Date: 02/06/2019  PT End of Session - 02/06/19 0900    Visit Number  7    Number of Visits  12    Date for PT Re-Evaluation  02/27/19    Authorization Type  FOTO AT LEAST EVERY 5TH VISITS.  PROGRESS NOTE AT 10TH VISIT.    PT Start Time  0903    PT Stop Time  1001    PT Time Calculation (min)  58 min    Activity Tolerance  Patient tolerated treatment well    Behavior During Therapy  WFL for tasks assessed/performed       Past Medical History:  Diagnosis Date  . Abnormal uterine bleeding (AUB) 10/11/2015  . ADD (attention deficit disorder)   . Anxiety   . Arthritis   . Chronic headache   . Depression   . Depression with anxiety 12/30/2011  . Edema   . GERD (gastroesophageal reflux disease)   . Heart murmur   . Heavy periods   . HLD (hyperlipidemia) 10/21/2016  . Hypertension   . Hypothyroidism   . Iron deficiency anemia   . Joint pain   . Pneumonia    patient doesnt remember this  . Thyroid nodule    ultrasound 10/13  . Vitamin D deficiency     Past Surgical History:  Procedure Laterality Date  . JOINT REPLACEMENT    . tooth implant  2016  . TOTAL KNEE ARTHROPLASTY Left 12/27/2018  . TOTAL KNEE ARTHROPLASTY Left 12/27/2018   Procedure: LEFT TOTAL KNEE ARTHROPLASTY;  Surgeon: Mcarthur Rossetti, MD;  Location: [place];  Service: Orthopedics;  Laterality: Left;  . TUBAL LIGATION  ~2000  . WISDOM TOOTH EXTRACTION      There were no vitals filed for this visit.  Subjective Assessment - 02/06/19 0900    Subjective  Reports taking only two extra strength tylenol as she is weaning from pain medication and rates pain as 4/10.    Pertinent History  Previous  left knee injury.    Patient Stated Goals  See above.    Currently in Pain?  Yes    Pain Score  4     Pain Location  Knee    Pain Orientation  Left    Pain Descriptors / Indicators  Discomfort    Pain Type  Surgical pain    Pain Onset  More than a month ago         [facility] PT Assessment - 02/06/19 0001      Assessment   Medical Diagnosis  S/p left total knee replacement.    Referring Provider (PT)  Jean Rosenthal MD.    Onset Date/Surgical Date  12/27/18    Next MD Visit  02/07/2019      Restrictions   Weight Bearing Restrictions  No      Observation/Other Assessments-Edema    Edema  Circumferential      Circumferential Edema   Circumferential - Right  44.9 cm    Circumferential - Left   46.9 cm      ROM / Strength   AROM / PROM / Strength  AROM      AROM   Overall AROM   Deficits    AROM Assessment Site  Knee  Right/Left Knee  Left    Left Knee Extension  17    Left Knee Flexion  101                   [facility] Adult PT Treatment/Exercise - 02/06/19 0001      Knee/Hip Exercises: Aerobic   Nustep  L4, seat 8 x12 min      Knee/Hip Exercises: Standing   Forward Lunges  Left;20 reps    Functional Squat  15 reps    Rocker Board  4 minutes      Knee/Hip Exercises: Supine   Short Arc Quad Sets  Other (comment)   [place] VMS with SAQ for neuro re-ed     Modalities   Modalities  Psychologist, educational Location  L VMO/Quad; L knee    Printmaker Action  Russian; IFC    Electrical Stimulation Parameters  10/10, 50 pps, 5 sec ramp, 50% x10 min; 80-150 hz x10 min    Electrical Stimulation Goals  Neuromuscular facilitation;Pain;Edema      Vasopneumatic   Number Minutes Vasopneumatic   10 minutes    Vasopnuematic Location   Knee    Vasopneumatic Pressure  Medium    Vasopneumatic Temperature   55                  PT Long Term Goals - 01/25/19 1023      PT  LONG TERM GOAL #1   Title  Independent with a HEP.    Time  6    Period  Weeks    Status  On-going      PT LONG TERM GOAL #2   Title  Full left active knee extension in order to normalize gait.    Time  6    Period  Weeks    Status  On-going      PT LONG TERM GOAL #3   Title  Active knee flexion to 115-120 degrees+ so the patient can perform functional tasks and do so with pain not > 2-3/10.    Time  6    Period  Weeks    Status  On-going      PT LONG TERM GOAL #4   Title  Perform a reciprocating stair gait with one railing with pain not > 2-3/10.    Time  6    Period  Weeks    Status  On-going            Plan - 02/06/19 0940    Clinical Impression Statement  Patient still limited by L knee edema and quad inactivity. Patient unable to detect L quad activation during ambulation even with pronounced heel strike. 2 cm difference with L knee greater than R knee. Patient instructed to continue ice and elevating but to rest with another pillow under her L foot to stretch more into knee extension. AROM of L knee measured as 17-101 deg. Patient limited recently with sleep as she is weaned off pain medication to extra strength tylenol. Normal modalities response noted following removal of the modalities.         Rehab Potential  Excellent    PT Frequency  2x / week    PT Duration  6 weeks    PT Treatment/Interventions  ADLs/Self Care Home Management;Cryotherapy;Occupational psychologist;Therapeutic activities;Therapeutic exercise;Patient/family education;Manual techniques;Vasopneumatic Device;Passive range of motion    PT Next Visit Plan  Progress to stationary bike for partial  revolutions.    Consulted and Agree with Plan of Care  Patient       Patient will benefit from skilled therapeutic intervention in order to improve the following deficits and impairments:  Abnormal gait, Pain, Decreased activity tolerance, Decreased strength, Decreased range of  motion, Increased edema  Visit Diagnosis: Stiffness of left knee, not elsewhere classified  Localized edema  Chronic pain of left knee     Problem List Patient Active Problem List   Diagnosis Date Noted  . 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/27/2018  . Unilateral primary osteoarthritis, left knee 11/01/2018  . Primary osteoarthritis of left knee 11/15/2017  . Dense breast tissue on mammogram 10/21/2016  . HLD (hyperlipidemia) 10/21/2016  . LVH (left ventricular hypertrophy) 10/21/2016  . Mitral regurgitation 10/21/2016  . Abnormal uterine bleeding (AUB) 10/11/2015  . Hypertension 10/01/2015  . Menorrhagia with regular cycle 06/26/2014  . ADD (attention deficit disorder) 02/18/2012  . Hypothyroidism 12/30/2011    Standley Brooking, PTA 02/06/19 10:01 AM   [facility] [address] [place], [place], [address] Phone: [phone]   Fax:  [phone]  Name: [name] MRN: [id number] Date of Birth: 1971/07/13

## 2019-02-08 ENCOUNTER — Encounter (INDEPENDENT_AMBULATORY_CARE_PROVIDER_SITE_OTHER): Payer: Self-pay | Admitting: Orthopaedic Surgery

## 2019-02-08 ENCOUNTER — Ambulatory Visit (INDEPENDENT_AMBULATORY_CARE_PROVIDER_SITE_OTHER): Payer: No Typology Code available for payment source | Admitting: Orthopaedic Surgery

## 2019-02-08 DIAGNOSIS — Z96652 Presence of left artificial knee joint: Secondary | ICD-10-CM

## 2019-02-08 NOTE — Progress Notes (Signed)
The patient is now 6 weeks status post a left total knee arthroplasty.  She has good days and bad days and is making some progress with therapy.  She says the best they have flexed her back to is 101 degrees.  She still has problems sleeping at night with swelling and tightness in the knee.  She states that her quads are still significantly weak.  On exam she lacks full extension by about 3 degrees.  I can only flex her in the office today to right at 90 degrees.  Her calf is soft.  Her quads are certainly weak.  Incisions well-healed.  She has moderate swelling of her knee.  This is more swelling that is related to surgery itself and not an effusion to drain off.  I told her that usually this is more of blood that sits in the knee for a while and will slowly dissolve with time.  She will continue aggressive therapy as an outpatient as well as at home pushing herself daily on her own.  She would like to know to return to at least half day work duties starting Monday, March 2.  I agree with this since she works from home mainly.  She will only do 4-hour days daily for the first 2 weeks and then following that can resume full work hours.  She is already off of narcotics.  She can stop her aspirin at this point as well.  We will see her back in 4 weeks to see how she is doing overall for repeat exam but no x-rays are needed.

## 2019-02-09 ENCOUNTER — Encounter: Payer: Self-pay | Admitting: Physical Therapy

## 2019-02-09 ENCOUNTER — Ambulatory Visit: Payer: No Typology Code available for payment source | Admitting: Physical Therapy

## 2019-02-09 DIAGNOSIS — M25662 Stiffness of left knee, not elsewhere classified: Secondary | ICD-10-CM | POA: Diagnosis not present

## 2019-02-09 DIAGNOSIS — G8929 Other chronic pain: Secondary | ICD-10-CM

## 2019-02-09 DIAGNOSIS — R600 Localized edema: Secondary | ICD-10-CM

## 2019-02-09 DIAGNOSIS — M25562 Pain in left knee: Secondary | ICD-10-CM

## 2019-02-09 NOTE — Therapy (Signed)
[facility] [place], [place], [address] Phone: [phone]   Fax:  [phone]  Physical Therapy Treatment  Patient Details  Name: [name] MRN: [id number] Date of Birth: 11/19/1971 Referring Provider (PT): Jean Rosenthal MD.   Encounter Date: 02/09/2019  PT End of Session - 02/09/19 0904    Visit Number  8    Number of Visits  12    Date for PT Re-Evaluation  02/27/19    Authorization Type  FOTO AT LEAST EVERY 5TH VISITS.  PROGRESS NOTE AT 10TH VISIT.    PT Start Time  0901    PT Stop Time  1000    PT Time Calculation (min)  59 min    Activity Tolerance  Patient tolerated treatment well    Behavior During Therapy  WFL for tasks assessed/performed       Past Medical History:  Diagnosis Date  . Abnormal uterine bleeding (AUB) 10/11/2015  . ADD (attention deficit disorder)   . Anxiety   . Arthritis   . Chronic headache   . Depression   . Depression with anxiety 12/30/2011  . Edema   . GERD (gastroesophageal reflux disease)   . Heart murmur   . Heavy periods   . HLD (hyperlipidemia) 10/21/2016  . Hypertension   . Hypothyroidism   . Iron deficiency anemia   . Joint pain   . Pneumonia    patient doesnt remember this  . Thyroid nodule    ultrasound 10/13  . Vitamin D deficiency     Past Surgical History:  Procedure Laterality Date  . JOINT REPLACEMENT    . tooth implant  2016  . TOTAL KNEE ARTHROPLASTY Left 12/27/2018  . TOTAL KNEE ARTHROPLASTY Left 12/27/2018   Procedure: LEFT TOTAL KNEE ARTHROPLASTY;  Surgeon: Mcarthur Rossetti, MD;  Location: [place];  Service: Orthopedics;  Laterality: Left;  . TUBAL LIGATION  ~2000  . WISDOM TOOTH EXTRACTION      There were no vitals filed for this visit.  Subjective Assessment - 02/09/19 0902    Subjective  Reports that Dr. Ninfa Linden was pleased with her progress and stated that swelling was normal. Reports getting some sleep last night as well.  Reported feeling tingling this morning upon waking around L patella.  (Pended)     Pertinent History  Previous left knee injury.    Patient Stated Goals  See above.    Currently in Pain?  Yes    Pain Score  1     Pain Location  Knee    Pain Orientation  Left    Pain Descriptors / Indicators  Discomfort    Pain Type  Surgical pain    Pain Onset  More than a month ago         [facility] PT Assessment - 02/09/19 0001      Assessment   Medical Diagnosis  S/p left total knee replacement.    Referring Provider (PT)  Jean Rosenthal MD.    Onset Date/Surgical Date  12/27/18    Next MD Visit  03/08/2019      Restrictions   Weight Bearing Restrictions  No      ROM / Strength   AROM / PROM / Strength  AROM      AROM   Overall AROM   Deficits    AROM Assessment Site  Knee    Right/Left Knee  Left    Left Knee Extension  9    Left Knee Flexion  101                   [facility] Adult PT Treatment/Exercise - 02/09/19 0001      Knee/Hip Exercises: Aerobic   Stationary Bike  Seat 8 x10 min      Knee/Hip Exercises: Standing   Forward Lunges  Left;20 reps    Functional Squat  20 reps    Rocker Board  3 minutes      Knee/Hip Exercises: Supine   Short Arc Quad Sets  Other (comment)   with [place] stim for quad neuro re-ed   Straight Leg Raises  Other (comment)   1 rep with extensor lag     Modalities   Modalities  Psychologist, educational Location  L VMO/Quad; L knee    Electrical Stimulation Action  Russian; IFC    Electrical Stimulation Parameters  10/10, 50 pps, 5 sec ramp x15 min; 80-150 hz x15 min    Electrical Stimulation Goals  Neuromuscular facilitation;Pain;Edema      Vasopneumatic   Number Minutes Vasopneumatic   15 minutes   with foot propped into extension   Vasopnuematic Location   Knee    Vasopneumatic Pressure  Low    Vasopneumatic Temperature   36             PT Education -  02/09/19 0948    Education Details  Use of rocking chair as well as foam extension device provided by hospital for ROM    Person(s) Educated  Patient    Methods  Explanation    Comprehension  Verbalized understanding          PT Long Term Goals - 01/25/19 1023      PT LONG TERM GOAL #1   Title  Independent with a HEP.    Time  6    Period  Weeks    Status  On-going      PT LONG TERM GOAL #2   Title  Full left active knee extension in order to normalize gait.    Time  6    Period  Weeks    Status  On-going      PT LONG TERM GOAL #3   Title  Active knee flexion to 115-120 degrees+ so the patient can perform functional tasks and do so with pain not > 2-3/10.    Time  6    Period  Weeks    Status  On-going      PT LONG TERM GOAL #4   Title  Perform a reciprocating stair gait with one railing with pain not > 2-3/10.    Time  6    Period  Weeks    Status  On-going            Plan - 02/09/19 0931    Clinical Impression Statement  Patient presented in clinic with less L knee pain and was able to sleep better last night. Patient very encouraged by her ability to perform full revolutions on stationary bike at seat 8. Edema still present around L knee and quads still demonstrate weakness as SLR attempted with moderate extensor lag. Patient encouraged to utilize rocking chair for ROM as well as foam stretchor into extensor. Patient continues to be compliant with HEP and RICE at home. AROM of L knee measured as 9-101 deg. [place] stimulation continued with SAQ to continue quad strengthening. Normal modalities response noted following removal of the modalities.  Rehab Potential  Excellent    PT Frequency  2x / week    PT Duration  6 weeks    PT Treatment/Interventions  ADLs/Self Care Home Management;Cryotherapy;Occupational psychologist;Therapeutic activities;Therapeutic exercise;Patient/family education;Manual techniques;Vasopneumatic Device;Passive  range of motion    PT Next Visit Plan  Continue ROM progression on stationary bike and quad strengthening per POC.    Consulted and Agree with Plan of Care  Patient       Patient will benefit from skilled therapeutic intervention in order to improve the following deficits and impairments:  Abnormal gait, Pain, Decreased activity tolerance, Decreased strength, Decreased range of motion, Increased edema  Visit Diagnosis: Stiffness of left knee, not elsewhere classified  Localized edema  Chronic pain of left knee     Problem List Patient Active Problem List   Diagnosis Date Noted  . 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/27/2018  . Unilateral primary osteoarthritis, left knee 11/01/2018  . Primary osteoarthritis of left knee 11/15/2017  . Dense breast tissue on mammogram 10/21/2016  . HLD (hyperlipidemia) 10/21/2016  . LVH (left ventricular hypertrophy) 10/21/2016  . Mitral regurgitation 10/21/2016  . Abnormal uterine bleeding (AUB) 10/11/2015  . Hypertension 10/01/2015  . Menorrhagia with regular cycle 06/26/2014  . ADD (attention deficit disorder) 02/18/2012  . Hypothyroidism 12/30/2011    Standley Brooking, PTA 02/09/2019, 10:34 AM  [facility] [address] [place], [place], [address] Phone: [phone]   Fax:  [phone]  Name: KELYN PONCIANO MRN: [id number] Date of Birth: 05-10-1971

## 2019-02-14 ENCOUNTER — Ambulatory Visit: Payer: No Typology Code available for payment source | Attending: Orthopaedic Surgery | Admitting: Physical Therapy

## 2019-02-14 DIAGNOSIS — M25562 Pain in left knee: Secondary | ICD-10-CM | POA: Insufficient documentation

## 2019-02-14 DIAGNOSIS — G8929 Other chronic pain: Secondary | ICD-10-CM | POA: Diagnosis present

## 2019-02-14 DIAGNOSIS — M25662 Stiffness of left knee, not elsewhere classified: Secondary | ICD-10-CM | POA: Diagnosis not present

## 2019-02-14 DIAGNOSIS — R600 Localized edema: Secondary | ICD-10-CM | POA: Diagnosis present

## 2019-02-14 NOTE — Therapy (Signed)
[facility] [place], [place], [address] Phone: [phone]   Fax:  [phone]  Physical Therapy Treatment  Patient Details  Name: [name] MRN: [id number] Date of Birth: October 24, 1971 Referring Provider (PT): Jean Rosenthal MD.   Encounter Date: 02/14/2019  PT End of Session - 02/14/19 1303    Visit Number  9    Number of Visits  12    Date for PT Re-Evaluation  02/27/19    Authorization Type  FOTO AT LEAST EVERY 5TH VISITS.  PROGRESS NOTE AT 10TH VISIT.    PT Start Time  1302    PT Stop Time  1403    PT Time Calculation (min)  61 min    Activity Tolerance  Patient tolerated treatment well    Behavior During Therapy  WFL for tasks assessed/performed       Past Medical History:  Diagnosis Date  . Abnormal uterine bleeding (AUB) 10/11/2015  . ADD (attention deficit disorder)   . Anxiety   . Arthritis   . Chronic headache   . Depression   . Depression with anxiety 12/30/2011  . Edema   . GERD (gastroesophageal reflux disease)   . Heart murmur   . Heavy periods   . HLD (hyperlipidemia) 10/21/2016  . Hypertension   . Hypothyroidism   . Iron deficiency anemia   . Joint pain   . Pneumonia    patient doesnt remember this  . Thyroid nodule    ultrasound 10/13  . Vitamin D deficiency     Past Surgical History:  Procedure Laterality Date  . JOINT REPLACEMENT    . tooth implant  2016  . TOTAL KNEE ARTHROPLASTY Left 12/27/2018  . TOTAL KNEE ARTHROPLASTY Left 12/27/2018   Procedure: LEFT TOTAL KNEE ARTHROPLASTY;  Surgeon: Mcarthur Rossetti, MD;  Location: [facility];  Service: Orthopedics;  Laterality: Left;  . TUBAL LIGATION  ~2000  . WISDOM TOOTH EXTRACTION      There were no vitals filed for this visit.  Subjective Assessment - 02/14/19 1300    Subjective  Reports she did various exercises this morning. Reports more swelling over the weekend but was on her feet a lot over the weekend.    Pertinent  History  Previous left knee injury.    Patient Stated Goals  See above.    Currently in Pain?  Yes    Pain Score  1     Pain Location  Knee    Pain Orientation  Left    Pain Descriptors / Indicators  Discomfort    Pain Type  Surgical pain    Pain Onset  More than a month ago         [facility] PT Assessment - 02/14/19 0001      Assessment   Medical Diagnosis  S/p left total knee replacement.    Referring Provider (PT)  Jean Rosenthal MD.    Onset Date/Surgical Date  12/27/18    Next MD Visit  03/08/2019      Restrictions   Weight Bearing Restrictions  No                   [facility] Adult PT Treatment/Exercise - 02/14/19 0001      Knee/Hip Exercises: Aerobic   Stationary Bike  Seat 8 x10 min      Knee/Hip Exercises: Standing   Forward Lunges  Left;20 reps    Lateral Step Up  Left;2 sets;10 reps;Hand Hold: 2;Step Height: 6"  Forward Step Up  Left;2 sets;10 reps;Hand Hold: 2;Step Height: 6"    Rocker Board  3 minutes      Knee/Hip Exercises: Supine   Short Arc [employer]  Other (comment)   with [place] VMS      Modalities   Modalities  Psychologist, educational Location  L VMO/Quad; L knee    Chartered certified accountant  Russian; IFC    Electrical Stimulation Parameters  10/10, 50 pps, 5 sec hold, 50% x15 min; 80-150 hz x15 min    Electrical Stimulation Goals  Neuromuscular facilitation;Pain;Edema      Vasopneumatic   Number Minutes Vasopneumatic   15 minutes    Vasopnuematic Location   Knee    Vasopneumatic Pressure  Low    Vasopneumatic Temperature   36                  PT Long Term Goals - 01/25/19 1023      PT LONG TERM GOAL #1   Title  Independent with a HEP.    Time  6    Period  Weeks    Status  On-going      PT LONG TERM GOAL #2   Title  Full left active knee extension in order to normalize gait.    Time  6    Period  Weeks    Status  On-going      PT LONG  TERM GOAL #3   Title  Active knee flexion to 115-120 degrees+ so the patient can perform functional tasks and do so with pain not > 2-3/10.    Time  6    Period  Weeks    Status  On-going      PT LONG TERM GOAL #4   Title  Perform a reciprocating stair gait with one railing with pain not > 2-3/10.    Time  6    Period  Weeks    Status  On-going            Plan - 02/14/19 1353    Clinical Impression Statement  Patient presented in clinic with less L knee pain and edema. Notices more edema if she is on her feet for a long period of time per patient reports. Patient continues to lack full voluntary quad activation with SAQ. Reporting functional improvements with sitting on toilet and overall mobility. Normal modalities response noted following removal of the modalities. 9th visit FOTO score 36%, current status: CJ.    Rehab Potential  Excellent    PT Frequency  2x / week    PT Duration  6 weeks    PT Treatment/Interventions  ADLs/Self Care Home Management;Cryotherapy;Occupational psychologist;Therapeutic activities;Therapeutic exercise;Patient/family education;Manual techniques;Vasopneumatic Device;Passive range of motion    PT Next Visit Plan  Continue ROM progression on stationary bike and quad strengthening per POC.    Consulted and Agree with Plan of Care  Patient       Patient will benefit from skilled therapeutic intervention in order to improve the following deficits and impairments:  Abnormal gait, Pain, Decreased activity tolerance, Decreased strength, Decreased range of motion, Increased edema  Visit Diagnosis: Stiffness of left knee, not elsewhere classified  Localized edema  Chronic pain of left knee     Problem List Patient Active Problem List   Diagnosis Date Noted  . 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/27/2018  . Unilateral primary osteoarthritis, left knee 11/01/2018  .  Primary osteoarthritis of left knee 11/15/2017  .  Dense breast tissue on mammogram 10/21/2016  . HLD (hyperlipidemia) 10/21/2016  . LVH (left ventricular hypertrophy) 10/21/2016  . Mitral regurgitation 10/21/2016  . Abnormal uterine bleeding (AUB) 10/11/2015  . Hypertension 10/01/2015  . Menorrhagia with regular cycle 06/26/2014  . ADD (attention deficit disorder) 02/18/2012  . Hypothyroidism 12/30/2011    Standley Brooking, PTA 02/14/2019, 2:29 PM  [facility] [address] [place], [place], [address] Phone: [phone]   Fax:  [phone]  Name: SYNIYAH BOURNE MRN: [id number] Date of Birth: May 04, 1971

## 2019-02-17 ENCOUNTER — Ambulatory Visit: Payer: No Typology Code available for payment source | Admitting: Physical Therapy

## 2019-02-17 ENCOUNTER — Other Ambulatory Visit (HOSPITAL_COMMUNITY): Payer: Self-pay | Admitting: Orthopaedic Surgery

## 2019-02-17 DIAGNOSIS — G8929 Other chronic pain: Secondary | ICD-10-CM

## 2019-02-17 DIAGNOSIS — R600 Localized edema: Secondary | ICD-10-CM

## 2019-02-17 DIAGNOSIS — M25562 Pain in left knee: Secondary | ICD-10-CM

## 2019-02-17 DIAGNOSIS — M25662 Stiffness of left knee, not elsewhere classified: Secondary | ICD-10-CM

## 2019-02-17 NOTE — Therapy (Signed)
[facility] [place], [place], [address] Phone: [phone]   Fax:  [phone]  Physical Therapy Treatment  Patient Details  Name: [name] MRN: [id number] Date of Birth: Dec 29, 1970 Referring Provider (PT): Jean Rosenthal MD.   Encounter Date: 02/17/2019  PT End of Session - 02/17/19 0735    Visit Number  10    Number of Visits  12    Date for PT Re-Evaluation  02/27/19    Authorization Type  FOTO AT LEAST EVERY 5TH VISITS.  PROGRESS NOTE AT 10TH VISIT.    PT Start Time  [phone]    PT Stop Time  0824    PT Time Calculation (min)  53 min    Activity Tolerance  Patient tolerated treatment well    Behavior During Therapy  [facility] for tasks assessed/performed       Past Medical History:  Diagnosis Date  . Abnormal uterine bleeding (AUB) 10/11/2015  . ADD (attention deficit disorder)   . Anxiety   . Arthritis   . Chronic headache   . Depression   . Depression with anxiety 12/30/2011  . Edema   . GERD (gastroesophageal reflux disease)   . Heart murmur   . Heavy periods   . HLD (hyperlipidemia) 10/21/2016  . Hypertension   . Hypothyroidism   . Iron deficiency anemia   . Joint pain   . Pneumonia    patient doesnt remember this  . Thyroid nodule    ultrasound 10/13  . Vitamin D deficiency     Past Surgical History:  Procedure Laterality Date  . JOINT REPLACEMENT    . tooth implant  2016  . TOTAL KNEE ARTHROPLASTY Left 12/27/2018  . TOTAL KNEE ARTHROPLASTY Left 12/27/2018   Procedure: LEFT TOTAL KNEE ARTHROPLASTY;  Surgeon: Mcarthur Rossetti, MD;  Location: [place];  Service: Orthopedics;  Laterality: Left;  . TUBAL LIGATION  ~2000  . WISDOM TOOTH EXTRACTION      There were no vitals filed for this visit.  Subjective Assessment - 02/17/19 0735    Subjective  Reports only stiffness. Asked if she could go to her gym and do the recumbant bike.    Pertinent History  Previous left knee injury.    Patient Stated Goals  See above.    Currently in Pain?  No/denies         [facility] PT Assessment - 02/17/19 0001      Assessment   Medical Diagnosis  S/p left total knee replacement.    Referring Provider (PT)  Jean Rosenthal MD.    Onset Date/Surgical Date  12/27/18    Next MD Visit  03/08/2019      Restrictions   Weight Bearing Restrictions  No                   [facility] Adult PT Treatment/Exercise - 02/17/19 0001      Knee/Hip Exercises: Aerobic   Stationary Bike  Seat 8 x15 min      Knee/Hip Exercises: Standing   Heel Raises Limitations  B toe raise x20 reps    Forward Lunges  Left;20 reps    Functional Squat  20 reps;3 seconds    Rocker Board  3 minutes    Other Standing Knee Exercises  [employer] pe[employer] x20 reps for quad strengthening      Knee/Hip Exercises: Supine   Short Arc Quad Sets  Strengthening;Left;3 sets;10 reps      Modalities   Modalities  Vasopneumatic  Vasopneumatic   Number Minutes Vasopneumatic   15 minutes    Vasopnuematic Location   Knee    Vasopneumatic Pressure  Low    Vasopneumatic Temperature   34             PT Education - 02/17/19 0814    Education Details  HEP- SAQ, squats, toe raise, church pews    Person(s) Educated  Patient    Methods  Explanation;Handout;Verbal cues    Comprehension  Verbalized understanding          PT Long Term Goals - 01/25/19 1023      PT LONG TERM GOAL #1   Title  Independent with a HEP.    Time  6    Period  Weeks    Status  On-going      PT LONG TERM GOAL #2   Title  Full left active knee extension in order to normalize gait.    Time  6    Period  Weeks    Status  On-going      PT LONG TERM GOAL #3   Title  Active knee flexion to 115-120 degrees+ so the patient can perform functional tasks and do so with pain not > 2-3/10.    Time  6    Period  Weeks    Status  On-going      PT LONG TERM GOAL #4   Title  Perform a reciprocating stair gait with one railing with pain not  > 2-3/10.    Time  6    Period  Weeks    Status  On-going            Plan - 02/17/19 1660    Clinical Impression Statement  Patient presented in clinic and continues to progress with stationary bike. Patient still lacking proper quad activation and strength which was the main focus of today's treatment. Many new exercises started today that focused on quad with education regarding the rationale for all exercises. New HEP provided for quad strengthening with patient verbalizing understanding of parameters and instructions and also approved for stationary bike at gym followed by [employer]. Normal vasopnuematic response noted following removal of the modality.    Rehab Potential  Excellent    PT Frequency  2x / week    PT Duration  6 weeks    PT Treatment/Interventions  ADLs/Self Care Home Management;Cryotherapy;Occupational psychologist;Therapeutic activities;Therapeutic exercise;Patient/family education;Manual techniques;Vasopneumatic Device;Passive range of motion    PT Next Visit Plan  Continue ROM progression on stationary bike and quad strengthening per POC.    Consulted and Agree with Plan of Care  Patient       Patient will benefit from skilled therapeutic intervention in order to improve the following deficits and impairments:  Abnormal gait, Pain, Decreased activity tolerance, Decreased strength, Decreased range of motion, Increased edema  Visit Diagnosis: Stiffness of left knee, not elsewhere classified  Localized edema  Chronic pain of left knee     Problem List Patient Active Problem List   Diagnosis Date Noted  . 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/27/2018  . Unilateral primary osteoarthritis, left knee 11/01/2018  . Primary osteoarthritis of left knee 11/15/2017  . Dense breast tissue on mammogram 10/21/2016  . HLD (hyperlipidemia) 10/21/2016  . LVH (left ventricular hypertrophy) 10/21/2016  . Mitral regurgitation 10/21/2016  .  Abnormal uterine bleeding (AUB) 10/11/2015  . Hypertension 10/01/2015  . Menorrhagia with regular cycle 06/26/2014  . ADD (attention deficit disorder) 02/18/2012  .  Hypothyroidism 12/30/2011    Standley Brooking, PTA 02/17/2019, 8:45 AM  [facility] [address] [place], [place], [address] Phone: [phone]   Fax:  [phone]  Name: [name] MRN: [id number] Date of Birth: November 03, 1971

## 2019-02-17 NOTE — Telephone Encounter (Signed)
Please advise 

## 2019-02-18 MED FILL — oxyCODONE HCL 5 MG TABS: 5 | 4 days supply | Qty: 40 | Fill #0

## 2019-02-20 ENCOUNTER — Ambulatory Visit: Payer: No Typology Code available for payment source | Admitting: Physical Therapy

## 2019-02-20 DIAGNOSIS — G8929 Other chronic pain: Secondary | ICD-10-CM

## 2019-02-20 DIAGNOSIS — M25562 Pain in left knee: Secondary | ICD-10-CM

## 2019-02-20 DIAGNOSIS — M25662 Stiffness of left knee, not elsewhere classified: Secondary | ICD-10-CM | POA: Diagnosis not present

## 2019-02-20 DIAGNOSIS — R600 Localized edema: Secondary | ICD-10-CM

## 2019-02-20 MED FILL — CYCLOBENZAPRINE 5 MG TABLET: 5 | 10 days supply | Qty: 30 | Fill #1 | Status: TO

## 2019-02-20 NOTE — Therapy (Signed)
[facility] [place], [place], [address] Phone: [phone]   Fax:  [phone]  Physical Therapy Treatment  Patient Details  Name: [name] MRN: [id number] Date of Birth: [birth year] Referring Provider (PT): Jean Rosenthal MD.   Encounter Date: 02/20/2019  PT End of Session - 02/20/19 0744    Visit Number  11    Number of Visits  12    Date for PT Re-Evaluation  02/27/19    Authorization Type  FOTO AT LEAST EVERY 5TH VISITS.  PROGRESS NOTE AT 10TH VISIT.    PT Start Time  [phone]    PT Stop Time  0820    PT Time Calculation (min)  47 min    Activity Tolerance  Patient tolerated treatment well    Behavior During Therapy  [facility] for tasks assessed/performed       Past Medical History:  Diagnosis Date  . Abnormal uterine bleeding (AUB) 10/11/2015  . ADD (attention deficit disorder)   . Anxiety   . Arthritis   . Chronic headache   . Depression   . Depression with anxiety 12/30/2011  . Edema   . GERD (gastroesophageal reflux disease)   . Heart murmur   . Heavy periods   . HLD (hyperlipidemia) 10/21/2016  . Hypertension   . Hypothyroidism   . Iron deficiency anemia   . Joint pain   . Pneumonia    patient doesnt remember this  . Thyroid nodule    ultrasound 10/13  . Vitamin D deficiency     Past Surgical History:  Procedure Laterality Date  . JOINT REPLACEMENT    . tooth implant  2016  . TOTAL KNEE ARTHROPLASTY Left 12/27/2018  . TOTAL KNEE ARTHROPLASTY Left 12/27/2018   Procedure: LEFT TOTAL KNEE ARTHROPLASTY;  Surgeon: Mcarthur Rossetti, MD;  Location: [place];  Service: Orthopedics;  Laterality: Left;  . TUBAL LIGATION  ~2000  . WISDOM TOOTH EXTRACTION      There were no vitals filed for this visit.  Subjective Assessment - 02/20/19 0743    Subjective  Reports that she did not have to take pain pill prior to sleep last night. Did not bring in AD today.    Pertinent History  Previous left knee  injury.    Patient Stated Goals  See above.    Currently in Pain?  Yes    Pain Score  3     Pain Location  Knee    Pain Orientation  Left    Pain Descriptors / Indicators  Discomfort    Pain Type  Surgical pain    Pain Onset  More than a month ago         [facility] PT Assessment - 02/20/19 0001      Assessment   Medical Diagnosis  S/p left total knee replacement.    Referring Provider (PT)  Jean Rosenthal MD.    Onset Date/Surgical Date  12/27/18    Next MD Visit  03/08/2019      Restrictions   Weight Bearing Restrictions  No      AROM   Overall AROM   Deficits    AROM Assessment Site  Knee    Right/Left Knee  Left    Left Knee Extension  8    Left Knee Flexion  104                   [facility] Adult PT Treatment/Exercise - 02/20/19 0001  Knee/Hip Exercises: Aerobic   Stationary Bike  L3, seat 8 x15 min      Knee/Hip Exercises: Standing   Heel Raises Limitations  B toe raise x20 reps    Forward Lunges  Left;20 reps    Functional Squat  20 reps;3 seconds    Rocker Board  3 minutes    Other Standing Knee Exercises  [employer] pe[employer] x20 reps for quad strengthening      Modalities   Modalities  Vasopneumatic      Vasopneumatic   Number Minutes Vasopneumatic   15 minutes    Vasopnuematic Location   Knee    Vasopneumatic Pressure  Low    Vasopneumatic Temperature   63                  PT Long Term Goals - 01/25/19 1023      PT LONG TERM GOAL #1   Title  Independent with a HEP.    Time  6    Period  Weeks    Status  On-going      PT LONG TERM GOAL #2   Title  Full left active knee extension in order to normalize gait.    Time  6    Period  Weeks    Status  On-going      PT LONG TERM GOAL #3   Title  Active knee flexion to 115-120 degrees+ so the patient can perform functional tasks and do so with pain not > 2-3/10.    Time  6    Period  Weeks    Status  On-going      PT LONG TERM GOAL #4   Title  Perform a reciprocating stair gait  with one railing with pain not > 2-3/10.    Time  6    Period  Weeks    Status  On-going            Plan - 02/20/19 0810    Clinical Impression Statement  Patient continues to tolerate therex well with increased edema and discomfort from increased activity over the weekend. Continued quad strengthening focus completed again today with patient reporting more quad contraction and fatigue. AROM of L knee measured as 8-104 deg today in supine. Increased edema noted in superior knee. Normal vasopneumatic response noted following removal of the modalities.     Rehab Potential  Excellent    PT Frequency  2x / week    PT Duration  6 weeks    PT Treatment/Interventions  ADLs/Self Care Home Management;Cryotherapy;Occupational psychologist;Therapeutic activities;Therapeutic exercise;Patient/family education;Manual techniques;Vasopneumatic Device;Passive range of motion    PT Next Visit Plan  Continue ROM progression on stationary bike and quad strengthening per POC.    Consulted and Agree with Plan of Care  Patient       Patient will benefit from skilled therapeutic intervention in order to improve the following deficits and impairments:  Abnormal gait, Pain, Decreased activity tolerance, Decreased strength, Decreased range of motion, Increased edema  Visit Diagnosis: Stiffness of left knee, not elsewhere classified  Localized edema  Chronic pain of left knee     Problem List Patient Active Problem List   Diagnosis Date Noted  . 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/27/2018  . Unilateral primary osteoarthritis, left knee 11/01/2018  . Primary osteoarthritis of left knee 11/15/2017  . Dense breast tissue on mammogram 10/21/2016  . HLD (hyperlipidemia) 10/21/2016  . LVH (left ventricular hypertrophy) 10/21/2016  . Mitral regurgitation 10/21/2016  .  Abnormal uterine bleeding (AUB) 10/11/2015  . Hypertension 10/01/2015  . Menorrhagia with regular cycle  06/26/2014  . ADD (attention deficit disorder) 02/18/2012  . Hypothyroidism 12/30/2011    Standley Brooking, PTA 02/20/2019, 8:25 AM  [facility] [address] [place], [place], [address] Phone: [phone]   Fax:  [phone]  Name: [name] MRN: [id number] Date of Birth: 03-26-1971

## 2019-02-22 ENCOUNTER — Other Ambulatory Visit: Payer: Self-pay

## 2019-02-22 ENCOUNTER — Ambulatory Visit: Payer: No Typology Code available for payment source | Admitting: Physical Therapy

## 2019-02-22 DIAGNOSIS — G8929 Other chronic pain: Secondary | ICD-10-CM

## 2019-02-22 DIAGNOSIS — M25562 Pain in left knee: Secondary | ICD-10-CM

## 2019-02-22 DIAGNOSIS — M25662 Stiffness of left knee, not elsewhere classified: Secondary | ICD-10-CM

## 2019-02-22 DIAGNOSIS — R600 Localized edema: Secondary | ICD-10-CM

## 2019-02-22 NOTE — Therapy (Addendum)
[facility] [place], [place], [address] Phone: [phone]   Fax:  [phone]  Physical Therapy Treatment  Patient Details  Name: [name] MRN: [id number] Date of Birth: [birth year] Referring Provider (PT): Jean Rosenthal MD.   Encounter Date: 02/22/2019  PT End of Session - 02/22/19 0739    Visit Number  12    Number of Visits  20    Date for PT Re-Evaluation  03/27/19    Authorization Type  FOTO AT LEAST EVERY 5TH VISITS.  PROGRESS NOTE AT 10TH VISIT.    PT Start Time  0735    PT Stop Time  0826    PT Time Calculation (min)  51 min    Activity Tolerance  Patient tolerated treatment well    Behavior During Therapy  [facility] for tasks assessed/performed       Past Medical History:  Diagnosis Date  . Abnormal uterine bleeding (AUB) 10/11/2015  . ADD (attention deficit disorder)   . Anxiety   . Arthritis   . Chronic headache   . Depression   . Depression with anxiety 12/30/2011  . Edema   . GERD (gastroesophageal reflux disease)   . Heart murmur   . Heavy periods   . HLD (hyperlipidemia) 10/21/2016  . Hypertension   . Hypothyroidism   . Iron deficiency anemia   . Joint pain   . Pneumonia    patient doesnt remember this  . Thyroid nodule    ultrasound 10/13  . Vitamin D deficiency     Past Surgical History:  Procedure Laterality Date  . JOINT REPLACEMENT    . tooth implant  2016  . TOTAL KNEE ARTHROPLASTY Left 12/27/2018  . TOTAL KNEE ARTHROPLASTY Left 12/27/2018   Procedure: LEFT TOTAL KNEE ARTHROPLASTY;  Surgeon: Mcarthur Rossetti, MD;  Location: [place];  Service: Orthopedics;  Laterality: Left;  . TUBAL LIGATION  ~2000  . WISDOM TOOTH EXTRACTION      There were no vitals filed for this visit.  Subjective Assessment - 02/22/19 0736    Subjective  Reports that she thinks she is walking more normally.    Pertinent History  Previous left knee injury.    Patient Stated Goals  See above.     Currently in Pain?  Yes    Pain Score  1     Pain Location  Knee    Pain Orientation  Left    Pain Descriptors / Indicators  Discomfort    Pain Type  Surgical pain    Pain Onset  More than a month ago    Pain Frequency  Intermittent         [facility] PT Assessment - 02/22/19 0001      Assessment   Medical Diagnosis  S/p left total knee replacement.    Referring Provider (PT)  Jean Rosenthal MD.    Onset Date/Surgical Date  12/27/18    Next MD Visit  03/08/2019      Restrictions   Weight Bearing Restrictions  No                   [facility] Adult PT Treatment/Exercise - 02/22/19 0001      Knee/Hip Exercises: Aerobic   Stationary Bike  L3, seat 8 x12 min      Knee/Hip Exercises: Machines for Strengthening   Cybex Leg Press  1.5-2.5 pl, seat 8 x20 reps with toe raise      Knee/Hip Exercises: Standing   Forward  Lunges  Left;20 reps    Forward Lunges Limitations  L reverse lunges x20 reps    Functional Squat  20 reps;3 seconds      Knee/Hip Exercises: Supine   Straight Leg Raises  Strengthening;Left;20 reps      Modalities   Modalities  Vasopneumatic;Electrical Stimulation      Electrical Stimulation   Electrical Stimulation Location  L knee    Electrical Stimulation Action  IFC    Electrical Stimulation Parameters  80-150 hz x15 min    Electrical Stimulation Goals  Pain;Edema      Vasopneumatic   Number Minutes Vasopneumatic   15 minutes    Vasopnuematic Location   Knee    Vasopneumatic Pressure  Low    Vasopneumatic Temperature   67                  PT Long Term Goals - 01/25/19 1023      PT LONG TERM GOAL #1   Title  Independent with a HEP.    Time  6    Period  Weeks    Status  On-going      PT LONG TERM GOAL #2   Title  Full left active knee extension in order to normalize gait.    Time  6    Period  Weeks    Status  On-going      PT LONG TERM GOAL #3   Title  Active knee flexion to 115-120 degrees+ so the patient can  perform functional tasks and do so with pain not > 2-3/10.    Time  6    Period  Weeks    Status  On-going      PT LONG TERM GOAL #4   Title  Perform a reciprocating stair gait with one railing with pain not > 2-3/10.    Time  6    Period  Weeks    Status  On-going            Plan - 02/22/19 1610    Clinical Impression Statement  Patient continues to do well with quad strengthening focus. Patient observed ambulating independently and with improved heel/toe sequence and knee flexion. Patient now ambulating with her dog for exercise but still takes AD due to lumpy and uneven yard. Normal modalities response noted following removal of the modalities.    Rehab Potential  Excellent    PT Frequency  2x / week    PT Duration  6 weeks    PT Treatment/Interventions  ADLs/Self Care Home Management;Cryotherapy;Occupational psychologist;Therapeutic activities;Therapeutic exercise;Patient/family education;Manual techniques;Vasopneumatic Device;Passive range of motion    PT Next Visit Plan  Continue ROM progression on stationary bike and quad strengthening per POC.    Consulted and Agree with Plan of Care  Patient       Patient will benefit from skilled therapeutic intervention in order to improve the following deficits and impairments:  Abnormal gait, Pain, Decreased activity tolerance, Decreased strength, Decreased range of motion, Increased edema  Visit Diagnosis: Stiffness of left knee, not elsewhere classified - Plan: PT plan of care cert/re-cert  Localized edema - Plan: PT plan of care cert/re-cert  Chronic pain of left knee - Plan: PT plan of care cert/re-cert     Problem List Patient Active Problem List   Diagnosis Date Noted  . 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/27/2018  . Unilateral primary osteoarthritis, left knee 11/01/2018  . Primary osteoarthritis of left knee 11/15/2017  . Dense  breast tissue on mammogram 10/21/2016  . HLD  (hyperlipidemia) 10/21/2016  . LVH (left ventricular hypertrophy) 10/21/2016  . Mitral regurgitation 10/21/2016  . Abnormal uterine bleeding (AUB) 10/11/2015  . Hypertension 10/01/2015  . Menorrhagia with regular cycle 06/26/2014  . ADD (attention deficit disorder) 02/18/2012  . Hypothyroidism 12/30/2011    Claudie Revering, PTA 02/22/2019, 3:37 PM  [facility] [address] [place], [place], [address] Phone: [phone]   Fax:  [phone]  Name: [name] MRN: [id number] Date of Birth: Mar 16, 1971

## 2019-02-22 NOTE — Addendum Note (Signed)
Addended by: Yuval Nolet, [place] W on: 02/22/2019 03:40 PM   Modules accepted: Orders

## 2019-02-23 ENCOUNTER — Encounter: Payer: No Typology Code available for payment source | Admitting: Physical Therapy

## 2019-02-27 ENCOUNTER — Other Ambulatory Visit: Payer: Self-pay

## 2019-02-27 ENCOUNTER — Ambulatory Visit: Payer: No Typology Code available for payment source | Admitting: Physical Therapy

## 2019-02-27 DIAGNOSIS — M25662 Stiffness of left knee, not elsewhere classified: Secondary | ICD-10-CM

## 2019-02-27 DIAGNOSIS — M25562 Pain in left knee: Secondary | ICD-10-CM

## 2019-02-27 DIAGNOSIS — G8929 Other chronic pain: Secondary | ICD-10-CM

## 2019-02-27 DIAGNOSIS — R600 Localized edema: Secondary | ICD-10-CM

## 2019-02-27 NOTE — Therapy (Signed)
[facility] [place], [place], [address] Phone: [phone]   Fax:  [phone]  Physical Therapy Treatment  Patient Details  Name: [name] MRN: [id number] Date of Birth: Mar 10, 1971 Referring Provider (PT): Jean Rosenthal MD.   Encounter Date: 02/27/2019  PT End of Session - 02/27/19 0738    Visit Number  13    Number of Visits  20    Date for PT Re-Evaluation  03/27/19    Authorization Type  FOTO AT LEAST EVERY 5TH VISITS.  PROGRESS NOTE AT 10TH VISIT.    PT Start Time  [phone]    PT Stop Time  0830    PT Time Calculation (min)  57 min    Activity Tolerance  Patient tolerated treatment well    Behavior During Therapy  [facility] for tasks assessed/performed       Past Medical History:  Diagnosis Date  . Abnormal uterine bleeding (AUB) 10/11/2015  . ADD (attention deficit disorder)   . Anxiety   . Arthritis   . Chronic headache   . Depression   . Depression with anxiety 12/30/2011  . Edema   . GERD (gastroesophageal reflux disease)   . Heart murmur   . Heavy periods   . HLD (hyperlipidemia) 10/21/2016  . Hypertension   . Hypothyroidism   . Iron deficiency anemia   . Joint pain   . Pneumonia    patient doesnt remember this  . Thyroid nodule    ultrasound 10/13  . Vitamin D deficiency     Past Surgical History:  Procedure Laterality Date  . JOINT REPLACEMENT    . tooth implant  2016  . TOTAL KNEE ARTHROPLASTY Left 12/27/2018  . TOTAL KNEE ARTHROPLASTY Left 12/27/2018   Procedure: LEFT TOTAL KNEE ARTHROPLASTY;  Surgeon: Mcarthur Rossetti, MD;  Location: [place];  Service: Orthopedics;  Laterality: Left;  . TUBAL LIGATION  ~2000  . WISDOM TOOTH EXTRACTION      There were no vitals filed for this visit.  Subjective Assessment - 02/27/19 0736    Subjective  Reports increased pain through entire LLE. Did not do exercises yesterday due to pain. Tried not to take pain medication that was prescribed.     Pertinent History  Previous left knee injury.    Patient Stated Goals  See above.    Currently in Pain?  Other (Comment)   No pain assessment provided        [facility] PT Assessment - 02/27/19 0001      Assessment   Medical Diagnosis  S/p left total knee replacement.    Referring Provider (PT)  Jean Rosenthal MD.    Onset Date/Surgical Date  12/27/18    Next MD Visit  03/08/2019      Restrictions   Weight Bearing Restrictions  No      ROM / Strength   AROM / PROM / Strength  AROM      AROM   Overall AROM   Deficits    AROM Assessment Site  Knee    Right/Left Knee  Left    Left Knee Extension  4    Left Knee Flexion  104                   [facility] Adult PT Treatment/Exercise - 02/27/19 0001      Knee/Hip Exercises: Aerobic   Stationary Bike  L3, seat 7 x11 min      Knee/Hip Exercises: Machines for Strengthening  Cybex Leg Press  2.5 pl, seat 6 x30 reps with toe raise      Knee/Hip Exercises: Standing   Forward Lunges  Left;20 reps    Hip Abduction  AROM;Left;2 sets;10 reps;Knee straight    Wall Squat  20 reps    Rocker Board  3 minutes      Knee/Hip Exercises: Supine   Straight Leg Raises  Strengthening;Left;20 reps      Modalities   Modalities  Vasopneumatic;Electrical Stimulation      Electrical Stimulation   Electrical Stimulation Location  L knee    Electrical Stimulation Action  IFC    Electrical Stimulation Parameters  80-150 hz x15 min    Electrical Stimulation Goals  Pain;Edema      Vasopneumatic   Number Minutes Vasopneumatic   15 minutes    Vasopnuematic Location   Knee    Vasopneumatic Pressure  Medium    Vasopneumatic Temperature   63                  PT Long Term Goals - 01/25/19 1023      PT LONG TERM GOAL #1   Title  Independent with a HEP.    Time  6    Period  Weeks    Status  On-going      PT LONG TERM GOAL #2   Title  Full left active knee extension in order to normalize gait.    Time  6    Period   Weeks    Status  On-going      PT LONG TERM GOAL #3   Title  Active knee flexion to 115-120 degrees+ so the patient can perform functional tasks and do so with pain not > 2-3/10.    Time  6    Period  Weeks    Status  On-going      PT LONG TERM GOAL #4   Title  Perform a reciprocating stair gait with one railing with pain not > 2-3/10.    Time  6    Period  Weeks    Status  On-going            Plan - 02/27/19 0813    Clinical Impression Statement  Patient presented in clinic with reports of greater LLE discomfort yesterday but may have overdone activity this weekend. Patient progressed with more WB strengthening. Improved L SLR technique with minimal extensor lag present. AROM of L knee measured as 4-104 deg. Normal modalities response noted following removal of the modalities.    Rehab Potential  Excellent    PT Frequency  2x / week    PT Duration  6 weeks    PT Treatment/Interventions  ADLs/Self Care Home Management;Cryotherapy;Occupational psychologist;Therapeutic activities;Therapeutic exercise;Patient/family education;Manual techniques;Vasopneumatic Device;Passive range of motion    PT Next Visit Plan  Continue ROM progression on stationary bike and quad strengthening per POC.    Consulted and Agree with Plan of Care  Patient       Patient will benefit from skilled therapeutic intervention in order to improve the following deficits and impairments:  Abnormal gait, Pain, Decreased activity tolerance, Decreased strength, Decreased range of motion, Increased edema  Visit Diagnosis: Stiffness of left knee, not elsewhere classified  Localized edema  Chronic pain of left knee     Problem List Patient Active Problem List   Diagnosis Date Noted  . 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/27/2018  . Unilateral primary osteoarthritis, left knee 11/01/2018  .  Primary osteoarthritis of left knee 11/15/2017  . Dense breast tissue on mammogram  10/21/2016  . HLD (hyperlipidemia) 10/21/2016  . LVH (left ventricular hypertrophy) 10/21/2016  . Mitral regurgitation 10/21/2016  . Abnormal uterine bleeding (AUB) 10/11/2015  . Hypertension 10/01/2015  . Menorrhagia with regular cycle 06/26/2014  . ADD (attention deficit disorder) 02/18/2012  . Hypothyroidism 12/30/2011    Standley Brooking, PTA 02/27/2019, 8:40 AM  [facility] [address] [place], [place], [address] Phone: [phone]   Fax:  [phone]  Name: [name] MRN: [id number] Date of Birth: [birth year]

## 2019-03-01 ENCOUNTER — Ambulatory Visit: Payer: No Typology Code available for payment source | Admitting: Physical Therapy

## 2019-03-01 ENCOUNTER — Other Ambulatory Visit: Payer: Self-pay

## 2019-03-01 DIAGNOSIS — M25662 Stiffness of left knee, not elsewhere classified: Secondary | ICD-10-CM

## 2019-03-01 DIAGNOSIS — G8929 Other chronic pain: Secondary | ICD-10-CM

## 2019-03-01 DIAGNOSIS — M25562 Pain in left knee: Secondary | ICD-10-CM

## 2019-03-01 DIAGNOSIS — R600 Localized edema: Secondary | ICD-10-CM

## 2019-03-01 NOTE — Therapy (Signed)
[facility] [place], [place], [address] Phone: [phone]   Fax:  [phone]  Physical Therapy Treatment  Patient Details  Name: [name] MRN: [id number] Date of Birth: May 16, 1971 Referring Provider (PT): Jean Rosenthal MD.   Encounter Date: 03/01/2019  PT End of Session - 03/01/19 0743    Visit Number  14    Number of Visits  20    Date for PT Re-Evaluation  03/27/19    Authorization Type  FOTO AT LEAST EVERY 5TH VISITS.  PROGRESS NOTE AT 10TH VISIT.    PT Start Time  [phone]    PT Stop Time  0820    PT Time Calculation (min)  47 min    Activity Tolerance  Patient tolerated treatment well    Behavior During Therapy  [facility] for tasks assessed/performed       Past Medical History:  Diagnosis Date  . Abnormal uterine bleeding (AUB) 10/11/2015  . ADD (attention deficit disorder)   . Anxiety   . Arthritis   . Chronic headache   . Depression   . Depression with anxiety 12/30/2011  . Edema   . GERD (gastroesophageal reflux disease)   . Heart murmur   . Heavy periods   . HLD (hyperlipidemia) 10/21/2016  . Hypertension   . Hypothyroidism   . Iron deficiency anemia   . Joint pain   . Pneumonia    patient doesnt remember this  . Thyroid nodule    ultrasound 10/13  . Vitamin D deficiency     Past Surgical History:  Procedure Laterality Date  . JOINT REPLACEMENT    . tooth implant  2016  . TOTAL KNEE ARTHROPLASTY Left 12/27/2018  . TOTAL KNEE ARTHROPLASTY Left 12/27/2018   Procedure: LEFT TOTAL KNEE ARTHROPLASTY;  Surgeon: Mcarthur Rossetti, MD;  Location: [place];  Service: Orthopedics;  Laterality: Left;  . TUBAL LIGATION  ~2000  . WISDOM TOOTH EXTRACTION      There were no vitals filed for this visit.  Subjective Assessment - 03/01/19 0736    Subjective  Reports walking her dog yesterday outside around her home and had rough terrain at times but had L hip pain. Reports taking tylenol at this  time if needed.    Pertinent History  Previous left knee injury.    Patient Stated Goals  See above.    Currently in Pain?  Other (Comment)   No pain assessment provided.        [facility] PT Assessment - 03/01/19 0001      Assessment   Medical Diagnosis  S/p left total knee replacement.    Referring Provider (PT)  Jean Rosenthal MD.    Onset Date/Surgical Date  12/27/18    Next MD Visit  03/08/2019      Restrictions   Weight Bearing Restrictions  No                   [facility] Adult PT Treatment/Exercise - 03/01/19 0001      Knee/Hip Exercises: Aerobic   Stationary Bike  L3, seat 8 x11 min      Knee/Hip Exercises: Machines for Strengthening   Cybex Leg Press  2.5 pl, seat 6 x30 reps with toe raise      Knee/Hip Exercises: Standing   Forward Lunges  Left;20 reps    Forward Step Up  Left;20 reps;Hand Hold: 2;Step Height: 8"    Wall Squat  20 reps      Knee/Hip Exercises:  Supine   Straight Leg Raises  Strengthening;Left;20 reps      Modalities   Modalities  Vasopneumatic;Electrical Stimulation      Electrical Stimulation   Electrical Stimulation Location  L knee    Electrical Stimulation Action  IFC    Electrical Stimulation Parameters  80-150 hz x15 min    Electrical Stimulation Goals  Pain;Edema      Vasopneumatic   Number Minutes Vasopneumatic   15 minutes    Vasopnuematic Location   Knee    Vasopneumatic Pressure  Medium    Vasopneumatic Temperature   66                  PT Long Term Goals - 01/25/19 1023      PT LONG TERM GOAL #1   Title  Independent with a HEP.    Time  6    Period  Weeks    Status  On-going      PT LONG TERM GOAL #2   Title  Full left active knee extension in order to normalize gait.    Time  6    Period  Weeks    Status  On-going      PT LONG TERM GOAL #3   Title  Active knee flexion to 115-120 degrees+ so the patient can perform functional tasks and do so with pain not > 2-3/10.    Time  6    Period   Weeks    Status  On-going      PT LONG TERM GOAL #4   Title  Perform a reciprocating stair gait with one railing with pain not > 2-3/10.    Time  6    Period  Weeks    Status  On-going            Plan - 03/01/19 0347    Clinical Impression Statement  Patient presented in clinic with reports of L hip pain after ambulating around her home. Patient guided through therex without any complaint. Observation of L knee noted as less L knee edema. AROM of L knee assessed as 6-104 deg in supine. Normal vasopneumatic response noted following removal of the modality. Patient denied any LBP but reports that when walking around her home yesterday that it was rough terrain at times in the yard and felt as she may still have compensated.    Rehab Potential  Excellent    PT Frequency  2x / week    PT Duration  6 weeks    PT Treatment/Interventions  ADLs/Self Care Home Management;Cryotherapy;Occupational psychologist;Therapeutic activities;Therapeutic exercise;Patient/family education;Manual techniques;Vasopneumatic Device;Passive range of motion    PT Next Visit Plan  Continue ROM progression on stationary bike and quad strengthening per POC.    Consulted and Agree with Plan of Care  Patient       Patient will benefit from skilled therapeutic intervention in order to improve the following deficits and impairments:  Abnormal gait, Pain, Decreased activity tolerance, Decreased strength, Decreased range of motion, Increased edema  Visit Diagnosis: Stiffness of left knee, not elsewhere classified  Localized edema  Chronic pain of left knee     Problem List Patient Active Problem List   Diagnosis Date Noted  . 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/27/2018  . Unilateral primary osteoarthritis, left knee 11/01/2018  . Primary osteoarthritis of left knee 11/15/2017  . Dense breast tissue on mammogram 10/21/2016  . HLD (hyperlipidemia) 10/21/2016  . LVH (left  ventricular hypertrophy) 10/21/2016  .  Mitral regurgitation 10/21/2016  . Abnormal uterine bleeding (AUB) 10/11/2015  . Hypertension 10/01/2015  . Menorrhagia with regular cycle 06/26/2014  . ADD (attention deficit disorder) 02/18/2012  . Hypothyroidism 12/30/2011    Standley Brooking, PTA 03/01/2019, 9:02 AM  [facility] [address] [place], [place], [address] Phone: [phone]   Fax:  [phone]  Name: AASHIKA CARTA MRN: [id number] Date of Birth: May 27, 1971

## 2019-03-06 ENCOUNTER — Ambulatory Visit: Payer: No Typology Code available for payment source | Admitting: Physical Therapy

## 2019-03-08 ENCOUNTER — Encounter (INDEPENDENT_AMBULATORY_CARE_PROVIDER_SITE_OTHER): Payer: Self-pay | Admitting: Orthopaedic Surgery

## 2019-03-08 ENCOUNTER — Other Ambulatory Visit: Payer: Self-pay

## 2019-03-08 ENCOUNTER — Ambulatory Visit (INDEPENDENT_AMBULATORY_CARE_PROVIDER_SITE_OTHER): Payer: No Typology Code available for payment source | Admitting: Orthopaedic Surgery

## 2019-03-08 DIAGNOSIS — Z96652 Presence of left artificial knee joint: Secondary | ICD-10-CM

## 2019-03-08 MED ORDER — OXYCODONE HCL 5 MG PO CAPS: 5.0000 mg | ORAL_CAPSULE | Freq: Four times a day (QID) | ORAL | 0 refills | Status: DC | PRN

## 2019-03-08 NOTE — Progress Notes (Signed)
The patient is now 65 days status post a left total knee arthroplasty.  She is doing her exercises on her bike.  Physical therapy is unfortunately been canceled as an outpatient due to the coronavirus pandemic.  She is walking without assistive device.  She is very active 48 years old.  On exam she lacks full extension about 30 degrees and I can flex her to about 105 degrees.  Her left knee is slightly warm and slightly swollen but there is no evidence of infection.  It feels ligamentously stable.  She is very satisfied thus far.  Should continue to put herself with a home exercise program.  I did refill her oxycodone because I do feel is appropriate.  I will see her back in 4 weeks for repeat exam but no x-rays are needed.

## 2019-03-09 ENCOUNTER — Encounter: Payer: No Typology Code available for payment source | Admitting: Physical Therapy

## 2019-03-14 ENCOUNTER — Ambulatory Visit: Payer: No Typology Code available for payment source | Admitting: Physical Therapy

## 2019-03-14 ENCOUNTER — Other Ambulatory Visit: Payer: Self-pay

## 2019-03-14 ENCOUNTER — Encounter: Payer: Self-pay | Admitting: Physical Therapy

## 2019-03-14 DIAGNOSIS — M25662 Stiffness of left knee, not elsewhere classified: Secondary | ICD-10-CM

## 2019-03-14 DIAGNOSIS — R600 Localized edema: Secondary | ICD-10-CM

## 2019-03-14 DIAGNOSIS — G8929 Other chronic pain: Secondary | ICD-10-CM

## 2019-03-14 DIAGNOSIS — M25562 Pain in left knee: Secondary | ICD-10-CM

## 2019-03-14 NOTE — Therapy (Signed)
[facility] [place], [place], [address] Phone: [phone]   Fax:  [phone]  Physical Therapy Treatment  Patient Details  Name: [name] MRN: [id number] Date of Birth: 12-26-1970 Referring Provider (PT): Jean Rosenthal MD.   Encounter Date: 03/14/2019  PT End of Session - 03/14/19 0818    Visit Number  15    Number of Visits  20    Date for PT Re-Evaluation  03/27/19    Authorization Type  FOTO AT LEAST EVERY 5TH VISITS.  PROGRESS NOTE AT 10TH VISIT.    PT Start Time  [phone]    PT Stop Time  0915    PT Time Calculation (min)  59 min    Activity Tolerance  Patient tolerated treatment well    Behavior During Therapy  [facility] for tasks assessed/performed       Past Medical History:  Diagnosis Date  . Abnormal uterine bleeding (AUB) 10/11/2015  . ADD (attention deficit disorder)   . Anxiety   . Arthritis   . Chronic headache   . Depression   . Depression with anxiety 12/30/2011  . Edema   . GERD (gastroesophageal reflux disease)   . Heart murmur   . Heavy periods   . HLD (hyperlipidemia) 10/21/2016  . Hypertension   . Hypothyroidism   . Iron deficiency anemia   . Joint pain   . Pneumonia    patient doesnt remember this  . Thyroid nodule    ultrasound 10/13  . Vitamin D deficiency     Past Surgical History:  Procedure Laterality Date  . JOINT REPLACEMENT    . tooth implant  2016  . TOTAL KNEE ARTHROPLASTY Left 12/27/2018  . TOTAL KNEE ARTHROPLASTY Left 12/27/2018   Procedure: LEFT TOTAL KNEE ARTHROPLASTY;  Surgeon: Mcarthur Rossetti, MD;  Location: [place];  Service: Orthopedics;  Laterality: Left;  . TUBAL LIGATION  ~2000  . WISDOM TOOTH EXTRACTION      There were no vitals filed for this visit.  Subjective Assessment - 03/14/19 0817    Subjective  Reports she is still taking her dog on walks and doing lunges and her stationary bike. Reports Dr. Ninfa Linden is okay with her progress, may  still have to manipulate when she goes back in April. COVID 19 screening performed on patient prior to entering building.    Pertinent History  Previous left knee injury.    Patient Stated Goals  See above.    Currently in Pain?  Yes    Pain Score  2     Pain Location  Knee    Pain Orientation  Left    Pain Descriptors / Indicators  Discomfort    Pain Type  Surgical pain    Pain Onset  More than a month ago    Pain Frequency  Intermittent         [facility] PT Assessment - 03/14/19 0001      Assessment   Medical Diagnosis  S/p left total knee replacement.    Referring Provider (PT)  Jean Rosenthal MD.    Onset Date/Surgical Date  12/27/18    Next MD Visit  04/10/2019      Restrictions   Weight Bearing Restrictions  No      AROM   Overall AROM   Deficits    AROM Assessment Site  Knee    Right/Left Knee  Left    Left Knee Extension  7    Left Knee Flexion  105                   [facility] Adult PT Treatment/Exercise - 03/14/19 0001      Knee/Hip Exercises: Aerobic   Stationary Bike  L4, seat 7 x12 min      Knee/Hip Exercises: Machines for Strengthening   Cybex Knee Extension  10# 3x10 reps    Cybex Knee Flexion  30# 3x10 reps    Cybex Leg Press  2.5 pl, seat 6 x30 reps with toe raise      Knee/Hip Exercises: Standing   Forward Step Up  Left;20 reps;Hand Hold: 2;Step Height: 8"    Step Down  Left;20 reps;Hand Hold: 2;Step Height: 4"   Heel dot   Wall Squat  15 reps;3 seconds      Modalities   Modalities  Vasopneumatic;Electrical Stimulation      Electrical Stimulation   Electrical Stimulation Location  L knee    Electrical Stimulation Action  IFC    Electrical Stimulation Parameters  80-150 hz x15 min    Electrical Stimulation Goals  Pain;Edema      Vasopneumatic   Number Minutes Vasopneumatic   15 minutes    Vasopnuematic Location   Knee    Vasopneumatic Pressure  Medium    Vasopneumatic Temperature   62                  PT Long Term  Goals - 01/25/19 1023      PT LONG TERM GOAL #1   Title  Independent with a HEP.    Time  6    Period  Weeks    Status  On-going      PT LONG TERM GOAL #2   Title  Full left active knee extension in order to normalize gait.    Time  6    Period  Weeks    Status  On-going      PT LONG TERM GOAL #3   Title  Active knee flexion to 115-120 degrees+ so the patient can perform functional tasks and do so with pain not > 2-3/10.    Time  6    Period  Weeks    Status  On-going      PT LONG TERM GOAL #4   Title  Perform a reciprocating stair gait with one railing with pain not > 2-3/10.    Time  6    Period  Weeks    Status  On-going            Plan - 03/14/19 0913    Clinical Impression Statement  Patient presented in clinic with reports of low level L knee pain and continued edema with temperature. Dr. Ninfa Linden is aware of edema and temperature but not concerned per patient report. Patient guided through more strengthening via machine or via bodyweight. Patient still demonstrating weakness with L quad. Patient instructed to continue SLRs 2 sets of 10 reps 2-3x/per day along with adding weight to prone hangs with one can in grocery bag at this time. AROM of L knee measured as 7-105 deg. Normal modalities response noted following removal of the modalities.    Rehab Potential  Excellent    PT Frequency  2x / week    PT Duration  6 weeks    PT Treatment/Interventions  ADLs/Self Care Home Management;Cryotherapy;Occupational psychologist;Therapeutic activities;Therapeutic exercise;Patient/family education;Manual techniques;Vasopneumatic Device;Passive range of motion    PT Next Visit Plan  Continue ROM progression on stationary bike  and quad strengthening per POC.    Consulted and Agree with Plan of Care  Patient       Patient will benefit from skilled therapeutic intervention in order to improve the following deficits and impairments:  Abnormal gait, Pain,  Decreased activity tolerance, Decreased strength, Decreased range of motion, Increased edema  Visit Diagnosis: Stiffness of left knee, not elsewhere classified  Localized edema  Chronic pain of left knee     Problem List Patient Active Problem List   Diagnosis Date Noted  . 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/27/2018  . Unilateral primary osteoarthritis, left knee 11/01/2018  . Primary osteoarthritis of left knee 11/15/2017  . Dense breast tissue on mammogram 10/21/2016  . HLD (hyperlipidemia) 10/21/2016  . LVH (left ventricular hypertrophy) 10/21/2016  . Mitral regurgitation 10/21/2016  . Abnormal uterine bleeding (AUB) 10/11/2015  . Hypertension 10/01/2015  . Menorrhagia with regular cycle 06/26/2014  . ADD (attention deficit disorder) 02/18/2012  . Hypothyroidism 12/30/2011    Standley Brooking, PTA 03/14/2019, 9:37 AM  [facility] [address] [place], [place], [address] Phone: [phone]   Fax:  [phone]  Name: [name] MRN: [id number] Date of Birth: 1971-10-17

## 2019-03-15 ENCOUNTER — Ambulatory Visit: Payer: No Typology Code available for payment source | Admitting: Physical Therapy

## 2019-03-16 ENCOUNTER — Ambulatory Visit: Payer: No Typology Code available for payment source | Attending: Orthopaedic Surgery | Admitting: Physical Therapy

## 2019-03-16 ENCOUNTER — Other Ambulatory Visit: Payer: Self-pay

## 2019-03-16 ENCOUNTER — Encounter: Payer: Self-pay | Admitting: Physical Therapy

## 2019-03-16 DIAGNOSIS — G8929 Other chronic pain: Secondary | ICD-10-CM | POA: Insufficient documentation

## 2019-03-16 DIAGNOSIS — R600 Localized edema: Secondary | ICD-10-CM | POA: Insufficient documentation

## 2019-03-16 DIAGNOSIS — M25562 Pain in left knee: Secondary | ICD-10-CM | POA: Insufficient documentation

## 2019-03-16 DIAGNOSIS — M25662 Stiffness of left knee, not elsewhere classified: Secondary | ICD-10-CM | POA: Diagnosis not present

## 2019-03-16 NOTE — Therapy (Signed)
[facility] [place], [place], [address] Phone: [phone]   Fax:  [phone]  Physical Therapy Treatment  Patient Details  Name: [name] MRN: [id number] Date of Birth: 06-Jun-1971 Referring Provider (PT): Jean Rosenthal MD.   Encounter Date: 03/16/2019  PT End of Session - 03/16/19 0828    Visit Number  16    Number of Visits  20    Date for PT Re-Evaluation  03/27/19    Authorization Type  FOTO AT LEAST EVERY 5TH VISITS.  PROGRESS NOTE AT 10TH VISIT.    PT Start Time  0815    PT Stop Time  0914    PT Time Calculation (min)  59 min    Activity Tolerance  Patient tolerated treatment well    Behavior During Therapy  [facility] for tasks assessed/performed       Past Medical History:  Diagnosis Date  . Abnormal uterine bleeding (AUB) 10/11/2015  . ADD (attention deficit disorder)   . Anxiety   . Arthritis   . Chronic headache   . Depression   . Depression with anxiety 12/30/2011  . Edema   . GERD (gastroesophageal reflux disease)   . Heart murmur   . Heavy periods   . HLD (hyperlipidemia) 10/21/2016  . Hypertension   . Hypothyroidism   . Iron deficiency anemia   . Joint pain   . Pneumonia    patient doesnt remember this  . Thyroid nodule    ultrasound 10/13  . Vitamin D deficiency     Past Surgical History:  Procedure Laterality Date  . JOINT REPLACEMENT    . tooth implant  2016  . TOTAL KNEE ARTHROPLASTY Left 12/27/2018  . TOTAL KNEE ARTHROPLASTY Left 12/27/2018   Procedure: LEFT TOTAL KNEE ARTHROPLASTY;  Surgeon: Mcarthur Rossetti, MD;  Location: [facility];  Service: Orthopedics;  Laterality: Left;  . TUBAL LIGATION  ~2000  . WISDOM TOOTH EXTRACTION      There were no vitals filed for this visit.  Subjective Assessment - 03/16/19 0823    Subjective  Reports more discomfort after previous treatment. Reported discomfort behind her patella last night. COVID 19 screening performed on patient  prior to entering building.    Pertinent History  Previous left knee injury.    Patient Stated Goals  See above.    Currently in Pain?  Yes    Pain Score  3     Pain Location  Knee    Pain Orientation  Left    Pain Descriptors / Indicators  Discomfort    Pain Type  Surgical pain    Pain Onset  More than a month ago         [facility] PT Assessment - 03/16/19 0001      Assessment   Medical Diagnosis  S/p left total knee replacement.    Referring Provider (PT)  Jean Rosenthal MD.    Onset Date/Surgical Date  12/27/18    Next MD Visit  04/10/2019      Restrictions   Weight Bearing Restrictions  No                   [facility] Adult PT Treatment/Exercise - 03/16/19 0001      Knee/Hip Exercises: Stretches   Active Hamstring Stretch  Right;5 reps;30 seconds    ITB Stretch  Right;3 reps;30 seconds      Knee/Hip Exercises: Aerobic   Stationary Bike  L4, seat 7 x15 min  Knee/Hip Exercises: Machines for Strengthening   Cybex Knee Extension  10# 3x10 reps    Cybex Knee Flexion  30# 3x10 reps    Cybex Leg Press  2 pl, seat 6 x30 reps with toe raise      Knee/Hip Exercises: Prone   Prone Knee Hang  5 minutes;Weights    Prone Knee Hang Weights (lbs)  2      Modalities   Modalities  Vasopneumatic;Electrical Stimulation      Electrical Stimulation   Electrical Stimulation Location  L knee    Electrical Stimulation Action  IFC    Electrical Stimulation Parameters  80-150 hz x15 min    Electrical Stimulation Goals  Pain;Edema      Vasopneumatic   Number Minutes Vasopneumatic   15 minutes    Vasopnuematic Location   Knee    Vasopneumatic Pressure  Medium    Vasopneumatic Temperature   61                  PT Long Term Goals - 01/25/19 1023      PT LONG TERM GOAL #1   Title  Independent with a HEP.    Time  6    Period  Weeks    Status  On-going      PT LONG TERM GOAL #2   Title  Full left active knee extension in order to normalize gait.     Time  6    Period  Weeks    Status  On-going      PT LONG TERM GOAL #3   Title  Active knee flexion to 115-120 degrees+ so the patient can perform functional tasks and do so with pain not > 2-3/10.    Time  6    Period  Weeks    Status  On-going      PT LONG TERM GOAL #4   Title  Perform a reciprocating stair gait with one railing with pain not > 2-3/10.    Time  6    Period  Weeks    Status  On-going            Plan - 03/16/19 0901    Clinical Impression Statement  Patient presented in clinic with complaints of greater L knee discomfort after increased activity and quad strengthening. Less aggressive strengthening was not completed today to avoid excessive pain. Stretches completed in supine for HS and ITB due to patient indicated pain and discomfort. Prone hang completed in clinic with 2# ankleweight for added stretch. Normal vasopnuematic response noted following removal of the modality.    Rehab Potential  Excellent    PT Frequency  2x / week    PT Duration  6 weeks    PT Treatment/Interventions  ADLs/Self Care Home Management;Cryotherapy;Occupational psychologist;Therapeutic activities;Therapeutic exercise;Patient/family education;Manual techniques;Vasopneumatic Device;Passive range of motion    PT Next Visit Plan  Continue ROM progression on stationary bike and quad strengthening per POC.    Consulted and Agree with Plan of Care  Patient       Patient will benefit from skilled therapeutic intervention in order to improve the following deficits and impairments:  Abnormal gait, Pain, Decreased activity tolerance, Decreased strength, Decreased range of motion, Increased edema  Visit Diagnosis: Stiffness of left knee, not elsewhere classified  Localized edema  Chronic pain of left knee     Problem List Patient Active Problem List   Diagnosis Date Noted  . 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/27/2018  .  Unilateral primary  osteoarthritis, left knee 11/01/2018  . Primary osteoarthritis of left knee 11/15/2017  . Dense breast tissue on mammogram 10/21/2016  . HLD (hyperlipidemia) 10/21/2016  . LVH (left ventricular hypertrophy) 10/21/2016  . Mitral regurgitation 10/21/2016  . Abnormal uterine bleeding (AUB) 10/11/2015  . Hypertension 10/01/2015  . Menorrhagia with regular cycle 06/26/2014  . ADD (attention deficit disorder) 02/18/2012  . Hypothyroidism 12/30/2011    Standley Brooking, PTA 03/16/2019, 9:44 AM  [facility] [address] [place], [place], [address] Phone: [phone]   Fax:  [phone]  Name: JAMEA ROBICHEAUX MRN: [id number] Date of Birth: 05-30-1971

## 2019-03-20 ENCOUNTER — Ambulatory Visit: Payer: No Typology Code available for payment source | Admitting: Physical Therapy

## 2019-03-21 MED FILL — CYCLOBENZAPRINE HCL 5 MG TA: 5 | 10 days supply | Qty: 30 | Fill #0

## 2019-03-21 MED FILL — oxyCODONE HCL 5 MG TABS: 5 | 7 days supply | Qty: 30 | Fill #0

## 2019-03-22 ENCOUNTER — Ambulatory Visit: Payer: No Typology Code available for payment source | Admitting: Physical Therapy

## 2019-03-22 ENCOUNTER — Other Ambulatory Visit: Payer: Self-pay

## 2019-03-22 ENCOUNTER — Encounter: Payer: Self-pay | Admitting: Physical Therapy

## 2019-03-22 DIAGNOSIS — M25662 Stiffness of left knee, not elsewhere classified: Secondary | ICD-10-CM | POA: Diagnosis not present

## 2019-03-22 DIAGNOSIS — M25562 Pain in left knee: Secondary | ICD-10-CM

## 2019-03-22 DIAGNOSIS — R600 Localized edema: Secondary | ICD-10-CM

## 2019-03-22 DIAGNOSIS — G8929 Other chronic pain: Secondary | ICD-10-CM

## 2019-03-22 NOTE — Therapy (Signed)
[facility] [place], [place], [address] Phone: [phone]   Fax:  [phone]  Physical Therapy Treatment  Patient Details  Name: [name] MRN: [id number] Date of Birth: Nov 06, 1971 Referring Provider (PT): Jean Rosenthal MD.   Encounter Date: 03/22/2019  PT End of Session - 03/22/19 0828    Visit Number  17    Number of Visits  20    Date for PT Re-Evaluation  03/27/19    Authorization Type  FOTO AT LEAST EVERY 5TH VISITS.  PROGRESS NOTE AT 10TH VISIT.    PT Start Time  0815    PT Stop Time  0910    PT Time Calculation (min)  55 min    Activity Tolerance  Patient tolerated treatment well    Behavior During Therapy  [facility] for tasks assessed/performed       Past Medical History:  Diagnosis Date  . Abnormal uterine bleeding (AUB) 10/11/2015  . ADD (attention deficit disorder)   . Anxiety   . Arthritis   . Chronic headache   . Depression   . Depression with anxiety 12/30/2011  . Edema   . GERD (gastroesophageal reflux disease)   . Heart murmur   . Heavy periods   . HLD (hyperlipidemia) 10/21/2016  . Hypertension   . Hypothyroidism   . Iron deficiency anemia   . Joint pain   . Pneumonia    patient doesnt remember this  . Thyroid nodule    ultrasound 10/13  . Vitamin D deficiency     Past Surgical History:  Procedure Laterality Date  . JOINT REPLACEMENT    . tooth implant  2016  . TOTAL KNEE ARTHROPLASTY Left 12/27/2018  . TOTAL KNEE ARTHROPLASTY Left 12/27/2018   Procedure: LEFT TOTAL KNEE ARTHROPLASTY;  Surgeon: Mcarthur Rossetti, MD;  Location: [facility];  Service: Orthopedics;  Laterality: Left;  . TUBAL LIGATION  ~2000  . WISDOM TOOTH EXTRACTION      There were no vitals filed for this visit.  Subjective Assessment - 03/22/19 0816    Subjective  Reports that was wrestling with her husband last night and reports some pain in her knee when her foot was caught. Reports that her knee feels  straight to her and has been compliant with HEPs and activity. COVID 19 screening performed on patient prior to entering building.    Pertinent History  Previous left knee injury.    Patient Stated Goals  See above.    Currently in Pain?  Yes    Pain Score  2     Pain Location  Knee    Pain Orientation  Left    Pain Descriptors / Indicators  Discomfort    Pain Type  Surgical pain    Pain Onset  More than a month ago    Pain Frequency  Intermittent         [facility] PT Assessment - 03/22/19 0001      Assessment   Medical Diagnosis  S/p left total knee replacement.    Referring Provider (PT)  Jean Rosenthal MD.    Onset Date/Surgical Date  12/27/18    Next MD Visit  04/10/2019      Restrictions   Weight Bearing Restrictions  No      ROM / Strength   AROM / PROM / Strength  AROM      AROM   Overall AROM   Deficits    AROM Assessment Site  Knee  Right/Left Knee  Left    Left Knee Extension  7    Left Knee Flexion  105                   [facility] Adult PT Treatment/Exercise - 03/22/19 0001      Knee/Hip Exercises: Aerobic   Stationary Bike  L4, seat 7 x12 min      Knee/Hip Exercises: Machines for Strengthening   Cybex Knee Extension  10# 3x10 reps    Cybex Knee Flexion  30# 3x10 reps    Cybex Leg Press  3 pl, seat 6 x30 reps with toe raise      Knee/Hip Exercises: Standing   Terminal Knee Extension  Strengthening;Left;20 reps;Limitations    Terminal Knee Extension Limitations  Pink XTS    Step Down  Left;20 reps;Hand Hold: 2;Step Height: 4"      Knee/Hip Exercises: Supine   Straight Leg Raise with External Rotation  Strengthening;Left;20 reps      Knee/Hip Exercises: Sidelying   Hip ABduction  AROM;Left;20 reps      Modalities   Modalities  Vasopneumatic      Vasopneumatic   Number Minutes Vasopneumatic   15 minutes    Vasopnuematic Location   Knee    Vasopneumatic Pressure  Medium    Vasopneumatic Temperature   34                   PT Long Term Goals - 03/22/19 0901      PT LONG TERM GOAL #1   Title  Independent with a HEP.    Time  6    Period  Weeks    Status  Achieved      PT LONG TERM GOAL #2   Title  Full left active knee extension in order to normalize gait.    Time  6    Period  Weeks    Status  On-going      PT LONG TERM GOAL #3   Title  Active knee flexion to 115-120 degrees+ so the patient can perform functional tasks and do so with pain not > 2-3/10.    Time  6    Period  Weeks    Status  On-going      PT LONG TERM GOAL #4   Title  Perform a reciprocating stair gait with one railing with pain not > 2-3/10.    Time  6    Period  Weeks    Status  Achieved            Plan - 03/22/19 0901    Clinical Impression Statement  Patient presented in clinic with reports of only low level intermittant L knee pain. Patient consciously feels like she has more L knee extension but thinks its limited by the continued edema. Patient able to complete all exercises as directed with no reports of any muscle fatigue. AROM of L knee measured as 7-105 deg today. Patient very compliant with HEP and activity as well as icing. Normal vasopneumatic response noted following removal of the modality.    Rehab Potential  Excellent    PT Frequency  2x / week    PT Duration  6 weeks    PT Treatment/Interventions  ADLs/Self Care Home Management;Cryotherapy;Occupational psychologist;Therapeutic activities;Therapeutic exercise;Patient/family education;Manual techniques;Vasopneumatic Device;Passive range of motion    PT Next Visit Plan  Continue ROM progression on stationary bike and quad strengthening per POC.    Consulted and Agree with Plan  of Care  Patient       Patient will benefit from skilled therapeutic intervention in order to improve the following deficits and impairments:  Abnormal gait, Pain, Decreased activity tolerance, Decreased strength, Decreased range of  motion, Increased edema  Visit Diagnosis: Stiffness of left knee, not elsewhere classified  Localized edema  Chronic pain of left knee     Problem List Patient Active Problem List   Diagnosis Date Noted  . 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/27/2018  . Unilateral primary osteoarthritis, left knee 11/01/2018  . Primary osteoarthritis of left knee 11/15/2017  . Dense breast tissue on mammogram 10/21/2016  . HLD (hyperlipidemia) 10/21/2016  . LVH (left ventricular hypertrophy) 10/21/2016  . Mitral regurgitation 10/21/2016  . Abnormal uterine bleeding (AUB) 10/11/2015  . Hypertension 10/01/2015  . Menorrhagia with regular cycle 06/26/2014  . ADD (attention deficit disorder) 02/18/2012  . Hypothyroidism 12/30/2011    Standley Brooking, PTA 03/22/2019, 9:23 AM  [facility] [address] [place], [place], [address] Phone: [phone]   Fax:  [phone]  Name: [name] MRN: [id number] Date of Birth: 01/02/1971

## 2019-03-29 ENCOUNTER — Encounter: Payer: Self-pay | Admitting: Physical Therapy

## 2019-03-29 ENCOUNTER — Ambulatory Visit: Payer: No Typology Code available for payment source | Admitting: Physical Therapy

## 2019-03-29 ENCOUNTER — Other Ambulatory Visit: Payer: Self-pay

## 2019-03-29 DIAGNOSIS — M25662 Stiffness of left knee, not elsewhere classified: Secondary | ICD-10-CM | POA: Diagnosis not present

## 2019-03-29 DIAGNOSIS — M25562 Pain in left knee: Secondary | ICD-10-CM

## 2019-03-29 DIAGNOSIS — R600 Localized edema: Secondary | ICD-10-CM

## 2019-03-29 DIAGNOSIS — G8929 Other chronic pain: Secondary | ICD-10-CM

## 2019-03-29 NOTE — Therapy (Signed)
[facility] [place], [place], [address] Phone: [phone]   Fax:  [phone]  Physical Therapy Treatment  Patient Details  Name: [name] MRN: [id number] Date of Birth: 1971/01/03 Referring Provider (PT): Jean Rosenthal MD.   Encounter Date: 03/29/2019  PT End of Session - 03/29/19 0825    Visit Number  18    Number of Visits  20    Date for PT Re-Evaluation  03/27/19    Authorization Type  FOTO AT LEAST EVERY 5TH VISITS.  PROGRESS NOTE AT 10TH VISIT.    PT Start Time  [phone]    PT Stop Time  0914    PT Time Calculation (min)  58 min    Activity Tolerance  Patient tolerated treatment well    Behavior During Therapy  [facility] for tasks assessed/performed       Past Medical History:  Diagnosis Date  . Abnormal uterine bleeding (AUB) 10/11/2015  . ADD (attention deficit disorder)   . Anxiety   . Arthritis   . Chronic headache   . Depression   . Depression with anxiety 12/30/2011  . Edema   . GERD (gastroesophageal reflux disease)   . Heart murmur   . Heavy periods   . HLD (hyperlipidemia) 10/21/2016  . Hypertension   . Hypothyroidism   . Iron deficiency anemia   . Joint pain   . Pneumonia    patient doesnt remember this  . Thyroid nodule    ultrasound 10/13  . Vitamin D deficiency     Past Surgical History:  Procedure Laterality Date  . JOINT REPLACEMENT    . tooth implant  2016  . TOTAL KNEE ARTHROPLASTY Left 12/27/2018  . TOTAL KNEE ARTHROPLASTY Left 12/27/2018   Procedure: LEFT TOTAL KNEE ARTHROPLASTY;  Surgeon: Mcarthur Rossetti, MD;  Location: [facility];  Service: Orthopedics;  Laterality: Left;  . TUBAL LIGATION  ~2000  . WISDOM TOOTH EXTRACTION      There were no vitals filed for this visit.  Subjective Assessment - 03/29/19 0817    Subjective  No new complaints upon arrival. COVID 19 screening performed on patient prior to entering building.    Pertinent History  Previous left knee  injury.    Patient Stated Goals  See above.    Currently in Pain?  Other (Comment)   No pain assessment provided        [facility] PT Assessment - 03/29/19 0001      Assessment   Medical Diagnosis  S/p left total knee replacement.    Referring Provider (PT)  Jean Rosenthal MD.    Onset Date/Surgical Date  12/27/18    Next MD Visit  04/10/2019      Restrictions   Weight Bearing Restrictions  No                   [facility] Adult PT Treatment/Exercise - 03/29/19 0001      Knee/Hip Exercises: Stretches   Active Hamstring Stretch  Right;5 reps;30 seconds      Knee/Hip Exercises: Aerobic   Stationary Bike  L4, seat 7 x12 min      Knee/Hip Exercises: Machines for Strengthening   Cybex Knee Extension  20# 3x10 reps    Cybex Knee Flexion  30# 3x10 reps    Cybex Leg Press  3 pl, seat 6 x30 reps with toe raise with ball squeeze      Knee/Hip Exercises: Standing   Terminal Knee Extension  Strengthening;Left;20 reps;Limitations  Terminal Knee Extension Limitations  Orange XTS      Knee/Hip Exercises: Prone   Prone Knee Hang  5 minutes;Weights    Prone Knee Hang Weights (lbs)  3      Modalities   Modalities  Vasopneumatic      Vasopneumatic   Number Minutes Vasopneumatic   15 minutes    Vasopnuematic Location   Knee    Vasopneumatic Pressure  Medium    Vasopneumatic Temperature   77                  PT Long Term Goals - 03/22/19 0901      PT LONG TERM GOAL #1   Title  Independent with a HEP.    Time  6    Period  Weeks    Status  Achieved      PT LONG TERM GOAL #2   Title  Full left active knee extension in order to normalize gait.    Time  6    Period  Weeks    Status  On-going      PT LONG TERM GOAL #3   Title  Active knee flexion to 115-120 degrees+ so the patient can perform functional tasks and do so with pain not > 2-3/10.    Time  6    Period  Weeks    Status  On-going      PT LONG TERM GOAL #4   Title  Perform a reciprocating  stair gait with one railing with pain not > 2-3/10.    Time  6    Period  Weeks    Status  Achieved            Plan - 03/29/19 1101    Clinical Impression Statement  Patient presented in clinic with no current complaints. Patient guided through more strengthening and stretching during prone hang. Patient denied any discomfort during any exercises. Patient remains very compliant with HEP and with walking at her home. Normal  vasopnuematic response noted following removal of the modality.    Rehab Potential  Excellent    PT Frequency  2x / week    PT Duration  6 weeks    PT Treatment/Interventions  ADLs/Self Care Home Management;Cryotherapy;Occupational psychologist;Therapeutic activities;Therapeutic exercise;Patient/family education;Manual techniques;Vasopneumatic Device;Passive range of motion    PT Next Visit Plan  Continue ROM progression on stationary bike and quad strengthening per POC.    Consulted and Agree with Plan of Care  Patient       Patient will benefit from skilled therapeutic intervention in order to improve the following deficits and impairments:  Abnormal gait, Pain, Decreased activity tolerance, Decreased strength, Decreased range of motion, Increased edema  Visit Diagnosis: Stiffness of left knee, not elsewhere classified  Localized edema  Chronic pain of left knee     Problem List Patient Active Problem List   Diagnosis Date Noted  . 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/27/2018  . Unilateral primary osteoarthritis, left knee 11/01/2018  . Primary osteoarthritis of left knee 11/15/2017  . Dense breast tissue on mammogram 10/21/2016  . HLD (hyperlipidemia) 10/21/2016  . LVH (left ventricular hypertrophy) 10/21/2016  . Mitral regurgitation 10/21/2016  . Abnormal uterine bleeding (AUB) 10/11/2015  . Hypertension 10/01/2015  . Menorrhagia with regular cycle 06/26/2014  . ADD (attention deficit disorder) 02/18/2012  .  Hypothyroidism 12/30/2011    Standley Brooking, PTA 03/29/2019, 11:06 AM  [facility] [place],  [place], [address] Phone: [phone]   Fax:  [phone]  Name: DIMITRIA KETCHUM MRN: [id number] Date of Birth: 1971-08-13

## 2019-04-05 ENCOUNTER — Other Ambulatory Visit: Payer: Self-pay

## 2019-04-05 ENCOUNTER — Encounter: Payer: Self-pay | Admitting: Physical Therapy

## 2019-04-05 ENCOUNTER — Ambulatory Visit: Payer: No Typology Code available for payment source | Admitting: Physical Therapy

## 2019-04-05 DIAGNOSIS — M25662 Stiffness of left knee, not elsewhere classified: Secondary | ICD-10-CM

## 2019-04-05 DIAGNOSIS — G8929 Other chronic pain: Secondary | ICD-10-CM

## 2019-04-05 DIAGNOSIS — R600 Localized edema: Secondary | ICD-10-CM

## 2019-04-05 DIAGNOSIS — M25562 Pain in left knee: Secondary | ICD-10-CM

## 2019-04-05 NOTE — Therapy (Signed)
[facility] [place], [place], [address] Phone: [phone]   Fax:  [phone]  Physical Therapy Treatment  Patient Details  Name: [name] MRN: [id number] Date of Birth: 11/02/1971 Referring Provider (PT): Jean Rosenthal MD.   Encounter Date: 04/05/2019  PT End of Session - 04/05/19 0826    Visit Number  19    Number of Visits  20    Date for PT Re-Evaluation  03/27/19    Authorization Type  FOTO AT LEAST EVERY 5TH VISITS.  PROGRESS NOTE AT 10TH VISIT.    PT Start Time  0824    PT Stop Time  0922    PT Time Calculation (min)  58 min    Activity Tolerance  Patient tolerated treatment well    Behavior During Therapy  [facility] for tasks assessed/performed       Past Medical History:  Diagnosis Date  . Abnormal uterine bleeding (AUB) 10/11/2015  . ADD (attention deficit disorder)   . Anxiety   . Arthritis   . Chronic headache   . Depression   . Depression with anxiety 12/30/2011  . Edema   . GERD (gastroesophageal reflux disease)   . Heart murmur   . Heavy periods   . HLD (hyperlipidemia) 10/21/2016  . Hypertension   . Hypothyroidism   . Iron deficiency anemia   . Joint pain   . Pneumonia    patient doesnt remember this  . Thyroid nodule    ultrasound 10/13  . Vitamin D deficiency     Past Surgical History:  Procedure Laterality Date  . JOINT REPLACEMENT    . tooth implant  2016  . TOTAL KNEE ARTHROPLASTY Left 12/27/2018  . TOTAL KNEE ARTHROPLASTY Left 12/27/2018   Procedure: LEFT TOTAL KNEE ARTHROPLASTY;  Surgeon: Mcarthur Rossetti, MD;  Location: [place];  Service: Orthopedics;  Laterality: Left;  . TUBAL LIGATION  ~2000  . WISDOM TOOTH EXTRACTION      There were no vitals filed for this visit.  Subjective Assessment - 04/05/19 0825    Subjective  Reports helping her husband in the cow stalls and had pain from L low back/hip down to LLE.     Pertinent History  Previous left knee injury.     Patient Stated Goals  See above.    Currently in Pain?  Other (Comment)   No pain assessment provided by patient        [facility] PT Assessment - 04/05/19 0001      Assessment   Medical Diagnosis  S/p left total knee replacement.    Referring Provider (PT)  Jean Rosenthal MD.    Onset Date/Surgical Date  12/27/18    Next MD Visit  04/10/2019      Restrictions   Weight Bearing Restrictions  No                   [facility] Adult PT Treatment/Exercise - 04/05/19 0001      Knee/Hip Exercises: Aerobic   Stationary Bike  L4, seat 6 x10 min      Knee/Hip Exercises: Machines for Strengthening   Cybex Knee Extension  20# 3x10 reps    Cybex Knee Flexion  40# 3x10 reps    Cybex Leg Press  3 pl, seat 6 x30 reps with toe raise with ball squeeze      Knee/Hip Exercises: Standing   Functional Squat  20 reps;3 seconds      Knee/Hip Exercises: Supine   Other  Supine Knee/Hip Exercises  Wall slides with 5#    104 deg-110 deg measured during the exercise     Modalities   Modalities  Electrical Stimulation;Vasopneumatic      Electrical Stimulation   Electrical Stimulation Location  L knee    Electrical Stimulation Action  IFC    Electrical Stimulation Parameters  80-150 hz x15 min    Electrical Stimulation Goals  Edema      Vasopneumatic   Number Minutes Vasopneumatic   15 minutes    Vasopnuematic Location   Knee    Vasopneumatic Pressure  Medium    Vasopneumatic Temperature   68                  PT Long Term Goals - 03/22/19 0901      PT LONG TERM GOAL #1   Title  Independent with a HEP.    Time  6    Period  Weeks    Status  Achieved      PT LONG TERM GOAL #2   Title  Full left active knee extension in order to normalize gait.    Time  6    Period  Weeks    Status  On-going      PT LONG TERM GOAL #3   Title  Active knee flexion to 115-120 degrees+ so the patient can perform functional tasks and do so with pain not > 2-3/10.    Time  6     Period  Weeks    Status  On-going      PT LONG TERM GOAL #4   Title  Perform a reciprocating stair gait with one railing with pain not > 2-3/10.    Time  6    Period  Weeks    Status  Achieved            Plan - 04/05/19 0943    Clinical Impression Statement  Patient presented in clinic with reports of remaining active and intermittant bouts of increased pain from activities. Patient able complete all therex and was progressed to wall slides. 5# ankle weight added to wall slides to progress ROM. 104-110 deg measured during supine wall slides. Prolonged holds at end range flexion during wall slides also included to increase ROM. Increased VCs and demo provided for squats to improve weightbearing and technique. Edema present predominately along medial L knee. Normal modalities response noted following removal of the modalities.    Rehab Potential  Excellent    PT Frequency  2x / week    PT Duration  6 weeks    PT Treatment/Interventions  ADLs/Self Care Home Management;Cryotherapy;Occupational psychologist;Therapeutic activities;Therapeutic exercise;Patient/family education;Manual techniques;Vasopneumatic Device;Passive range of motion    PT Next Visit Plan  Continue ROM progression on stationary bike and quad strengthening per POC.    Consulted and Agree with Plan of Care  Patient       Patient will benefit from skilled therapeutic intervention in order to improve the following deficits and impairments:  Abnormal gait, Pain, Decreased activity tolerance, Decreased strength, Decreased range of motion, Increased edema  Visit Diagnosis: Stiffness of left knee, not elsewhere classified  Localized edema  Chronic pain of left knee     Problem List Patient Active Problem List   Diagnosis Date Noted  . 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/27/2018  . Unilateral primary osteoarthritis, left knee 11/01/2018  . Primary osteoarthritis of left knee  11/15/2017  . Dense breast tissue on mammogram 10/21/2016  .  HLD (hyperlipidemia) 10/21/2016  . LVH (left ventricular hypertrophy) 10/21/2016  . Mitral regurgitation 10/21/2016  . Abnormal uterine bleeding (AUB) 10/11/2015  . Hypertension 10/01/2015  . Menorrhagia with regular cycle 06/26/2014  . ADD (attention deficit disorder) 02/18/2012  . Hypothyroidism 12/30/2011    Standley Brooking, PTA 04/05/2019, 9:59 AM  [facility] [address] [place], [place], [address] Phone: [phone]   Fax:  [phone]  Name: [name] MRN: [id number] Date of Birth: 26-Dec-1970

## 2019-04-07 ENCOUNTER — Encounter: Payer: Self-pay | Admitting: Physical Therapy

## 2019-04-07 ENCOUNTER — Other Ambulatory Visit: Payer: Self-pay

## 2019-04-07 ENCOUNTER — Ambulatory Visit: Payer: No Typology Code available for payment source | Admitting: Physical Therapy

## 2019-04-07 DIAGNOSIS — R600 Localized edema: Secondary | ICD-10-CM

## 2019-04-07 DIAGNOSIS — M25662 Stiffness of left knee, not elsewhere classified: Secondary | ICD-10-CM | POA: Diagnosis not present

## 2019-04-07 DIAGNOSIS — G8929 Other chronic pain: Secondary | ICD-10-CM

## 2019-04-07 DIAGNOSIS — M25562 Pain in left knee: Secondary | ICD-10-CM

## 2019-04-07 NOTE — Therapy (Signed)
[facility] [place], [place], [address] Phone: [phone]   Fax:  [phone]  Physical Therapy Treatment  Patient Details  Name: [name] MRN: [id number] Date of Birth: 12-13-1971 Referring Provider (PT): Jean Rosenthal MD.   Encounter Date: 04/07/2019  PT End of Session - 04/07/19 0818    Visit Number  20    Number of Visits  20    Date for PT Re-Evaluation  03/27/19    Authorization Type  FOTO AT LEAST EVERY 5TH VISITS.  PROGRESS NOTE AT 10TH VISIT.    PT Start Time  0814    PT Stop Time  0914    PT Time Calculation (min)  60 min    Activity Tolerance  Patient tolerated treatment well    Behavior During Therapy  [facility] for tasks assessed/performed       Past Medical History:  Diagnosis Date  . Abnormal uterine bleeding (AUB) 10/11/2015  . ADD (attention deficit disorder)   . Anxiety   . Arthritis   . Chronic headache   . Depression   . Depression with anxiety 12/30/2011  . Edema   . GERD (gastroesophageal reflux disease)   . Heart murmur   . Heavy periods   . HLD (hyperlipidemia) 10/21/2016  . Hypertension   . Hypothyroidism   . Iron deficiency anemia   . Joint pain   . Pneumonia    patient doesnt remember this  . Thyroid nodule    ultrasound 10/13  . Vitamin D deficiency     Past Surgical History:  Procedure Laterality Date  . JOINT REPLACEMENT    . tooth implant  2016  . TOTAL KNEE ARTHROPLASTY Left 12/27/2018  . TOTAL KNEE ARTHROPLASTY Left 12/27/2018   Procedure: LEFT TOTAL KNEE ARTHROPLASTY;  Surgeon: Mcarthur Rossetti, MD;  Location: [place];  Service: Orthopedics;  Laterality: Left;  . TUBAL LIGATION  ~2000  . WISDOM TOOTH EXTRACTION      There were no vitals filed for this visit.  Subjective Assessment - 04/07/19 0814    Subjective  Reports minimal soreness but not taking any pain medication. COVID 19 screening performed prior to patient entering the building.  (Pended)      Pertinent History  Previous left knee injury.    Patient Stated Goals  See above.    Currently in Pain?  No/denies  (Pended)          [facility] PT Assessment - 04/07/19 0001      Assessment   Medical Diagnosis  S/p left total knee replacement.    Referring Provider (PT)  Jean Rosenthal MD.    Onset Date/Surgical Date  12/27/18    Next MD Visit  04/10/2019      Restrictions   Weight Bearing Restrictions  No      ROM / Strength   AROM / PROM / Strength  AROM      AROM   Overall AROM   Deficits    AROM Assessment Site  Knee    Right/Left Knee  Left    Left Knee Extension  3    Left Knee Flexion  110                   [facility] Adult PT Treatment/Exercise - 04/07/19 0001      Knee/Hip Exercises: Aerobic   Stationary Bike  L4, seat 6 x10 min      Knee/Hip Exercises: Machines for Strengthening   Cybex Knee Extension  20# 3x10 reps    Cybex Knee Flexion  50# 3x10 reps      Knee/Hip Exercises: Standing   Forward Lunges  Left;3 sets;10 reps;3 seconds      Knee/Hip Exercises: Supine   Other Supine Knee/Hip Exercises  Wall slides with 5#       Modalities   Modalities  Vasopneumatic      Vasopneumatic   Number Minutes Vasopneumatic   15 minutes    Vasopnuematic Location   Knee    Vasopneumatic Pressure  Medium    Vasopneumatic Temperature   34      Manual Therapy   Manual Therapy  Joint mobilization;Soft tissue mobilization;Passive ROM    Joint Mobilization  Grade I-II tibiofemoral joint mobilizations in order to increase ROM    Soft tissue mobilization  L patella mobilizations to promote proper mobility along with STW to L quad to reduce tightness    Passive ROM  PROM of L knee into flexion and extension to progress ROM                  PT Long Term Goals - 03/22/19 0901      PT LONG TERM GOAL #1   Title  Independent with a HEP.    Time  6    Period  Weeks    Status  Achieved      PT LONG TERM GOAL #2   Title  Full left active knee  extension in order to normalize gait.    Time  6    Period  Weeks    Status  On-going      PT LONG TERM GOAL #3   Title  Active knee flexion to 115-120 degrees+ so the patient can perform functional tasks and do so with pain not > 2-3/10.    Time  6    Period  Weeks    Status  On-going      PT LONG TERM GOAL #4   Title  Perform a reciprocating stair gait with one railing with pain not > 2-3/10.    Time  6    Period  Weeks    Status  Achieved            Plan - 04/07/19 0930    Clinical Impression Statement  Patient presented in clinic with only minimal soreness and some increased edema surrounding L knee. Patient remains very active at home and walking a couple miles daily. Focus of today's treatment being of ROM with 3-110 deg following therex and manual therapy. Tibiofemoral mobilizations completed followed by PROM of L knee. No complaints from patient during manual therapy. Normal vasopnuematic response noted following removal of the modalities.    Rehab Potential  Excellent    PT Frequency  2x / week    PT Duration  6 weeks    PT Treatment/Interventions  ADLs/Self Care Home Management;Cryotherapy;Occupational psychologist;Therapeutic activities;Therapeutic exercise;Patient/family education;Manual techniques;Vasopneumatic Device;Passive range of motion    PT Next Visit Plan  Continue ROM progression on stationary bike and quad strengthening per POC.    Consulted and Agree with Plan of Care  Patient       Patient will benefit from skilled therapeutic intervention in order to improve the following deficits and impairments:  Abnormal gait, Pain, Decreased activity tolerance, Decreased strength, Decreased range of motion, Increased edema  Visit Diagnosis: Stiffness of left knee, not elsewhere classified  Localized edema  Chronic pain of left knee     Problem List  Patient Active Problem List   Diagnosis Date Noted  . Status post total left knee  replacement 12/27/2018  . Unilateral primary osteoarthritis, left knee 11/01/2018  . Primary osteoarthritis of left knee 11/15/2017  . Dense breast tissue on mammogram 10/21/2016  . HLD (hyperlipidemia) 10/21/2016  . LVH (left ventricular hypertrophy) 10/21/2016  . Mitral regurgitation 10/21/2016  . Abnormal uterine bleeding (AUB) 10/11/2015  . Hypertension 10/01/2015  . Menorrhagia with regular cycle 06/26/2014  . ADD (attention deficit disorder) 02/18/2012  . Hypothyroidism 12/30/2011    Standley Brooking, PTA 04/07/19 9:41 AM   [facility] [address] [place], [place], [address] Phone: [phone]   Fax:  [phone]  Name: [name] MRN: [id number] Date of Birth: 02/22/1971

## 2019-04-10 ENCOUNTER — Ambulatory Visit (INDEPENDENT_AMBULATORY_CARE_PROVIDER_SITE_OTHER): Payer: No Typology Code available for payment source | Admitting: Orthopaedic Surgery

## 2019-04-10 ENCOUNTER — Other Ambulatory Visit: Payer: Self-pay

## 2019-04-10 ENCOUNTER — Encounter (INDEPENDENT_AMBULATORY_CARE_PROVIDER_SITE_OTHER): Payer: Self-pay | Admitting: Orthopaedic Surgery

## 2019-04-10 DIAGNOSIS — Z96652 Presence of left artificial knee joint: Secondary | ICD-10-CM

## 2019-04-10 MED ORDER — HYDROCODONE-ACETAMINOPHEN 5-325 MG PO TABS: 1.0000 | ORAL_TABLET | Freq: Four times a day (QID) | ORAL | 0 refills | Status: DC | PRN

## 2019-04-10 MED FILL — HYDROCODON-APAP 5-325: 5-325 | 4 days supply | Qty: 30 | Fill #0

## 2019-04-10 NOTE — Progress Notes (Signed)
Office Visit Note   Patient: [name]           Date of Birth: 1971-08-21           MRN: [id number] Visit Date: 04/10/2019              Requested by: Asencion Noble, MD [address] [place], [place] [address] PCP: Asencion Noble, MD   Assessment & Plan: Visit Diagnoses:  1. Status post total left knee replacement     Plan: She will continue to work on range of motion strengthening the knee.  Changed her from oxycodone to Norco for pain she is taking this primarily at night.  Have her follow-up with [place] in 3 months and see what progress she is making.  She will continue to work with therapy for range of motion strengthening.  She will continue to work on scar tissue mobilization.  Follow-Up Instructions: Return in about 3 months (around 07/10/2019).   Orders:  No orders of the defined types were placed in this encounter.  Meds ordered this encounter  Medications  . HYDROcodone-acetaminophen (NORCO) 5-325 MG tablet    Sig: Take 1-2 tablets by mouth every 6 (six) hours as needed for moderate pain. One to two tabs every 4-6 hours for pain    Dispense:  30 tablet    Refill:  0      Procedures: No procedures performed   Clinical Data: No additional findings.   Subjective: Chief Complaint  Patient presents with  . Left Knee - Follow-up    HPI  Lativia returns today status post left total knee arthroplasty 12/27/2018.  She continues to work with therapy on range of motion strengthening the knee.  States knee is still swollen at times.  She feels that she is gaining range of motion and strength in the knee.  She had no fevers chills.  Review of Systems See HPI otherwise negative  Objective: Vital Signs: There were no vitals taken for this visit.  Physical Exam Constitutional:      Appearance: She is not ill-appearing or diaphoretic.  Neurological:     Mental Status: She is oriented to person, place, and time.     Ortho Exam Left knee full extension  flexion to 105 degrees.  No instability valgus varus stressing.  Calf supple nontender.  Surgical incisions healing well with no signs of infection. Specialty Comments:  No specialty comments available.  Imaging: No results found.   PMFS History: Patient Active Problem List   Diagnosis Date Noted  . 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/27/2018  . Unilateral primary osteoarthritis, left knee 11/01/2018  . Primary osteoarthritis of left knee 11/15/2017  . Dense breast tissue on mammogram 10/21/2016  . HLD (hyperlipidemia) 10/21/2016  . LVH (left ventricular hypertrophy) 10/21/2016  . Mitral regurgitation 10/21/2016  . Abnormal uterine bleeding (AUB) 10/11/2015  . Hypertension 10/01/2015  . Menorrhagia with regular cycle 06/26/2014  . ADD (attention deficit disorder) 02/18/2012  . Hypothyroidism 12/30/2011   Past Medical History:  Diagnosis Date  . Abnormal uterine bleeding (AUB) 10/11/2015  . ADD (attention deficit disorder)   . Anxiety   . Arthritis   . Chronic headache   . Depression   . Depression with anxiety 12/30/2011  . Edema   . GERD (gastroesophageal reflux disease)   . Heart murmur   . Heavy periods   . HLD (hyperlipidemia) 10/21/2016  . Hypertension   . Hypothyroidism   . Iron deficiency anemia   .  Joint pain   . Pneumonia    patient doesnt remember this  . Thyroid nodule    ultrasound 10/13  . Vitamin D deficiency     Family History  Problem Relation Age of Onset  . Other Son        hx/o guillian barre  . Hearing loss Father   . Heart disease Maternal Grandmother   . Heart disease Maternal Grandfather   . Stroke Paternal Grandmother   . Stroke Paternal Grandfather   . Cancer Neg Hx   . Diabetes Neg Hx     Past Surgical History:  Procedure Laterality Date  . JOINT REPLACEMENT    . tooth implant  2016  . TOTAL KNEE ARTHROPLASTY Left 12/27/2018  . TOTAL KNEE ARTHROPLASTY Left 12/27/2018   Procedure: LEFT TOTAL KNEE ARTHROPLASTY;  Surgeon:  Mcarthur Rossetti, MD;  Location: [place];  Service: Orthopedics;  Laterality: Left;  . TUBAL LIGATION  ~2000  . WISDOM TOOTH EXTRACTION     Social History   Occupational History  . Occupation: Care Guide    Employer: [employer]    Comment: [employer]  Tobacco Use  . Smoking status: Never Smoker  . Smokeless tobacco: Never Used  Substance and Sexual Activity  . Alcohol use: Yes    Alcohol/week: 2.0 standard drinks    Types: 2 Glasses of wine per week  . Drug use: No  . Sexual activity: Yes    Birth control/protection: Surgical    Comment: tubal

## 2019-04-11 ENCOUNTER — Encounter: Payer: Self-pay | Admitting: Physical Therapy

## 2019-04-11 ENCOUNTER — Ambulatory Visit: Payer: No Typology Code available for payment source | Admitting: Physical Therapy

## 2019-04-11 DIAGNOSIS — M25562 Pain in left knee: Secondary | ICD-10-CM

## 2019-04-11 DIAGNOSIS — R600 Localized edema: Secondary | ICD-10-CM

## 2019-04-11 DIAGNOSIS — G8929 Other chronic pain: Secondary | ICD-10-CM

## 2019-04-11 DIAGNOSIS — M25662 Stiffness of left knee, not elsewhere classified: Secondary | ICD-10-CM | POA: Diagnosis not present

## 2019-04-11 NOTE — Therapy (Signed)
[facility] [place], [place], [address] Phone: [phone]   Fax:  [phone]  Physical Therapy Treatment  Patient Details  Name: [name] MRN: [id number] Date of Birth: December 04, 1971 Referring Provider (PT): Jean Rosenthal MD.   Encounter Date: 04/11/2019  PT End of Session - 04/11/19 0953    Visit Number  21    Number of Visits  26    Date for PT Re-Evaluation  05/01/19    Authorization Type  FOTO AT LEAST EVERY 5TH VISITS.  PROGRESS NOTE AT 10TH VISIT.    PT Start Time  [phone]    PT Stop Time  1042    PT Time Calculation (min)  58 min    Activity Tolerance  Patient tolerated treatment well    Behavior During Therapy  [facility] for tasks assessed/performed       Past Medical History:  Diagnosis Date  . Abnormal uterine bleeding (AUB) 10/11/2015  . ADD (attention deficit disorder)   . Anxiety   . Arthritis   . Chronic headache   . Depression   . Depression with anxiety 12/30/2011  . Edema   . GERD (gastroesophageal reflux disease)   . Heart murmur   . Heavy periods   . HLD (hyperlipidemia) 10/21/2016  . Hypertension   . Hypothyroidism   . Iron deficiency anemia   . Joint pain   . Pneumonia    patient doesnt remember this  . Thyroid nodule    ultrasound 10/13  . Vitamin D deficiency     Past Surgical History:  Procedure Laterality Date  . JOINT REPLACEMENT    . tooth implant  2016  . TOTAL KNEE ARTHROPLASTY Left 12/27/2018  . TOTAL KNEE ARTHROPLASTY Left 12/27/2018   Procedure: LEFT TOTAL KNEE ARTHROPLASTY;  Surgeon: Mcarthur Rossetti, MD;  Location: [place];  Service: Orthopedics;  Laterality: Left;  . TUBAL LIGATION  ~2000  . WISDOM TOOTH EXTRACTION      There were no vitals filed for this visit.  Subjective Assessment - 04/11/19 0944    Subjective  Reports that PA was pleased with her knee and encouraged her to continue exercise and manual therapy to knee. Not to return to MD until  07/10/2019. Had more pain on Sunday for unknown reason. COVID 19 screening performed on patient prior to entering building.    Pertinent History  Previous left knee injury.    Patient Stated Goals  See above.    Currently in Pain?  Other (Comment)   No pain assessment provided by patient        [facility] PT Assessment - 04/11/19 0001      Assessment   Medical Diagnosis  S/p left total knee replacement.    Referring Provider (PT)  Jean Rosenthal MD.    Onset Date/Surgical Date  12/27/18    Next MD Visit  07/10/2019      Restrictions   Weight Bearing Restrictions  No      Observation/Other Assessments-Edema    Edema  Circumferential      Circumferential Edema   Circumferential - Right  46 cm    Circumferential - Left   47.9 cm      ROM / Strength   AROM / PROM / Strength  AROM      AROM   Overall AROM   Deficits    AROM Assessment Site  Knee    Right/Left Knee  Left    Left Knee Extension  2  Left Knee Flexion  110                   [facility] Adult PT Treatment/Exercise - 04/11/19 0001      Knee/Hip Exercises: Aerobic   Stationary Bike  L4, seat 6 x10 min      Knee/Hip Exercises: Machines for Strengthening   Cybex Knee Extension  20# 3x10 reps    Cybex Knee Flexion  50# 3x10 reps    Cybex Leg Press  3 pl, seat 6 x30 reps with toe raise      Knee/Hip Exercises: Supine   Heel Slides  AROM;Left;20 reps;Limitations    Heel Slides Limitations  5# ankle weight      Modalities   Modalities  Vasopneumatic      Vasopneumatic   Number Minutes Vasopneumatic   15 minutes    Vasopnuematic Location   Knee    Vasopneumatic Pressure  Medium    Vasopneumatic Temperature   34      Manual Therapy   Manual Therapy  Soft tissue mobilization;Passive ROM    Soft tissue mobilization  L patella mobilizations to promote proper mobility along with STW to L quad to reduce tightness    Passive ROM  PROM of L knee into flexion and extension to progress ROM                   PT Long Term Goals - 03/22/19 0901      PT LONG TERM GOAL #1   Title  Independent with a HEP.    Time  6    Period  Weeks    Status  Achieved      PT LONG TERM GOAL #2   Title  Full left active knee extension in order to normalize gait.    Time  6    Period  Weeks    Status  On-going      PT LONG TERM GOAL #3   Title  Active knee flexion to 115-120 degrees+ so the patient can perform functional tasks and do so with pain not > 2-3/10.    Time  6    Period  Weeks    Status  On-going      PT LONG TERM GOAL #4   Title  Perform a reciprocating stair gait with one railing with pain not > 2-3/10.    Time  6    Period  Weeks    Status  Achieved            Plan - 04/11/19 1100    Clinical Impression Statement  Patient presented in clinic with good reports from latest MD visit. Patient was instructed with incision mobilizations as well as self edema massage to reduce edema as well for home. ROM and strengthening exercises completed again today to further progress towards goals. Less resistance noted with PROM of L knee flexion today and less strain for AROM L knee flexion. AROM of L knee measured as 2-110 deg. Normal vasopneumatic response noted following removal of the modality    Rehab Potential  Excellent    PT Frequency  2x / week    PT Duration  6 weeks    PT Treatment/Interventions  ADLs/Self Care Home Management;Cryotherapy;Occupational psychologist;Therapeutic activities;Therapeutic exercise;Patient/family education;Manual techniques;Vasopneumatic Device;Passive range of motion    PT Next Visit Plan  Continue ROM progression on stationary bike and quad strengthening per POC.    Consulted and Agree with Plan of Care  Patient  Patient will benefit from skilled therapeutic intervention in order to improve the following deficits and impairments:  Abnormal gait, Pain, Decreased activity tolerance, Decreased strength,  Decreased range of motion, Increased edema  Visit Diagnosis: Stiffness of left knee, not elsewhere classified - Plan: PT plan of care cert/re-cert  Localized edema - Plan: PT plan of care cert/re-cert  Chronic pain of left knee - Plan: PT plan of care cert/re-cert     Problem List Patient Active Problem List   Diagnosis Date Noted  . 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/27/2018  . Unilateral primary osteoarthritis, left knee 11/01/2018  . Primary osteoarthritis of left knee 11/15/2017  . Dense breast tissue on mammogram 10/21/2016  . HLD (hyperlipidemia) 10/21/2016  . LVH (left ventricular hypertrophy) 10/21/2016  . Mitral regurgitation 10/21/2016  . Abnormal uterine bleeding (AUB) 10/11/2015  . Hypertension 10/01/2015  . Menorrhagia with regular cycle 06/26/2014  . ADD (attention deficit disorder) 02/18/2012  . Hypothyroidism 12/30/2011    Claudie Revering PTA  [place], [place], MPT 04/11/2019, 1:09 PM  [facility] [address] [place], [place], [address] Phone: [phone]   Fax:  [phone]  Name: [name] MRN: [id number] Date of Birth: 1971-05-19

## 2019-04-13 ENCOUNTER — Ambulatory Visit: Payer: No Typology Code available for payment source | Admitting: Physical Therapy

## 2019-04-13 ENCOUNTER — Encounter: Payer: Self-pay | Admitting: Physical Therapy

## 2019-04-13 ENCOUNTER — Other Ambulatory Visit: Payer: Self-pay

## 2019-04-13 DIAGNOSIS — R600 Localized edema: Secondary | ICD-10-CM

## 2019-04-13 DIAGNOSIS — M25562 Pain in left knee: Secondary | ICD-10-CM

## 2019-04-13 DIAGNOSIS — M25662 Stiffness of left knee, not elsewhere classified: Secondary | ICD-10-CM | POA: Diagnosis not present

## 2019-04-13 DIAGNOSIS — G8929 Other chronic pain: Secondary | ICD-10-CM

## 2019-04-13 NOTE — Therapy (Signed)
[facility] [place], [place], [address] Phone: [phone]   Fax:  [phone]  Physical Therapy Treatment  Patient Details  Name: [name] MRN: [id number] Date of Birth: [birth year] Referring Provider (PT): Jean Rosenthal MD.   Encounter Date: 04/13/2019  PT End of Session - 04/13/19 0953    Visit Number  22    Number of Visits  26    Date for PT Re-Evaluation  05/01/19    Authorization Type  FOTO AT LEAST EVERY 5TH VISITS.  PROGRESS NOTE AT 10TH VISIT.    PT Start Time  [phone]    PT Stop Time  1048    PT Time Calculation (min)  65 min    Activity Tolerance  Patient tolerated treatment well    Behavior During Therapy  WFL for tasks assessed/performed       Past Medical History:  Diagnosis Date  . Abnormal uterine bleeding (AUB) 10/11/2015  . ADD (attention deficit disorder)   . Anxiety   . Arthritis   . Chronic headache   . Depression   . Depression with anxiety 12/30/2011  . Edema   . GERD (gastroesophageal reflux disease)   . Heart murmur   . Heavy periods   . HLD (hyperlipidemia) 10/21/2016  . Hypertension   . Hypothyroidism   . Iron deficiency anemia   . Joint pain   . Pneumonia    patient doesnt remember this  . Thyroid nodule    ultrasound 10/13  . Vitamin D deficiency     Past Surgical History:  Procedure Laterality Date  . JOINT REPLACEMENT    . tooth implant  2016  . TOTAL KNEE ARTHROPLASTY Left 12/27/2018  . TOTAL KNEE ARTHROPLASTY Left 12/27/2018   Procedure: LEFT TOTAL KNEE ARTHROPLASTY;  Surgeon: Mcarthur Rossetti, MD;  Location: [facility];  Service: Orthopedics;  Laterality: Left;  . TUBAL LIGATION  ~2000  . WISDOM TOOTH EXTRACTION      There were no vitals filed for this visit.  Subjective Assessment - 04/13/19 0948    Subjective  Reports that she notices she has less pain when she is more active. Reports that she is also have been doing edema massage. COVID 19 screening  performed on patient prior to entering building.    Pertinent History  Previous left knee injury.    Patient Stated Goals  See above.    Currently in Pain?  Yes    Pain Score  2     Pain Location  Knee    Pain Orientation  Left    Pain Descriptors / Indicators  Discomfort    Pain Type  Surgical pain    Pain Onset  More than a month ago    Pain Frequency  Intermittent         [facility] PT Assessment - 04/13/19 0001      Assessment   Medical Diagnosis  S/p left total knee replacement.    Referring Provider (PT)  Jean Rosenthal MD.    Onset Date/Surgical Date  12/27/18    Next MD Visit  07/10/2019      Restrictions   Weight Bearing Restrictions  No      AROM   Overall AROM   Deficits    AROM Assessment Site  Knee    Right/Left Knee  Left    Left Knee Extension  2    Left Knee Flexion  112  [facility] Adult PT Treatment/Exercise - 04/13/19 0001      Knee/Hip Exercises: Aerobic   Stationary Bike  L4, seat 6 x10 min      Knee/Hip Exercises: Machines for Strengthening   Cybex Knee Extension  20# 3x10 reps    Cybex Knee Flexion  50# 3x10 reps    Cybex Leg Press  3 pl, seat 6 x30 reps with toe raise      Knee/Hip Exercises: Standing   Functional Squat  20 reps;3 seconds      Knee/Hip Exercises: Seated   Other Seated Knee/Hip Exercises  Seated L knee contract/relax x10 reps 5 sec holds      Knee/Hip Exercises: Supine   Heel Slides  AROM;Left;20 reps;Limitations    Heel Slides Limitations  5# ankle weight      Modalities   Modalities  Vasopneumatic      Vasopneumatic   Number Minutes Vasopneumatic   15 minutes    Vasopnuematic Location   Knee    Vasopneumatic Pressure  Medium    Vasopneumatic Temperature   34      Manual Therapy   Manual Therapy  Passive ROM    Passive ROM  PROM of L knee into flexion and extension to progress ROM                  PT Long Term Goals - 03/22/19 0901      PT LONG TERM GOAL #1   Title   Independent with a HEP.    Time  6    Period  Weeks    Status  Achieved      PT LONG TERM GOAL #2   Title  Full left active knee extension in order to normalize gait.    Time  6    Period  Weeks    Status  On-going      PT LONG TERM GOAL #3   Title  Active knee flexion to 115-120 degrees+ so the patient can perform functional tasks and do so with pain not > 2-3/10.    Time  6    Period  Weeks    Status  On-going      PT LONG TERM GOAL #4   Title  Perform a reciprocating stair gait with one railing with pain not > 2-3/10.    Time  6    Period  Weeks    Status  Achieved            Plan - 04/13/19 1044    Clinical Impression Statement  Patient continues to progress well with ROM following new exercise techniques used. Patient more active as she reports less pain when she is active. Contract/relax initiated today in order to progress ROM. AROM L knee measured as 2-112 deg following contract/relax and PROM. Normal vasopneuamtic response noted following removal of the modality.    Rehab Potential  Excellent    PT Frequency  2x / week    PT Duration  6 weeks    PT Treatment/Interventions  ADLs/Self Care Home Management;Cryotherapy;Occupational psychologist;Therapeutic activities;Therapeutic exercise;Patient/family education;Manual techniques;Vasopneumatic Device;Passive range of motion    PT Next Visit Plan  Continue ROM progression on stationary bike and quad strengthening per POC.    Consulted and Agree with Plan of Care  Patient       Patient will benefit from skilled therapeutic intervention in order to improve the following deficits and impairments:  Abnormal gait, Pain, Decreased activity tolerance, Decreased strength, Decreased range of  motion, Increased edema  Visit Diagnosis: Stiffness of left knee, not elsewhere classified  Localized edema  Chronic pain of left knee     Problem List Patient Active Problem List   Diagnosis Date Noted   . 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/27/2018  . Unilateral primary osteoarthritis, left knee 11/01/2018  . Primary osteoarthritis of left knee 11/15/2017  . Dense breast tissue on mammogram 10/21/2016  . HLD (hyperlipidemia) 10/21/2016  . LVH (left ventricular hypertrophy) 10/21/2016  . Mitral regurgitation 10/21/2016  . Abnormal uterine bleeding (AUB) 10/11/2015  . Hypertension 10/01/2015  . Menorrhagia with regular cycle 06/26/2014  . ADD (attention deficit disorder) 02/18/2012  . Hypothyroidism 12/30/2011    Standley Brooking, PTA 04/13/2019, 11:27 AM  [facility] [address] [place], [place], [address] Phone: [phone]   Fax:  [phone]  Name: [name] MRN: [id number] Date of Birth: Aug 12, 1971

## 2019-04-17 ENCOUNTER — Other Ambulatory Visit: Payer: Self-pay

## 2019-04-17 ENCOUNTER — Ambulatory Visit: Payer: No Typology Code available for payment source | Attending: Orthopaedic Surgery | Admitting: Physical Therapy

## 2019-04-17 ENCOUNTER — Encounter: Payer: Self-pay | Admitting: Physical Therapy

## 2019-04-17 DIAGNOSIS — M25662 Stiffness of left knee, not elsewhere classified: Secondary | ICD-10-CM | POA: Diagnosis present

## 2019-04-17 DIAGNOSIS — M25562 Pain in left knee: Secondary | ICD-10-CM | POA: Diagnosis present

## 2019-04-17 DIAGNOSIS — R600 Localized edema: Secondary | ICD-10-CM | POA: Diagnosis present

## 2019-04-17 DIAGNOSIS — G8929 Other chronic pain: Secondary | ICD-10-CM | POA: Diagnosis present

## 2019-04-17 NOTE — Therapy (Signed)
[facility] [place], [place], [address] Phone: [phone]   Fax:  [phone]  Physical Therapy Treatment  Patient Details  Name: [name] MRN: [id number] Date of Birth: [birth year] Referring Provider (PT): Jean Rosenthal MD.   Encounter Date: 04/17/2019  PT End of Session - 04/17/19 0820    Visit Number  23    Number of Visits  26    Date for PT Re-Evaluation  05/01/19    Authorization Type  FOTO AT LEAST EVERY 5TH VISITS.  PROGRESS NOTE AT 10TH VISIT.    PT Start Time  0815    PT Stop Time  0916    PT Time Calculation (min)  61 min    Activity Tolerance  Patient tolerated treatment well    Behavior During Therapy  [facility] for tasks assessed/performed       Past Medical History:  Diagnosis Date  . Abnormal uterine bleeding (AUB) 10/11/2015  . ADD (attention deficit disorder)   . Anxiety   . Arthritis   . Chronic headache   . Depression   . Depression with anxiety 12/30/2011  . Edema   . GERD (gastroesophageal reflux disease)   . Heart murmur   . Heavy periods   . HLD (hyperlipidemia) 10/21/2016  . Hypertension   . Hypothyroidism   . Iron deficiency anemia   . Joint pain   . Pneumonia    patient doesnt remember this  . Thyroid nodule    ultrasound 10/13  . Vitamin D deficiency     Past Surgical History:  Procedure Laterality Date  . JOINT REPLACEMENT    . tooth implant  2016  . TOTAL KNEE ARTHROPLASTY Left 12/27/2018  . TOTAL KNEE ARTHROPLASTY Left 12/27/2018   Procedure: LEFT TOTAL KNEE ARTHROPLASTY;  Surgeon: Mcarthur Rossetti, MD;  Location: [facility];  Service: Orthopedics;  Laterality: Left;  . TUBAL LIGATION  ~2000  . WISDOM TOOTH EXTRACTION      There were no vitals filed for this visit.  Subjective Assessment - 04/17/19 0818    Subjective  Reports that she notices she has more trouble with ascending stairs with her LLE. Notes more hip circumduction when ascending stairs with LLE.  COVID 19 screening performed on patient prior to entering building.    Pertinent History  Previous left knee injury.    Patient Stated Goals  See above.    Currently in Pain?  Yes    Pain Score  2     Pain Location  Knee    Pain Orientation  Left    Pain Descriptors / Indicators  Discomfort    Pain Type  Surgical pain    Pain Onset  More than a month ago    Pain Frequency  Intermittent         [facility] PT Assessment - 04/17/19 0001      Assessment   Medical Diagnosis  S/p left total knee replacement.    Referring Provider (PT)  Jean Rosenthal MD.    Onset Date/Surgical Date  12/27/18    Next MD Visit  07/10/2019      Restrictions   Weight Bearing Restrictions  No      ROM / Strength   AROM / PROM / Strength  AROM      AROM   Overall AROM   Deficits    AROM Assessment Site  Knee    Right/Left Knee  Left    Left Knee Extension  1  Left Knee Flexion  113                   [facility] Adult PT Treatment/Exercise - 04/17/19 0001      Ambulation/Gait   Stairs  Yes    Stairs Assistance  6: Modified independent (Device/Increase time)    Stair Management Technique  One rail Right;Alternating pattern;Forwards    Number of Stairs  4   x5 RT   Height of Stairs  6.5      Knee/Hip Exercises: Aerobic   Stationary Bike  L4, seat 6 x10 min      Knee/Hip Exercises: Machines for Strengthening   Cybex Knee Extension  20# 3x10 reps    Cybex Knee Flexion  50# 3x10 reps    Cybex Leg Press  3.5 pl, seat 6 x30 reps with toe raise      Knee/Hip Exercises: Standing   Forward Step Up  Left;2 sets;10 reps;Hand Hold: 2;Step Height: 8"    Step Down  Left;20 reps;Hand Hold: 2;Step Height: 4"      Knee/Hip Exercises: Supine   Heel Slides  AROM;Left;20 reps;Limitations    Heel Slides Limitations  5# ankle weight      Modalities   Modalities  Vasopneumatic      Vasopneumatic   Number Minutes Vasopneumatic   15 minutes    Vasopnuematic Location   Knee    Vasopneumatic  Pressure  Medium    Vasopneumatic Temperature   34      Manual Therapy   Manual Therapy  Passive ROM    Passive ROM  PROM of L knee into flexion to progress ROM                  PT Long Term Goals - 03/22/19 0901      PT LONG TERM GOAL #1   Title  Independent with a HEP.    Time  6    Period  Weeks    Status  Achieved      PT LONG TERM GOAL #2   Title  Full left active knee extension in order to normalize gait.    Time  6    Period  Weeks    Status  On-going      PT LONG TERM GOAL #3   Title  Active knee flexion to 115-120 degrees+ so the patient can perform functional tasks and do so with pain not > 2-3/10.    Time  6    Period  Weeks    Status  On-going      PT LONG TERM GOAL #4   Title  Perform a reciprocating stair gait with one railing with pain not > 2-3/10.    Time  6    Period  Weeks    Status  Achieved            Plan - 04/17/19 0912    Clinical Impression Statement  Patient presented in clinic with low level L knee pain with reports of difficulty with ascending stairs. Patient continues to progress with strengthening within the clinic and feels more strong with ADLs. Patient's stair gait is very habitual as patient had dealt with pain with stairs for prolonged time prior to TKR. More isolated step up strengthening completed today as well with no complaint from patient. AROM of L knee measured as 1-113 deg today following PROM and wall slides. Normal vasopnuematic response noted following removal of the modality.    Rehab Potential  Excellent  PT Frequency  2x / week    PT Duration  6 weeks    PT Treatment/Interventions  ADLs/Self Care Home Management;Cryotherapy;Occupational psychologist;Therapeutic activities;Therapeutic exercise;Patient/family education;Manual techniques;Vasopneumatic Device;Passive range of motion    PT Next Visit Plan  Continue ROM progression on stationary bike and quad strengthening per POC.     Consulted and Agree with Plan of Care  Patient       Patient will benefit from skilled therapeutic intervention in order to improve the following deficits and impairments:  Abnormal gait, Pain, Decreased activity tolerance, Decreased strength, Decreased range of motion, Increased edema  Visit Diagnosis: Stiffness of left knee, not elsewhere classified  Localized edema  Chronic pain of left knee     Problem List Patient Active Problem List   Diagnosis Date Noted  . 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/27/2018  . Unilateral primary osteoarthritis, left knee 11/01/2018  . Primary osteoarthritis of left knee 11/15/2017  . Dense breast tissue on mammogram 10/21/2016  . HLD (hyperlipidemia) 10/21/2016  . LVH (left ventricular hypertrophy) 10/21/2016  . Mitral regurgitation 10/21/2016  . Abnormal uterine bleeding (AUB) 10/11/2015  . Hypertension 10/01/2015  . Menorrhagia with regular cycle 06/26/2014  . ADD (attention deficit disorder) 02/18/2012  . Hypothyroidism 12/30/2011    Standley Brooking, PTA 04/17/2019, 9:50 AM  [facility] [address] [place], [place], [address] Phone: [phone]   Fax:  [phone]  Name: [name] MRN: [id number] Date of Birth: [birth year]

## 2019-04-19 ENCOUNTER — Ambulatory Visit: Payer: No Typology Code available for payment source | Admitting: Physical Therapy

## 2019-04-19 ENCOUNTER — Other Ambulatory Visit: Payer: Self-pay

## 2019-04-19 ENCOUNTER — Encounter: Payer: Self-pay | Admitting: Physical Therapy

## 2019-04-19 DIAGNOSIS — M25662 Stiffness of left knee, not elsewhere classified: Secondary | ICD-10-CM

## 2019-04-19 DIAGNOSIS — R600 Localized edema: Secondary | ICD-10-CM

## 2019-04-19 DIAGNOSIS — M25562 Pain in left knee: Secondary | ICD-10-CM

## 2019-04-19 DIAGNOSIS — G8929 Other chronic pain: Secondary | ICD-10-CM

## 2019-04-19 NOTE — Therapy (Signed)
[facility] [place], [place], [address] Phone: [phone]   Fax:  [phone]  Physical Therapy Treatment  Patient Details  Name: [name] MRN: [id number] Date of Birth: 05/07/1971 Referring Provider (PT): Jean Rosenthal MD.   Encounter Date: 04/19/2019  PT End of Session - 04/19/19 1008    Visit Number  24    Number of Visits  26    Date for PT Re-Evaluation  05/01/19    Authorization Type  FOTO AT LEAST EVERY 5TH VISITS.  PROGRESS NOTE AT 10TH VISIT.    PT Start Time  [phone]    PT Stop Time  1050    PT Time Calculation (min)  64 min    Activity Tolerance  Patient tolerated treatment well    Behavior During Therapy  WFL for tasks assessed/performed       Past Medical History:  Diagnosis Date  . Abnormal uterine bleeding (AUB) 10/11/2015  . ADD (attention deficit disorder)   . Anxiety   . Arthritis   . Chronic headache   . Depression   . Depression with anxiety 12/30/2011  . Edema   . GERD (gastroesophageal reflux disease)   . Heart murmur   . Heavy periods   . HLD (hyperlipidemia) 10/21/2016  . Hypertension   . Hypothyroidism   . Iron deficiency anemia   . Joint pain   . Pneumonia    patient doesnt remember this  . Thyroid nodule    ultrasound 10/13  . Vitamin D deficiency     Past Surgical History:  Procedure Laterality Date  . JOINT REPLACEMENT    . tooth implant  2016  . TOTAL KNEE ARTHROPLASTY Left 12/27/2018  . TOTAL KNEE ARTHROPLASTY Left 12/27/2018   Procedure: LEFT TOTAL KNEE ARTHROPLASTY;  Surgeon: Mcarthur Rossetti, MD;  Location: [facility];  Service: Orthopedics;  Laterality: Left;  . TUBAL LIGATION  ~2000  . WISDOM TOOTH EXTRACTION      There were no vitals filed for this visit.  Subjective Assessment - 04/19/19 0949    Subjective  Reports that she is still having difficulty with basement steps and has to really hip circumduct to advance. COVID 19 screening performed on  patient prior to entering building.    Pertinent History  Previous left knee injury.    Patient Stated Goals  See above.    Currently in Pain?  Other (Comment)   No pain assessment provided by patient        [facility] PT Assessment - 04/19/19 0001      Assessment   Medical Diagnosis  S/p left total knee replacement.    Referring Provider (PT)  Jean Rosenthal MD.    Onset Date/Surgical Date  12/27/18    Next MD Visit  07/10/2019      Restrictions   Weight Bearing Restrictions  No                   [facility] Adult PT Treatment/Exercise - 04/19/19 0001      Knee/Hip Exercises: Aerobic   Stationary Bike  L4, seat 6 x10 min      Knee/Hip Exercises: Machines for Strengthening   Cybex Knee Extension  20# 3x10 reps    Cybex Knee Flexion  50# 3x10 reps    Cybex Leg Press  4 pl, seat 8 x30 reps      Knee/Hip Exercises: Standing   Forward Lunges  Left;3 sets;10 reps;3 seconds   off 8" step  Modalities   Modalities  Vasopneumatic;Electrical Stimulation      Electrical Stimulation   Electrical Stimulation Location  L knee    Electrical Stimulation Action  IFC    Electrical Stimulation Parameters  80-150 hz x15 min    Electrical Stimulation Goals  Edema      Vasopneumatic   Number Minutes Vasopneumatic   15 minutes    Vasopnuematic Location   Knee    Vasopneumatic Pressure  Medium    Vasopneumatic Temperature   34      Manual Therapy   Manual Therapy  Myofascial release;Passive ROM    Myofascial Release  IASTW to L superior and lateral knee to reduce adhesions and restrictions limiting ROM    Passive ROM  Passive L quad stretch in prone 4x30 sec                  PT Long Term Goals - 03/22/19 0901      PT LONG TERM GOAL #1   Title  Independent with a HEP.    Time  6    Period  Weeks    Status  Achieved      PT LONG TERM GOAL #2   Title  Full left active knee extension in order to normalize gait.    Time  6    Period  Weeks    Status   On-going      PT LONG TERM GOAL #3   Title  Active knee flexion to 115-120 degrees+ so the patient can perform functional tasks and do so with pain not > 2-3/10.    Time  6    Period  Weeks    Status  On-going      PT LONG TERM GOAL #4   Title  Perform a reciprocating stair gait with one railing with pain not > 2-3/10.    Time  6    Period  Weeks    Status  Achieved            Plan - 04/19/19 1123    Clinical Impression Statement  Patient presented in clinic with reports of continued difficulty in ascending stairs at her basement. Patient unsure of the measurements of height of stairs. IASTW completed today as patient does report tightness and restriction in superior knee. Patient very sensitive to IASTW and contact to lateral L knee. Moderate redness response noted following IASTW. Patient educated that she may be sore to which patient reported she may use OTC pain medication as directed. Prone quad stretch completed passively as well to improve ROM and 113 deg flexion measured following manual therapy. Normal modalities response noted following removal of the modalities.    Rehab Potential  Excellent    PT Frequency  2x / week    PT Duration  6 weeks    PT Treatment/Interventions  ADLs/Self Care Home Management;Cryotherapy;Occupational psychologist;Therapeutic activities;Therapeutic exercise;Patient/family education;Manual techniques;Vasopneumatic Device;Passive range of motion    PT Next Visit Plan  Continue ROM progression on stationary bike and quad strengthening per POC.    Consulted and Agree with Plan of Care  Patient       Patient will benefit from skilled therapeutic intervention in order to improve the following deficits and impairments:  Abnormal gait, Pain, Decreased activity tolerance, Decreased strength, Decreased range of motion, Increased edema  Visit Diagnosis: Stiffness of left knee, not elsewhere classified  Localized edema  Chronic  pain of left knee     Problem List Patient Active  Problem List   Diagnosis Date Noted  . 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/27/2018  . Unilateral primary osteoarthritis, left knee 11/01/2018  . Primary osteoarthritis of left knee 11/15/2017  . Dense breast tissue on mammogram 10/21/2016  . HLD (hyperlipidemia) 10/21/2016  . LVH (left ventricular hypertrophy) 10/21/2016  . Mitral regurgitation 10/21/2016  . Abnormal uterine bleeding (AUB) 10/11/2015  . Hypertension 10/01/2015  . Menorrhagia with regular cycle 06/26/2014  . ADD (attention deficit disorder) 02/18/2012  . Hypothyroidism 12/30/2011    Standley Brooking, PTA 04/19/2019, 11:33 AM  [facility] [address] [place], [place], [address] Phone: [phone]   Fax:  [phone]  Name: [name] MRN: [id number] Date of Birth: Feb 24, 1971

## 2019-04-24 ENCOUNTER — Other Ambulatory Visit: Payer: Self-pay

## 2019-04-24 ENCOUNTER — Ambulatory Visit: Payer: No Typology Code available for payment source | Admitting: Physical Therapy

## 2019-04-24 ENCOUNTER — Encounter: Payer: Self-pay | Admitting: Physical Therapy

## 2019-04-24 DIAGNOSIS — M25662 Stiffness of left knee, not elsewhere classified: Secondary | ICD-10-CM | POA: Diagnosis not present

## 2019-04-24 DIAGNOSIS — G8929 Other chronic pain: Secondary | ICD-10-CM

## 2019-04-24 DIAGNOSIS — M25562 Pain in left knee: Secondary | ICD-10-CM

## 2019-04-24 DIAGNOSIS — R600 Localized edema: Secondary | ICD-10-CM

## 2019-04-24 NOTE — Therapy (Signed)
[facility] [place], [place], [address] Phone: [phone]   Fax:  [phone]  Physical Therapy Treatment  Patient Details  Name: [name] MRN: [id number] Date of Birth: September 05, 1971 Referring Provider (PT): Jean Rosenthal MD.   Encounter Date: 04/24/2019  PT End of Session - 04/24/19 0826    Visit Number  25    Number of Visits  26    Date for PT Re-Evaluation  05/01/19    Authorization Type  FOTO AT LEAST EVERY 5TH VISITS.  PROGRESS NOTE AT 10TH VISIT.    PT Start Time  0815    PT Stop Time  0915    PT Time Calculation (min)  60 min    Activity Tolerance  Patient tolerated treatment well    Behavior During Therapy  WFL for tasks assessed/performed       Past Medical History:  Diagnosis Date  . Abnormal uterine bleeding (AUB) 10/11/2015  . ADD (attention deficit disorder)   . Anxiety   . Arthritis   . Chronic headache   . Depression   . Depression with anxiety 12/30/2011  . Edema   . GERD (gastroesophageal reflux disease)   . Heart murmur   . Heavy periods   . HLD (hyperlipidemia) 10/21/2016  . Hypertension   . Hypothyroidism   . Iron deficiency anemia   . Joint pain   . Pneumonia    patient doesnt remember this  . Thyroid nodule    ultrasound 10/13  . Vitamin D deficiency     Past Surgical History:  Procedure Laterality Date  . JOINT REPLACEMENT    . tooth implant  2016  . TOTAL KNEE ARTHROPLASTY Left 12/27/2018  . TOTAL KNEE ARTHROPLASTY Left 12/27/2018   Procedure: LEFT TOTAL KNEE ARTHROPLASTY;  Surgeon: Mcarthur Rossetti, MD;  Location: [facility];  Service: Orthopedics;  Laterality: Left;  . TUBAL LIGATION  ~2000  . WISDOM TOOTH EXTRACTION      There were no vitals filed for this visit.  Subjective Assessment - 04/24/19 0816    Subjective  Reports that she was on her feet a lot Saturday when she was really stiff but other wise she has felt good over the weekend. Following IASTW  session, patient reported she was able to maneuver her stairs at home better. Reports that at times she is super sore and tender along her knee and tibia. COVID 19 screening performed on patient prior to entering building.    Pertinent History  Previous left knee injury.    Patient Stated Goals  See above.    Currently in Pain?  Yes    Pain Score  1     Pain Location  Knee    Pain Orientation  Left    Pain Descriptors / Indicators  Discomfort    Pain Type  Surgical pain    Pain Onset  More than a month ago         [facility] PT Assessment - 04/24/19 0001      Assessment   Medical Diagnosis  S/p left total knee replacement.    Referring Provider (PT)  Jean Rosenthal MD.    Onset Date/Surgical Date  12/27/18    Next MD Visit  07/10/2019      Restrictions   Weight Bearing Restrictions  No                   [facility] Adult PT Treatment/Exercise - 04/24/19 0001  Knee/Hip Exercises: Aerobic   Stationary Bike  L4, seat 6 x10 min      Knee/Hip Exercises: Machines for Strengthening   Cybex Knee Extension  20# 3x10 reps    Cybex Knee Flexion  50# 3x10 reps    Cybex Leg Press  4 pl, seat 8 x30 reps      Knee/Hip Exercises: Standing   Forward Lunges  Left;2 sets;10 reps;2 seconds    Functional Squat  20 reps;3 seconds   to a low plinth     Modalities   Modalities  Vasopneumatic;Electrical Stimulation      Electrical Stimulation   Electrical Stimulation Location  L knee     Electrical Stimulation Action  IFC    Electrical Stimulation Parameters  80-150 hz x15 min    Electrical Stimulation Goals  Edema      Vasopneumatic   Number Minutes Vasopneumatic   15 minutes    Vasopnuematic Location   Knee    Vasopneumatic Pressure  Medium    Vasopneumatic Temperature   34      Manual Therapy   Manual Therapy  Myofascial release    Myofascial Release  IASTW to L superior and lateral knee to reduce adhesions and restrictions limiting ROM                  PT  Long Term Goals - 03/22/19 0901      PT LONG TERM GOAL #1   Title  Independent with a HEP.    Time  6    Period  Weeks    Status  Achieved      PT LONG TERM GOAL #2   Title  Full left active knee extension in order to normalize gait.    Time  6    Period  Weeks    Status  On-going      PT LONG TERM GOAL #3   Title  Active knee flexion to 115-120 degrees+ so the patient can perform functional tasks and do so with pain not > 2-3/10.    Time  6    Period  Weeks    Status  On-going      PT LONG TERM GOAL #4   Title  Perform a reciprocating stair gait with one railing with pain not > 2-3/10.    Time  6    Period  Weeks    Status  Achieved            Plan - 04/24/19 9983    Clinical Impression Statement  Patient presented in clinic with reports of improvements in regards to pain and stair negotiation. Patient able to tolerate all therex well although limited by R knee pain during forward lunges. IASTW completed to L superior knee but still very tender to L superiolateral knee due to discomfort and numbness from surgery. Normal modalities response noted following removal of the modalities.    Rehab Potential  Excellent    PT Frequency  2x / week    PT Duration  6 weeks    PT Treatment/Interventions  ADLs/Self Care Home Management;Cryotherapy;Occupational psychologist;Therapeutic activities;Therapeutic exercise;Patient/family education;Manual techniques;Vasopneumatic Device;Passive range of motion    PT Next Visit Plan  Continue ROM progression on stationary bike and quad strengthening per POC.    Consulted and Agree with Plan of Care  Patient       Patient will benefit from skilled therapeutic intervention in order to improve the following deficits and impairments:  Abnormal gait, Pain, Decreased  activity tolerance, Decreased strength, Decreased range of motion, Increased edema  Visit Diagnosis: Stiffness of left knee, not elsewhere  classified  Localized edema  Chronic pain of left knee     Problem List Patient Active Problem List   Diagnosis Date Noted  . 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/27/2018  . Unilateral primary osteoarthritis, left knee 11/01/2018  . Primary osteoarthritis of left knee 11/15/2017  . Dense breast tissue on mammogram 10/21/2016  . HLD (hyperlipidemia) 10/21/2016  . LVH (left ventricular hypertrophy) 10/21/2016  . Mitral regurgitation 10/21/2016  . Abnormal uterine bleeding (AUB) 10/11/2015  . Hypertension 10/01/2015  . Menorrhagia with regular cycle 06/26/2014  . ADD (attention deficit disorder) 02/18/2012  . Hypothyroidism 12/30/2011    Standley Brooking, PTA 04/24/2019, 9:49 AM  [facility] [address] [place], [place], [address] Phone: [phone]   Fax:  [phone]  Name: [name] MRN: [id number] Date of Birth: 1971-11-10

## 2019-04-27 ENCOUNTER — Ambulatory Visit: Payer: No Typology Code available for payment source | Admitting: Physical Therapy

## 2019-04-27 ENCOUNTER — Other Ambulatory Visit: Payer: Self-pay

## 2019-04-27 ENCOUNTER — Encounter: Payer: Self-pay | Admitting: Physical Therapy

## 2019-04-27 DIAGNOSIS — R600 Localized edema: Secondary | ICD-10-CM

## 2019-04-27 DIAGNOSIS — M25662 Stiffness of left knee, not elsewhere classified: Secondary | ICD-10-CM

## 2019-04-27 DIAGNOSIS — G8929 Other chronic pain: Secondary | ICD-10-CM

## 2019-04-27 NOTE — Addendum Note (Signed)
Addended by: APPLEGATE, Mali W on: 04/27/2019 01:45 PM   Modules accepted: Orders

## 2019-04-27 NOTE — Therapy (Addendum)
[facility] [place], [place], [address] Phone: [phone]   Fax:  [phone]  Physical Therapy Treatment  Patient Details  Name: [name] MRN: [id number] Date of Birth: August 20, 1971 Referring Provider (PT): Jean Rosenthal MD.   Encounter Date: 04/27/2019  PT End of Session - 04/27/19 1342    Visit Number  26    Number of Visits  30    Date for PT Re-Evaluation  05/15/19    Authorization Type  FOTO AT LEAST EVERY 5TH VISITS.  PROGRESS NOTE AT 10TH VISIT.    Activity Tolerance  Patient tolerated treatment well    Behavior During Therapy  [facility] for tasks assessed/performed       Past Medical History:  Diagnosis Date  . Abnormal uterine bleeding (AUB) 10/11/2015  . ADD (attention deficit disorder)   . Anxiety   . Arthritis   . Chronic headache   . Depression   . Depression with anxiety 12/30/2011  . Edema   . GERD (gastroesophageal reflux disease)   . Heart murmur   . Heavy periods   . HLD (hyperlipidemia) 10/21/2016  . Hypertension   . Hypothyroidism   . Iron deficiency anemia   . Joint pain   . Pneumonia    patient doesnt remember this  . Thyroid nodule    ultrasound 10/13  . Vitamin D deficiency     Past Surgical History:  Procedure Laterality Date  . JOINT REPLACEMENT    . tooth implant  2016  . TOTAL KNEE ARTHROPLASTY Left 12/27/2018  . TOTAL KNEE ARTHROPLASTY Left 12/27/2018   Procedure: LEFT TOTAL KNEE ARTHROPLASTY;  Surgeon: Mcarthur Rossetti, MD;  Location: [facility];  Service: Orthopedics;  Laterality: Left;  . TUBAL LIGATION  ~2000  . WISDOM TOOTH EXTRACTION      There were no vitals filed for this visit.  Subjective Assessment - 04/27/19 0814    Subjective  Requests not to do lunges as she has been having more L buttock pain recently. Reports that she did terrible on her basement steps at home yesterday and had more difficulty in leading with LLE.    Pertinent History  Previous  left knee injury.    Limitations  Reading    Patient Stated Goals  See above.    Currently in Pain?  Yes    Pain Score  1     Pain Location  Knee    Pain Orientation  Left    Pain Descriptors / Indicators  Discomfort    Pain Type  Surgical pain    Pain Onset  More than a month ago         [facility] PT Assessment - 04/27/19 0001      Assessment   Medical Diagnosis  S/p left total knee replacement.    Referring Provider (PT)  Jean Rosenthal MD.    Onset Date/Surgical Date  12/27/18    Next MD Visit  07/10/2019      Restrictions   Weight Bearing Restrictions  No      ROM / Strength   AROM / PROM / Strength  AROM      AROM   Overall AROM   Deficits    AROM Assessment Site  Knee    Right/Left Knee  Left    Left Knee Extension  0    Left Knee Flexion  113                   [facility]  Adult PT Treatment/Exercise - 04/27/19 0001      Knee/Hip Exercises: Aerobic   Stationary Bike  L4, seat 6 x10 min      Knee/Hip Exercises: Machines for Strengthening   Cybex Knee Extension  20# 3x10 reps    Cybex Knee Flexion  50# 3x10 reps    Cybex Leg Press  4 pl, seat 8 x30 reps      Modalities   Modalities  Vasopneumatic      Vasopneumatic   Number Minutes Vasopneumatic   15 minutes    Vasopnuematic Location   Knee    Vasopneumatic Pressure  Medium    Vasopneumatic Temperature   67      Manual Therapy   Manual Therapy  Joint mobilization;Myofascial release;Passive ROM    Joint Mobilization  Grade III-IV tibiofemoral joint mobilizations in order to increase ROM    Myofascial Release  MFR to L quad, ITB to reduce muscle tightness     Passive ROM  Passive L quad stretch in prone 4x30 sec                  PT Long Term Goals - 04/27/19 0859      PT LONG TERM GOAL #1   Title  Independent with a HEP.    Time  6    Period  Weeks    Status  Achieved      PT LONG TERM GOAL #2   Title  Full left active knee extension in order to normalize gait.    Time  6     Period  Weeks    Status  Achieved      PT LONG TERM GOAL #3   Title  Active knee flexion to 115-120 degrees+ so the patient can perform functional tasks and do so with pain not > 2-3/10.    Time  6    Period  Weeks    Status  Not Met      PT LONG TERM GOAL #4   Title  Perform a reciprocating stair gait with one railing with pain not > 2-3/10.    Time  6    Period  Weeks    Status  Partially Met            Plan - 04/27/19 0910    Clinical Impression Statement  Patient presented in clinic with restriction on stair climbing and L knee ROM. Edema still present surrounding the L patella and patient has been standing more. Patient progressed through more manual therapy techniques to improve L knee flexion but measured as 113 deg. Patient reporting more recent inability to ambulate stairs properly with weakness and lack of confidence in LLE. AROM of L knee measured as 0-113 deg. Normal vasopnuematic response noted following removal of the modality.    Rehab Potential  Excellent    PT Frequency  2x / week    PT Duration  6 weeks    PT Treatment/Interventions  ADLs/Self Care Home Management;Cryotherapy;Occupational psychologist;Therapeutic activities;Therapeutic exercise;Patient/family education;Manual techniques;Vasopneumatic Device;Passive range of motion    PT Next Visit Plan  Continue ROM progression on stationary bike and quad strengthening per POC.    Consulted and Agree with Plan of Care  Patient       Patient will benefit from skilled therapeutic intervention in order to improve the following deficits and impairments:  Abnormal gait, Pain, Decreased activity tolerance, Decreased strength, Decreased range of motion, Increased edema  Visit Diagnosis: Stiffness of left knee, not  elsewhere classified - Plan: PT plan of care cert/re-cert  Localized edema - Plan: PT plan of care cert/re-cert  Chronic pain of left knee - Plan: PT plan of care  cert/re-cert     Problem List Patient Active Problem List   Diagnosis Date Noted  . 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/27/2018  . Unilateral primary osteoarthritis, left knee 11/01/2018  . Primary osteoarthritis of left knee 11/15/2017  . Dense breast tissue on mammogram 10/21/2016  . HLD (hyperlipidemia) 10/21/2016  . LVH (left ventricular hypertrophy) 10/21/2016  . Mitral regurgitation 10/21/2016  . Abnormal uterine bleeding (AUB) 10/11/2015  . Hypertension 10/01/2015  . Menorrhagia with regular cycle 06/26/2014  . ADD (attention deficit disorder) 02/18/2012  . Hypothyroidism 12/30/2011    Claudie Revering, PTA 04/27/2019, 1:44 PM  [facility] [address] [place], [place], [address] Phone: [phone]   Fax:  [phone]  Name: MISTEY HOFFERT MRN: [id number] Date of Birth: September 25, 1971

## 2019-05-15 ENCOUNTER — Encounter: Payer: Self-pay | Admitting: Physical Therapy

## 2019-05-15 ENCOUNTER — Other Ambulatory Visit: Payer: Self-pay

## 2019-05-15 ENCOUNTER — Ambulatory Visit: Payer: No Typology Code available for payment source | Attending: Orthopaedic Surgery | Admitting: Physical Therapy

## 2019-05-15 DIAGNOSIS — M25562 Pain in left knee: Secondary | ICD-10-CM | POA: Insufficient documentation

## 2019-05-15 DIAGNOSIS — M25662 Stiffness of left knee, not elsewhere classified: Secondary | ICD-10-CM | POA: Insufficient documentation

## 2019-05-15 DIAGNOSIS — G8929 Other chronic pain: Secondary | ICD-10-CM | POA: Insufficient documentation

## 2019-05-15 DIAGNOSIS — R600 Localized edema: Secondary | ICD-10-CM | POA: Insufficient documentation

## 2019-05-15 MED FILL — LISINOPRIL-HCTZ 10-12.5 MG: 10-12.5 | 30 days supply | Qty: 30 | Fill #2

## 2019-05-15 MED FILL — LEVOTHYROXINE 200 MCG TAB: 200 | 90 days supply | Qty: 90 | Fill #0

## 2019-05-15 NOTE — Therapy (Signed)
[facility] [place], [place], [address] Phone: [phone]   Fax:  [phone]  Physical Therapy Treatment  Patient Details  Name: [name] MRN: [id number] Date of Birth: 06/18/1971 Referring Provider (PT): Jean Rosenthal MD.   Encounter Date: 05/15/2019  PT End of Session - 05/15/19 0723    Visit Number  27    Number of Visits  30    Date for PT Re-Evaluation  05/15/19    Authorization Type  FOTO AT LEAST EVERY 5TH VISITS.  PROGRESS NOTE AT 10TH VISIT.    PT Start Time  0715    PT Stop Time  0823    PT Time Calculation (min)  68 min    Activity Tolerance  Patient tolerated treatment well    Behavior During Therapy  WFL for tasks assessed/performed       Past Medical History:  Diagnosis Date  . Abnormal uterine bleeding (AUB) 10/11/2015  . ADD (attention deficit disorder)   . Anxiety   . Arthritis   . Chronic headache   . Depression   . Depression with anxiety 12/30/2011  . Edema   . GERD (gastroesophageal reflux disease)   . Heart murmur   . Heavy periods   . HLD (hyperlipidemia) 10/21/2016  . Hypertension   . Hypothyroidism   . Iron deficiency anemia   . Joint pain   . Pneumonia    patient doesnt remember this  . Thyroid nodule    ultrasound 10/13  . Vitamin D deficiency     Past Surgical History:  Procedure Laterality Date  . JOINT REPLACEMENT    . tooth implant  2016  . TOTAL KNEE ARTHROPLASTY Left 12/27/2018  . TOTAL KNEE ARTHROPLASTY Left 12/27/2018   Procedure: LEFT TOTAL KNEE ARTHROPLASTY;  Surgeon: Mcarthur Rossetti, MD;  Location: [facility];  Service: Orthopedics;  Laterality: Left;  . TUBAL LIGATION  ~2000  . WISDOM TOOTH EXTRACTION      There were no vitals filed for this visit.  Subjective Assessment - 05/15/19 0718    Subjective  COVID 19 screening performed on patient prior to entering building. Patient reports that over the weekend she and her husband noticed puffiness  in posterior knee. Reports that sometimes she also feels as if knee is very tight. Reports walking around [place] with friend and continues to elevate and ice as well. Continues to have difficulty donning shoes due to lack of knee flexion.    Pertinent History  Previous left knee injury.    Limitations  Reading    Patient Stated Goals  See above.    Currently in Pain?  Other (Comment)   No pain assessment provided by patient        [facility] PT Assessment - 05/15/19 0001      Assessment   Medical Diagnosis  S/p left total knee replacement.    Referring Provider (PT)  Jean Rosenthal MD.    Onset Date/Surgical Date  12/27/18    Next MD Visit  07/10/2019      Restrictions   Weight Bearing Restrictions  No                   [facility] Adult PT Treatment/Exercise - 05/15/19 0001      Knee/Hip Exercises: Aerobic   Stationary Bike  L1, seat 8 x10 min      Knee/Hip Exercises: Machines for Strengthening   Cybex Knee Extension  20# 3x10 reps    Cybex Knee Flexion  50# 3x10 reps    Cybex Leg Press  4 pl, seat 8 x30 reps with 6# ball squeeze      Knee/Hip Exercises: Standing   Functional Squat  20 reps;3 seconds      Knee/Hip Exercises: Seated   Other Seated Knee/Hip Exercises  Seated L knee contract/relax x15 reps 5 sec holds      Knee/Hip Exercises: Supine   Other Supine Knee/Hip Exercises  Wall slides with 5# x30 reps      Modalities   Modalities  Vasopneumatic;Electrical Stimulation      Electrical Stimulation   Electrical Stimulation Location  L knee    Electrical Stimulation Action  IFC    Electrical Stimulation Parameters  80-150 hz x15 min    Electrical Stimulation Goals  Edema      Vasopneumatic   Number Minutes Vasopneumatic   15 minutes    Vasopnuematic Location   Knee    Vasopneumatic Pressure  Medium    Vasopneumatic Temperature   66      Manual Therapy   Manual Therapy  Joint mobilization;Myofascial release;Passive ROM    Joint Mobilization  Grade  III-IV tibiofemoral joint mobilizations in order to increase ROM    Myofascial Release  MFR to L quad, hip adductors distally to reduce muscle tightness     Passive ROM  PROM of L knee into flexion                   PT Long Term Goals - 04/27/19 0859      PT LONG TERM GOAL #1   Title  Independent with a HEP.    Time  6    Period  Weeks    Status  Achieved      PT LONG TERM GOAL #2   Title  Full left active knee extension in order to normalize gait.    Time  6    Period  Weeks    Status  Achieved      PT LONG TERM GOAL #3   Title  Active knee flexion to 115-120 degrees+ so the patient can perform functional tasks and do so with pain not > 2-3/10.    Time  6    Period  Weeks    Status  Not Met      PT LONG TERM GOAL #4   Title  Perform a reciprocating stair gait with one railing with pain not > 2-3/10.    Time  6    Period  Weeks    Status  Partially Met            Plan - 05/15/19 0841    Clinical Impression Statement  Patient continues to present in clinic with reports of edema intermittantly and discomfort. Patient able to complete therex as directed with minimal to moderate limitation secondary to L knee flexion limitation. Contract/relax as well as PROM continued today to progress ROM which has shown slow positive progress. STW completed during PROM to L distal hip adductors and quad to reduce tightness that could be limiting ROM. AROM of L knee assessed as 0-114 deg following therex and manual therapy. Normal modalities response noted following removal of the modalities. Patient heavily encouraged that edema will dissipate eventually and to allow time for complete healing.    Rehab Potential  Excellent    PT Frequency  2x / week    PT Duration  6 weeks    PT Treatment/Interventions  ADLs/Self Care Home Management;Cryotherapy;Electrical Stimulation;Gait training;Stair  training;Therapeutic activities;Therapeutic exercise;Patient/family education;Manual  techniques;Vasopneumatic Device;Passive range of motion    PT Next Visit Plan  Continue ROM progression on stationary bike and quad strengthening per POC.    Consulted and Agree with Plan of Care  Patient       Patient will benefit from skilled therapeutic intervention in order to improve the following deficits and impairments:  Abnormal gait, Pain, Decreased activity tolerance, Decreased strength, Decreased range of motion, Increased edema  Visit Diagnosis: Stiffness of left knee, not elsewhere classified  Localized edema  Chronic pain of left knee     Problem List Patient Active Problem List   Diagnosis Date Noted  . 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/27/2018  . Unilateral primary osteoarthritis, left knee 11/01/2018  . Primary osteoarthritis of left knee 11/15/2017  . Dense breast tissue on mammogram 10/21/2016  . HLD (hyperlipidemia) 10/21/2016  . LVH (left ventricular hypertrophy) 10/21/2016  . Mitral regurgitation 10/21/2016  . Abnormal uterine bleeding (AUB) 10/11/2015  . Hypertension 10/01/2015  . Menorrhagia with regular cycle 06/26/2014  . ADD (attention deficit disorder) 02/18/2012  . Hypothyroidism 12/30/2011    Standley Brooking, PTA 05/15/2019, 8:47 AM  [facility] [address] [place], [place], [address] Phone: [phone]   Fax:  [phone]  Name: [name] MRN: [id number] Date of Birth: 13-Sep-1971

## 2019-05-17 ENCOUNTER — Ambulatory Visit: Payer: No Typology Code available for payment source | Admitting: Physical Therapy

## 2019-05-17 ENCOUNTER — Encounter: Payer: Self-pay | Admitting: Physical Therapy

## 2019-05-17 ENCOUNTER — Other Ambulatory Visit: Payer: Self-pay

## 2019-05-17 DIAGNOSIS — G8929 Other chronic pain: Secondary | ICD-10-CM

## 2019-05-17 DIAGNOSIS — M25662 Stiffness of left knee, not elsewhere classified: Secondary | ICD-10-CM | POA: Diagnosis not present

## 2019-05-17 DIAGNOSIS — R600 Localized edema: Secondary | ICD-10-CM

## 2019-05-17 DIAGNOSIS — M25562 Pain in left knee: Secondary | ICD-10-CM

## 2019-05-17 NOTE — Therapy (Signed)
[facility] [place], [place], [address] Phone: [phone]   Fax:  [phone]  Physical Therapy Treatment  Patient Details  Name: [name] MRN: [id number] Date of Birth: 12-01-1971 Referring Provider (PT): Jean Rosenthal MD.   Encounter Date: 05/17/2019  PT End of Session - 05/17/19 0727    Visit Number  28    Number of Visits  30    Date for PT Re-Evaluation  05/15/19    Authorization Type  FOTO AT LEAST EVERY 5TH VISITS.  PROGRESS NOTE AT 10TH VISIT.    PT Start Time  0718    PT Stop Time  0821    PT Time Calculation (min)  63 min    Activity Tolerance  Patient tolerated treatment well    Behavior During Therapy  [facility] for tasks assessed/performed       Past Medical History:  Diagnosis Date  . Abnormal uterine bleeding (AUB) 10/11/2015  . ADD (attention deficit disorder)   . Anxiety   . Arthritis   . Chronic headache   . Depression   . Depression with anxiety 12/30/2011  . Edema   . GERD (gastroesophageal reflux disease)   . Heart murmur   . Heavy periods   . HLD (hyperlipidemia) 10/21/2016  . Hypertension   . Hypothyroidism   . Iron deficiency anemia   . Joint pain   . Pneumonia    patient doesnt remember this  . Thyroid nodule    ultrasound 10/13  . Vitamin D deficiency     Past Surgical History:  Procedure Laterality Date  . JOINT REPLACEMENT    . tooth implant  2016  . TOTAL KNEE ARTHROPLASTY Left 12/27/2018  . TOTAL KNEE ARTHROPLASTY Left 12/27/2018   Procedure: LEFT TOTAL KNEE ARTHROPLASTY;  Surgeon: Mcarthur Rossetti, MD;  Location: [facility];  Service: Orthopedics;  Laterality: Left;  . TUBAL LIGATION  ~2000  . WISDOM TOOTH EXTRACTION      There were no vitals filed for this visit.  Subjective Assessment - 05/17/19 0718    Subjective  COVID 19 screening performed on patient upon arrival. Patient reports that her knee is doing good today. Showed PTA a picture of her knees  taken approximately two weeks ago in which her knee had turned more red but was gone the next morning.    Pertinent History  Previous left knee injury.    Limitations  Reading    Patient Stated Goals  See above.    Currently in Pain?  Other (Comment)   No pain assessment provided by patient        [facility] PT Assessment - 05/17/19 0001      Assessment   Medical Diagnosis  S/p left total knee replacement.    Referring Provider (PT)  Jean Rosenthal MD.    Onset Date/Surgical Date  12/27/18    Next MD Visit  07/10/2019      Restrictions   Weight Bearing Restrictions  No      AROM   Overall AROM   Deficits    AROM Assessment Site  Knee    Right/Left Knee  Left    Left Knee Flexion  114                   [facility] Adult PT Treatment/Exercise - 05/17/19 0001      Knee/Hip Exercises: Aerobic   Stationary Bike  Seat 6 x10 min      Knee/Hip Exercises: Machines for Strengthening  Cybex Knee Extension  20# 3x10 reps    Cybex Knee Flexion  50# 3x10 reps    Cybex Leg Press  4 pl, seat 8 x30 reps with 6# ball squeeze      Knee/Hip Exercises: Standing   Functional Squat  20 reps;3 seconds      Knee/Hip Exercises: Prone   Contract/Relax to Increase Flexion  x10 reps      Modalities   Modalities  Vasopneumatic;Electrical Stimulation      Electrical Stimulation   Electrical Stimulation Location  L knee    Electrical Stimulation Action  IFC    Electrical Stimulation Parameters  80-150 hz x15 min    Electrical Stimulation Goals  Edema      Vasopneumatic   Number Minutes Vasopneumatic   15 minutes    Vasopnuematic Location   Knee    Vasopneumatic Pressure  Medium    Vasopneumatic Temperature   71      Manual Therapy   Manual Therapy  Passive ROM;Myofascial release    Myofascial Release  MFR to L quad to reduce TPs nad muscle tightness    Passive ROM  PROM of L knee into flexion to improve ROM                  PT Long Term Goals - 04/27/19 0859       PT LONG TERM GOAL #1   Title  Independent with a HEP.    Time  6    Period  Weeks    Status  Achieved      PT LONG TERM GOAL #2   Title  Full left active knee extension in order to normalize gait.    Time  6    Period  Weeks    Status  Achieved      PT LONG TERM GOAL #3   Title  Active knee flexion to 115-120 degrees+ so the patient can perform functional tasks and do so with pain not > 2-3/10.    Time  6    Period  Weeks    Status  Not Met      PT LONG TERM GOAL #4   Title  Perform a reciprocating stair gait with one railing with pain not > 2-3/10.    Time  6    Period  Weeks    Status  Partially Met            Plan - 05/17/19 0086    Clinical Impression Statement  Patient presented in clinic with relatively no complaints regarding L knee today. Patient able to complete all therex well with VCs required for equal WB during squats to low plinth. Contract/relax completed in prone today with discomfort reported with relax into flexion. AROM of L knee flexion measured as 114 deg. Normal modalities response noted following removal of the modalities.    Rehab Potential  Excellent    PT Frequency  2x / week    PT Duration  6 weeks    PT Treatment/Interventions  ADLs/Self Care Home Management;Cryotherapy;Occupational psychologist;Therapeutic activities;Therapeutic exercise;Patient/family education;Manual techniques;Vasopneumatic Device;Passive range of motion    PT Next Visit Plan  Continue ROM progression on stationary bike and quad strengthening per POC.    Consulted and Agree with Plan of Care  Patient       Patient will benefit from skilled therapeutic intervention in order to improve the following deficits and impairments:  Abnormal gait, Pain, Decreased activity tolerance, Decreased strength, Decreased range of  motion, Increased edema  Visit Diagnosis: Stiffness of left knee, not elsewhere classified  Localized edema  Chronic pain of left  knee     Problem List Patient Active Problem List   Diagnosis Date Noted  . 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/27/2018  . Unilateral primary osteoarthritis, left knee 11/01/2018  . Primary osteoarthritis of left knee 11/15/2017  . Dense breast tissue on mammogram 10/21/2016  . HLD (hyperlipidemia) 10/21/2016  . LVH (left ventricular hypertrophy) 10/21/2016  . Mitral regurgitation 10/21/2016  . Abnormal uterine bleeding (AUB) 10/11/2015  . Hypertension 10/01/2015  . Menorrhagia with regular cycle 06/26/2014  . ADD (attention deficit disorder) 02/18/2012  . Hypothyroidism 12/30/2011    Standley Brooking, PTA 05/17/2019, 9:39 AM  [facility] [address] [place], [place], [address] Phone: [phone]   Fax:  [phone]  Name: [name] MRN: [id number] Date of Birth: 05/25/1971

## 2019-05-22 ENCOUNTER — Encounter: Payer: Self-pay | Admitting: Physical Therapy

## 2019-05-22 ENCOUNTER — Other Ambulatory Visit: Payer: Self-pay

## 2019-05-22 ENCOUNTER — Ambulatory Visit: Payer: No Typology Code available for payment source | Admitting: Physical Therapy

## 2019-05-22 DIAGNOSIS — M25662 Stiffness of left knee, not elsewhere classified: Secondary | ICD-10-CM | POA: Diagnosis not present

## 2019-05-22 DIAGNOSIS — G8929 Other chronic pain: Secondary | ICD-10-CM

## 2019-05-22 DIAGNOSIS — R600 Localized edema: Secondary | ICD-10-CM

## 2019-05-22 NOTE — Therapy (Signed)
[facility] [place], [place], [address] Phone: [phone]   Fax:  [phone]  Physical Therapy Treatment  Patient Details  Name: [name] MRN: [id number] Date of Birth: 1971-10-16 Referring Provider (PT): Jean Rosenthal MD.   Encounter Date: 05/22/2019  PT End of Session - 05/22/19 0719    Visit Number  29    Number of Visits  30    Date for PT Re-Evaluation  05/15/19    Authorization Type  FOTO AT LEAST EVERY 5TH VISITS.  PROGRESS NOTE AT 10TH VISIT.    PT Start Time  0718    PT Stop Time  0814  (Pended)     PT Time Calculation (min)  56 min  (Pended)     Activity Tolerance  Patient tolerated treatment well    Behavior During Therapy  [facility] for tasks assessed/performed       Past Medical History:  Diagnosis Date  . Abnormal uterine bleeding (AUB) 10/11/2015  . ADD (attention deficit disorder)   . Anxiety   . Arthritis   . Chronic headache   . Depression   . Depression with anxiety 12/30/2011  . Edema   . GERD (gastroesophageal reflux disease)   . Heart murmur   . Heavy periods   . HLD (hyperlipidemia) 10/21/2016  . Hypertension   . Hypothyroidism   . Iron deficiency anemia   . Joint pain   . Pneumonia    patient doesnt remember this  . Thyroid nodule    ultrasound 10/13  . Vitamin D deficiency     Past Surgical History:  Procedure Laterality Date  . JOINT REPLACEMENT    . tooth implant  2016  . TOTAL KNEE ARTHROPLASTY Left 12/27/2018  . TOTAL KNEE ARTHROPLASTY Left 12/27/2018   Procedure: LEFT TOTAL KNEE ARTHROPLASTY;  Surgeon: Mcarthur Rossetti, MD;  Location: [facility];  Service: Orthopedics;  Laterality: Left;  . TUBAL LIGATION  ~2000  . WISDOM TOOTH EXTRACTION      There were no vitals filed for this visit.  Subjective Assessment - 05/22/19 0718    Subjective  COVID 19 screening performed on patient upon arrival. Reports discomfort this morning on medial L knee. Reports that she  was able to rest over the weekend and had to rest each time it started feeling tight.    Pertinent History  Previous left knee injury.    Limitations  Reading    Patient Stated Goals  See above.    Currently in Pain?  Yes    Pain Score  2     Pain Location  Knee    Pain Orientation  Left;Medial    Pain Descriptors / Indicators  Discomfort    Pain Type  Surgical pain    Pain Onset  More than a month ago    Pain Frequency  Intermittent         [facility] PT Assessment - 05/22/19 0001      Assessment   Medical Diagnosis  S/p left total knee replacement.    Referring Provider (PT)  Jean Rosenthal MD.    Onset Date/Surgical Date  12/27/18    Next MD Visit  07/10/2019      Restrictions   Weight Bearing Restrictions  No      ROM / Strength   AROM / PROM / Strength  AROM      AROM   Overall AROM   Deficits    AROM Assessment Site  Knee  Right/Left Knee  Left    Left Knee Flexion  115                   [facility] Adult PT Treatment/Exercise - 05/22/19 0001      Knee/Hip Exercises: Stretches   Sports administrator  Left;5 reps;10 seconds      Knee/Hip Exercises: Aerobic   Stationary Bike  Seat 7 x10 min      Knee/Hip Exercises: Machines for Strengthening   Cybex Knee Extension  20# 3x10 reps    Cybex Knee Flexion  50# 3x10 reps    Cybex Leg Press  3 pl, seat 6 x30 reps with 6# ball squeeze      Knee/Hip Exercises: Standing   Functional Squat  20 reps;3 seconds      Modalities   Modalities  Vasopneumatic      Vasopneumatic   Number Minutes Vasopneumatic   15 minutes    Vasopnuematic Location   Knee    Vasopneumatic Pressure  Medium    Vasopneumatic Temperature   66      Manual Therapy   Manual Therapy  Joint mobilization;Passive ROM;Myofascial release    Joint Mobilization  Grade III-IV tibiofemoral joint mobilizations in order to increase ROM    Myofascial Release  MFR to L quad, distal hip adductors, VMO to reduce TPs nad muscle tightness    Passive ROM  PROM  of L knee into flexion to improve ROM                  PT Long Term Goals - 04/27/19 0859      PT LONG TERM GOAL #1   Title  Independent with a HEP.    Time  6    Period  Weeks    Status  Achieved      PT LONG TERM GOAL #2   Title  Full left active knee extension in order to normalize gait.    Time  6    Period  Weeks    Status  Achieved      PT LONG TERM GOAL #3   Title  Active knee flexion to 115-120 degrees+ so the patient can perform functional tasks and do so with pain not > 2-3/10.    Time  6    Period  Weeks    Status  Not Met      PT LONG TERM GOAL #4   Title  Perform a reciprocating stair gait with one railing with pain not > 2-3/10.    Time  6    Period  Weeks    Status  Partially Met            Plan - 05/22/19 0803    Clinical Impression Statement  Patient presented in clinic with low level L medial knee discomfort upon arrival. Patient able to complete all therex with only reports of difficulty and discomfort with max knee flexion. Patient did report more L VMO, distal hip adductor discomfort around site of bursa and manual therapy completed. More ROM allowed during PROM session and 115 deg max accomplished. Normal vasopnuematic response noted following removal of the modality.    Rehab Potential  Excellent    PT Frequency  2x / week    PT Duration  6 weeks    PT Treatment/Interventions  ADLs/Self Care Home Management;Cryotherapy;Occupational psychologist;Therapeutic activities;Therapeutic exercise;Patient/family education;Manual techniques;Vasopneumatic Device;Passive range of motion    PT Next Visit Plan  Continue ROM progression on stationary bike  and quad strengthening per POC.    Consulted and Agree with Plan of Care  Patient       Patient will benefit from skilled therapeutic intervention in order to improve the following deficits and impairments:  Abnormal gait, Pain, Decreased activity tolerance, Decreased  strength, Decreased range of motion, Increased edema  Visit Diagnosis: Stiffness of left knee, not elsewhere classified  Localized edema  Chronic pain of left knee     Problem List Patient Active Problem List   Diagnosis Date Noted  . 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/27/2018  . Unilateral primary osteoarthritis, left knee 11/01/2018  . Primary osteoarthritis of left knee 11/15/2017  . Dense breast tissue on mammogram 10/21/2016  . HLD (hyperlipidemia) 10/21/2016  . LVH (left ventricular hypertrophy) 10/21/2016  . Mitral regurgitation 10/21/2016  . Abnormal uterine bleeding (AUB) 10/11/2015  . Hypertension 10/01/2015  . Menorrhagia with regular cycle 06/26/2014  . ADD (attention deficit disorder) 02/18/2012  . Hypothyroidism 12/30/2011    Standley Brooking, PTA 05/22/2019, 8:49 AM  [facility] [address] [place], [place], [address] Phone: [phone]   Fax:  [phone]  Name: NOAH PELAEZ MRN: [id number] Date of Birth: [birth year]

## 2019-05-24 ENCOUNTER — Other Ambulatory Visit: Payer: Self-pay

## 2019-05-24 ENCOUNTER — Ambulatory Visit: Payer: No Typology Code available for payment source | Admitting: Physical Therapy

## 2019-05-24 ENCOUNTER — Encounter: Payer: Self-pay | Admitting: Physical Therapy

## 2019-05-24 DIAGNOSIS — R600 Localized edema: Secondary | ICD-10-CM

## 2019-05-24 DIAGNOSIS — M25662 Stiffness of left knee, not elsewhere classified: Secondary | ICD-10-CM | POA: Diagnosis not present

## 2019-05-24 DIAGNOSIS — M25562 Pain in left knee: Secondary | ICD-10-CM

## 2019-05-24 DIAGNOSIS — G8929 Other chronic pain: Secondary | ICD-10-CM

## 2019-05-24 NOTE — Therapy (Signed)
[facility] [place], [place], [address] Phone: [phone]   Fax:  [phone]  Physical Therapy Treatment  Patient Details  Name: [name] MRN: [id number] Date of Birth: [birth year] Referring Provider (PT): Jean Rosenthal MD.   Encounter Date: 05/24/2019  PT End of Session - 05/24/19 0757    Visit Number  30    Number of Visits  30    Date for PT Re-Evaluation  05/15/19    Authorization Type  FOTO AT LEAST EVERY 5TH VISITS.  PROGRESS NOTE AT 10TH VISIT.    PT Start Time  0719    PT Stop Time  0811    PT Time Calculation (min)  52 min    Activity Tolerance  Patient tolerated treatment well    Behavior During Therapy  [facility] for tasks assessed/performed       Past Medical History:  Diagnosis Date  . Abnormal uterine bleeding (AUB) 10/11/2015  . ADD (attention deficit disorder)   . Anxiety   . Arthritis   . Chronic headache   . Depression   . Depression with anxiety 12/30/2011  . Edema   . GERD (gastroesophageal reflux disease)   . Heart murmur   . Heavy periods   . HLD (hyperlipidemia) 10/21/2016  . Hypertension   . Hypothyroidism   . Iron deficiency anemia   . Joint pain   . Pneumonia    patient doesnt remember this  . Thyroid nodule    ultrasound 10/13  . Vitamin D deficiency     Past Surgical History:  Procedure Laterality Date  . JOINT REPLACEMENT    . tooth implant  2016  . TOTAL KNEE ARTHROPLASTY Left 12/27/2018  . TOTAL KNEE ARTHROPLASTY Left 12/27/2018   Procedure: LEFT TOTAL KNEE ARTHROPLASTY;  Surgeon: Mcarthur Rossetti, MD;  Location: [facility];  Service: Orthopedics;  Laterality: Left;  . TUBAL LIGATION  ~2000  . WISDOM TOOTH EXTRACTION      There were no vitals filed for this visit.  Subjective Assessment - 05/24/19 0719    Subjective  COVID 19 screening performed on patient upon arrival. Reports first few revolutions on stationary bike were uncomfortable.     Pertinent  History  Previous left knee injury.    Limitations  Reading    Patient Stated Goals  See above.    Currently in Pain?  Yes    Pain Score  1     Pain Location  Knee    Pain Orientation  Left    Pain Descriptors / Indicators  Discomfort    Pain Type  Surgical pain    Pain Onset  More than a month ago    Pain Frequency  Intermittent         [facility] PT Assessment - 05/24/19 0001      Assessment   Medical Diagnosis  S/p left total knee replacement.    Referring Provider (PT)  Jean Rosenthal MD.    Onset Date/Surgical Date  12/27/18    Next MD Visit  07/10/2019      Restrictions   Weight Bearing Restrictions  No      ROM / Strength   AROM / PROM / Strength  AROM      AROM   Overall AROM   Within functional limits for tasks performed    AROM Assessment Site  Knee    Right/Left Knee  Left    Left Knee Flexion  115  [facility] Adult PT Treatment/Exercise - 05/24/19 0001      Ambulation/Gait   Ambulation/Gait  --    Stairs  Yes    Stairs Assistance  6: Modified independent (Device/Increase time)    Stair Management Technique  One rail Right;Alternating pattern;Forwards    Number of Stairs  4   x3 RT   Height of Stairs  6.5      Knee/Hip Exercises: Aerobic   Stationary Bike  Seat 7 x10 min      Knee/Hip Exercises: Machines for Strengthening   Cybex Knee Extension  20# 3x10 reps    Cybex Knee Flexion  50# 3x10 reps    Cybex Leg Press  3 pl, seat 7 x10 reps LLE eccentric lowering      Knee/Hip Exercises: Standing   Functional Squat  20 reps;3 seconds      Knee/Hip Exercises: Prone   Contract/Relax to Increase Flexion  x10 reps      Modalities   Modalities  Electrical Stimulation;Vasopneumatic      Electrical Stimulation   Electrical Stimulation Location  L knee    Electrical Stimulation Action  IFC    Electrical Stimulation Parameters  80-150 hz x15 min    Electrical Stimulation Goals  Pain;Edema      Vasopneumatic   Number Minutes  Vasopneumatic   15 minutes    Vasopnuematic Location   Knee    Vasopneumatic Pressure  Medium    Vasopneumatic Temperature   34                  PT Long Term Goals - 05/24/19 0746      PT LONG TERM GOAL #1   Title  Independent with a HEP.    Time  6    Period  Weeks    Status  Achieved      PT LONG TERM GOAL #2   Title  Full left active knee extension in order to normalize gait.    Time  6    Period  Weeks    Status  Achieved      PT LONG TERM GOAL #3   Title  Active knee flexion to 115-120 degrees+ so the patient can perform functional tasks and do so with pain not > 2-3/10.    Time  6    Period  Weeks    Status  Achieved      PT LONG TERM GOAL #4   Title  Perform a reciprocating stair gait with one railing with pain not > 2-3/10.    Time  6    Period  Weeks    Status  Achieved   But with hesitancy 05/24/2019           Plan - 05/24/19 0804    Clinical Impression Statement  Patient presented in clinic with low level L knee pain. Patient has progressed well with strengthening although she demonstrated eccentric weakness with leg press. Patient displayed hesitancy during stair assesment today with LLE eccentric lowering for RLE advancement. Patient able to achieve all goals even though end range L knee flexion is difficult at times. Normal modalities response noted following removal of the modalities. D/C FOTO limitation 25% and CJ status.    Rehab Potential  Excellent    PT Frequency  2x / week    PT Duration  6 weeks    PT Treatment/Interventions  ADLs/Self Care Home Management;Cryotherapy;Occupational psychologist;Therapeutic activities;Therapeutic exercise;Patient/family education;Manual techniques;Vasopneumatic Device;Passive range of motion    PT  Next Visit Plan  D/C summary required.    Consulted and Agree with Plan of Care  Patient       Patient will benefit from skilled therapeutic intervention in order to improve the  following deficits and impairments:  Abnormal gait, Pain, Decreased activity tolerance, Decreased strength, Decreased range of motion, Increased edema  Visit Diagnosis: Stiffness of left knee, not elsewhere classified  Localized edema  Chronic pain of left knee     Problem List Patient Active Problem List   Diagnosis Date Noted  . 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/27/2018  . Unilateral primary osteoarthritis, left knee 11/01/2018  . Primary osteoarthritis of left knee 11/15/2017  . Dense breast tissue on mammogram 10/21/2016  . HLD (hyperlipidemia) 10/21/2016  . LVH (left ventricular hypertrophy) 10/21/2016  . Mitral regurgitation 10/21/2016  . Abnormal uterine bleeding (AUB) 10/11/2015  . Hypertension 10/01/2015  . Menorrhagia with regular cycle 06/26/2014  . ADD (attention deficit disorder) 02/18/2012  . Hypothyroidism 12/30/2011    Standley Brooking, PTA 05/24/19 8:14 AM   [facility] [place], [place], [address] Phone: [phone]   Fax:  [phone]  Name: CHERYLYN SUNDBY MRN: [id number] Date of Birth: [birth year]  PHYSICAL THERAPY DISCHARGE SUMMARY  Visits from Start of Care: 30.  Current functional level related to goals / functional outcomes: See above.   Remaining deficits: All goals met.   Education / Equipment: HEP. Plan: Patient agrees to discharge.  Patient goals were met. Patient is being discharged due to meeting the stated rehab goals.  ?????         Mali Applegate MPT

## 2019-06-14 ENCOUNTER — Telehealth: Payer: Self-pay

## 2019-06-14 NOTE — Telephone Encounter (Signed)
Patient would like to know if she needs to take an antibiotic before her dentist appointment on Thursday, 06/15/2019?   Patient had Left TKA on 12/27/2018.  Cb# is [id number].  Please advise.  Thank You.

## 2019-06-14 NOTE — Telephone Encounter (Signed)
Patient aware she does not need antibiotic

## 2019-06-28 MED FILL — LISINOPRIL-HCTZ 10-12.5 MG: 10-12.5 | 30 days supply | Qty: 30 | Fill #3

## 2019-07-10 ENCOUNTER — Ambulatory Visit (INDEPENDENT_AMBULATORY_CARE_PROVIDER_SITE_OTHER): Payer: No Typology Code available for payment source | Admitting: Orthopaedic Surgery

## 2019-07-10 ENCOUNTER — Encounter: Payer: Self-pay | Admitting: Orthopaedic Surgery

## 2019-07-10 ENCOUNTER — Ambulatory Visit (INDEPENDENT_AMBULATORY_CARE_PROVIDER_SITE_OTHER): Payer: No Typology Code available for payment source

## 2019-07-10 DIAGNOSIS — Z96652 Presence of left artificial knee joint: Secondary | ICD-10-CM | POA: Diagnosis not present

## 2019-07-10 NOTE — Progress Notes (Signed)
The patient is now 6 months status post a left total knee arthroplasty.  This was done with a press-fit knee system.  She has been doing well until a month ago she was clipped by her dog landing on the left hip and twisting that left knee.  It did hurt for a few days but now has become pain-free.  She still reports knee swelling and is gotten more active.  She is also significant out of weight.  She denies any instability with her total knee.  On examination of her left knee it is still swollen but her extension is full and her flexion is to past 100 degrees.  The knee feels ligamentously stable to me.  X-rays of the left knee are obtained today and show no evidence of loosening.  There is no malalignment.  At this point should continue increase her activities as she is she tolerates.  We will see her back in 6 months at the one-year follow-up with a final AP and lateral of the left knee.

## 2019-07-20 ENCOUNTER — Other Ambulatory Visit: Payer: Self-pay | Admitting: Internal Medicine

## 2019-07-20 DIAGNOSIS — Z1231 Encounter for screening mammogram for malignant neoplasm of breast: Secondary | ICD-10-CM

## 2019-08-04 MED FILL — LISINOPRIL-HCTZ 10-12.5 MG: 10-12.5 | 30 days supply | Qty: 30 | Fill #4

## 2019-08-07 ENCOUNTER — Other Ambulatory Visit: Payer: Self-pay

## 2019-08-07 ENCOUNTER — Ambulatory Visit
Admission: RE | Admit: 2019-08-07 | Discharge: 2019-08-07 | Disposition: A | Discharge status: Home / Self Care | Payer: No Typology Code available for payment source | Source: Ambulatory Visit

## 2019-08-07 DIAGNOSIS — Z1231 Encounter for screening mammogram for malignant neoplasm of breast: Secondary | ICD-10-CM

## 2019-09-18 MED FILL — LISINOPRIL-HCTZ 10-12.5 MG: 10-12.5 | 90 days supply | Qty: 90 | Fill #0

## 2019-11-07 MED FILL — LEVOTHYROXINE 200 MCG TAB: 200 | 90 days supply | Qty: 90 | Fill #1

## 2019-12-26 ENCOUNTER — Other Ambulatory Visit: Payer: Self-pay

## 2019-12-27 ENCOUNTER — Encounter: Payer: Self-pay | Admitting: Women's Health

## 2019-12-27 ENCOUNTER — Ambulatory Visit (INDEPENDENT_AMBULATORY_CARE_PROVIDER_SITE_OTHER): Payer: No Typology Code available for payment source | Admitting: Women's Health

## 2019-12-27 VITALS — BP 118/78 | Ht 67.0 in | Wt 219.0 lb

## 2019-12-27 DIAGNOSIS — Z01419 Encounter for gynecological examination (general) (routine) without abnormal findings: Secondary | ICD-10-CM

## 2019-12-27 DIAGNOSIS — E038 Other specified hypothyroidism: Secondary | ICD-10-CM

## 2019-12-27 DIAGNOSIS — Z1151 Encounter for screening for human papillomavirus (HPV): Secondary | ICD-10-CM

## 2019-12-27 DIAGNOSIS — E559 Vitamin D deficiency, unspecified: Secondary | ICD-10-CM

## 2019-12-27 DIAGNOSIS — Z13220 Encounter for screening for lipoid disorders: Secondary | ICD-10-CM | POA: Diagnosis not present

## 2019-12-27 LAB — CBC WITH DIFFERENTIAL/PLATELET
Absolute Monocytes: 504 cells/uL (ref 200–950)
Basophils Absolute: 58 cells/uL (ref 0–200)
Basophils Relative: 0.8 %
Eosinophils Absolute: 409 cells/uL (ref 15–500)
Eosinophils Relative: 5.6 %
HCT: 31.4 % — ABNORMAL LOW (ref 35.0–45.0)
Hemoglobin: 9.2 g/dL — ABNORMAL LOW (ref 11.7–15.5)
Lymphs Abs: 1913 cells/uL (ref 850–3900)
MCH: 20 pg — ABNORMAL LOW (ref 27.0–33.0)
MCHC: 29.3 g/dL — ABNORMAL LOW (ref 32.0–36.0)
MCV: 68.4 fL — ABNORMAL LOW (ref 80.0–100.0)
MPV: 9.4 fL (ref 7.5–12.5)
Monocytes Relative: 6.9 %
Neutro Abs: 4417 cells/uL (ref 1500–7800)
Neutrophils Relative %: 60.5 %
Platelets: 409 10*3/uL — ABNORMAL HIGH (ref 140–400)
RBC: 4.59 10*6/uL (ref 3.80–5.10)
RDW: 16.9 % — ABNORMAL HIGH (ref 11.0–15.0)
Total Lymphocyte: 26.2 %
WBC: 7.3 10*3/uL (ref 3.8–10.8)

## 2019-12-27 LAB — COMPREHENSIVE METABOLIC PANEL
AG Ratio: 1.8 (calc) (ref 1.0–2.5)
ALT: 21 U/L (ref 6–29)
AST: 32 U/L (ref 10–35)
Albumin: 4.5 g/dL (ref 3.6–5.1)
Alkaline phosphatase (APISO): 100 U/L (ref 31–125)
BUN: 16 mg/dL (ref 7–25)
CO2: 25 mmol/L (ref 20–32)
Calcium: 9.5 mg/dL (ref 8.6–10.2)
Chloride: 103 mmol/L (ref 98–110)
Creat: 0.96 mg/dL (ref 0.50–1.10)
Globulin: 2.5 g/dL (calc) (ref 1.9–3.7)
Glucose, Bld: 95 mg/dL (ref 65–99)
Potassium: 4.7 mmol/L (ref 3.5–5.3)
Sodium: 137 mmol/L (ref 135–146)
Total Bilirubin: 0.3 mg/dL (ref 0.2–1.2)
Total Protein: 7 g/dL (ref 6.1–8.1)

## 2019-12-27 LAB — T4: T4, Total: 10.8 ug/dL (ref 5.1–11.9)

## 2019-12-27 LAB — LIPID PANEL
Cholesterol: 196 mg/dL (ref <200)
HDL: 44 mg/dL — ABNORMAL LOW (ref >=50)
LDL Cholesterol (Calc): 127 mg/dL (calc) — ABNORMAL HIGH
Non-HDL Cholesterol (Calc): 152 mg/dL (calc) — ABNORMAL HIGH (ref <130)
Total CHOL/HDL Ratio: 4.5 (calc) (ref <5.0)
Triglycerides: 140 mg/dL (ref <150)

## 2019-12-27 LAB — TSH: TSH: 0.64 mIU/L

## 2019-12-27 LAB — CBC MORPHOLOGY

## 2019-12-27 LAB — VITAMIN D 25 HYDROXY (VIT D DEFICIENCY, FRACTURES): Vit D, 25-Hydroxy: 16 ng/mL — ABNORMAL LOW (ref 30–100)

## 2019-12-27 MED ORDER — TRANEXAMIC ACID 650 MG PO TABS: 1300.0000 mg | ORAL_TABLET | Freq: Three times a day (TID) | ORAL | 12 refills | Status: DC

## 2019-12-27 MED FILL — TRANEXAMIC ACID 650 MG TAB: 650 | 5 days supply | Qty: 30 | Fill #0

## 2019-12-27 NOTE — Patient Instructions (Addendum)
Vit D 2000 iu daily Good to meet you Tranexamic acid oral tablets What is this medicine? TRANEXAMIC ACID (TRAN ex AM ik AS id) slows down or stops blood clots from being broken down. This medicine is used to treat heavy monthly menstrual bleeding. This medicine may be used for other purposes; ask your health care provider or pharmacist if you have questions. COMMON BRAND NAME(S): Cyklokapron, Lysteda What should I tell my health care provider before I take this medicine? They need to know if you have any of these conditions:  bleeding in the brain  blood clotting problems  kidney disease  vision problems  an unusual allergic reaction to tranexamic acid, other medicines, foods, dyes, or preservatives  pregnant or trying to get pregnant  breast-feeding How should I use this medicine? Take this medicine by mouth with a glass of water. Follow the directions on the prescription label. Do not cut, crush, or chew this medicine. You can take it with or without food. If it upsets your stomach, take it with food. Take your medicine at regular intervals. Do not take it more often than directed. Do not stop taking except on your doctor's advice. Do not take this medicine until your period has started. Do not take it for more than 5 days in a row. Do not take this medicine when you do not have your period. Talk to your pediatrician regarding the use of this medicine in children. While this drug may be prescribed for female children as Kitzia Camus as 30 years of age for selected conditions, precautions do apply. Overdosage: If you think you have taken too much of this medicine contact a poison control center or emergency room at once. NOTE: This medicine is only for you. Do not share this medicine with others. What if I miss a dose? If you miss a dose, take it when you remember, and then take your next dose at least 6 hours later. Do not take more than 2 tablets at a time to make up for missed doses. What  may interact with this medicine? Do not take this medicine with any of the following medications:  estrogens  birth control pills, patches, injections, rings or other devices that contain both an estrogen and a progestin This medicine may also interact with the following medications:  certain medicines used to help your blood clot  tretinoin (taken by mouth) This list may not describe all possible interactions. Give your health care provider a list of all the medicines, herbs, non-prescription drugs, or dietary supplements you use. Also tell them if you smoke, drink alcohol, or use illegal drugs. Some items may interact with your medicine. What should I watch for while using this medicine? Tell your doctor or healthcare professional if your symptoms do not start to get better or if they get worse. Tell your doctor or healthcare professional if you notice any eye problems while taking this medicine. Your doctor will refer you to an eye doctor who will examine your eyes. What side effects may I notice from receiving this medicine? Side effects that you should report to your doctor or health care professional as soon as possible:  allergic reactions like skin rash, itching or hives, swelling of the face, lips, or tongue  breathing difficulties  changes in vision  sudden or severe pain in the chest, legs, head, or groin  unusually weak or tired Side effects that usually do not require medical attention (report to your doctor or health care professional if  they continue or are bothersome):  back pain  headache  muscle or joint aches  sinus and nasal problems  stomach pain  tiredness This list may not describe all possible side effects. Call your doctor for medical advice about side effects. You may report side effects to FDA at 1-800-FDA-1088. Where should I keep my medicine? Keep out of the reach of children. Store at room temperature between 15 and 30 degrees C (59 and 86  degrees F). Throw away any unused medicine after the expiration date. NOTE: This sheet is a summary. It may not cover all possible information. If you have questions about this medicine, talk to your doctor, pharmacist, or health care provider.  2020 Elsevier/Gold Standard (2016-01-02 09:12:15)  Health Maintenance, Female Adopting a healthy lifestyle and getting preventive care are important in promoting health and wellness. Ask your health care provider about:  The right schedule for you to have regular tests and exams.  Things you can do on your own to prevent diseases and keep yourself healthy. What should I know about diet, weight, and exercise? Eat a healthy diet   Eat a diet that includes plenty of vegetables, fruits, low-fat dairy products, and lean protein.  Do not eat a lot of foods that are high in solid fats, added sugars, or sodium. Maintain a healthy weight Body mass index (BMI) is used to identify weight problems. It estimates body fat based on height and weight. Your health care provider can help determine your BMI and help you achieve or maintain a healthy weight. Get regular exercise Get regular exercise. This is one of the most important things you can do for your health. Most adults should:  Exercise for at least 150 minutes each week. The exercise should increase your heart rate and make you sweat (moderate-intensity exercise).  Do strengthening exercises at least twice a week. This is in addition to the moderate-intensity exercise.  Spend less time sitting. Even light physical activity can be beneficial. Watch cholesterol and blood lipids Have your blood tested for lipids and cholesterol at 50 years of age, then have this test every 5 years. Have your cholesterol levels checked more often if:  Your lipid or cholesterol levels are high.  You are older than 50 years of age.  You are at high risk for heart disease. What should I know about cancer  screening? Depending on your health history and family history, you may need to have cancer screening at various ages. This may include screening for:  Breast cancer.  Cervical cancer.  Colorectal cancer.  Skin cancer.  Lung cancer. What should I know about heart disease, diabetes, and high blood pressure? Blood pressure and heart disease  High blood pressure causes heart disease and increases the risk of stroke. This is more likely to develop in people who have high blood pressure readings, are of African descent, or are overweight.  Have your blood pressure checked: ? Every 3-5 years if you are 31-45 years of age. ? Every year if you are 52 years old or older. Diabetes Have regular diabetes screenings. This checks your fasting blood sugar level. Have the screening done:  Once every three years after age 47 if you are at a normal weight and have a low risk for diabetes.  More often and at a younger age if you are overweight or have a high risk for diabetes. What should I know about preventing infection? Hepatitis B If you have a higher risk for hepatitis B, you  should be screened for this virus. Talk with your health care provider to find out if you are at risk for hepatitis B infection. Hepatitis C Testing is recommended for:  Everyone born from [birth year] through 1966.  Anyone with known risk factors for hepatitis C. Sexually transmitted infections (STIs)  Get screened for STIs, including gonorrhea and chlamydia, if: ? You are sexually active and are younger than 50 years of age. ? You are older than 50 years of age and your health care provider tells you that you are at risk for this type of infection. ? Your sexual activity has changed since you were last screened, and you are at increased risk for chlamydia or gonorrhea. Ask your health care provider if you are at risk.  Ask your health care provider about whether you are at high risk for HIV. Your health care provider may  recommend a prescription medicine to help prevent HIV infection. If you choose to take medicine to prevent HIV, you should first get tested for HIV. You should then be tested every 3 months for as long as you are taking the medicine. Pregnancy  If you are about to stop having your period (premenopausal) and you may become pregnant, seek counseling before you get pregnant.  Take 400 to 800 micrograms (mcg) of folic acid every day if you become pregnant.  Ask for birth control (contraception) if you want to prevent pregnancy. Osteoporosis and menopause Osteoporosis is a disease in which the bones lose minerals and strength with aging. This can result in bone fractures. If you are 55 years old or older, or if you are at risk for osteoporosis and fractures, ask your health care provider if you should:  Be screened for bone loss.  Take a calcium or vitamin D supplement to lower your risk of fractures.  Be given hormone replacement therapy (HRT) to treat symptoms of menopause. Follow these instructions at home: Lifestyle  Do not use any products that contain nicotine or tobacco, such as cigarettes, e-cigarettes, and chewing tobacco. If you need help quitting, ask your health care provider.  Do not use street drugs.  Do not share needles.  Ask your health care provider for help if you need support or information about quitting drugs. Alcohol use  Do not drink alcohol if: ? Your health care provider tells you not to drink. ? You are pregnant, may be pregnant, or are planning to become pregnant.  If you drink alcohol: ? Limit how much you use to 0-1 drink a day. ? Limit intake if you are breastfeeding.  Be aware of how much alcohol is in your drink. In the [place] one drink equals one 12 oz bottle of beer (355 mL), one 5 oz glass of wine (148 mL), or one 1 oz glass of hard liquor (44 mL). General instructions  Schedule regular health, dental, and eye exams.  Stay current with your  vaccines.  Tell your health care provider if: ? You often feel depressed. ? You have ever been abused or do not feel safe at home. Summary  Adopting a healthy lifestyle and getting preventive care are important in promoting health and wellness.  Follow your health care provider's instructions about healthy diet, exercising, and getting tested or screened for diseases.  Follow your health care provider's instructions on monitoring your cholesterol and blood pressure. This information is not intended to replace advice given to you by your health care provider. Make sure you discuss any questions you have with  your health care provider. Document Revised: 11/23/2018 Document Reviewed: 11/23/2018 Elsevier Patient Education  2020 [employer].

## 2019-12-27 NOTE — Progress Notes (Signed)
[name] 02-Feb-1971 [id number]    History:    Presents for annual exam.  Regular monthly cycle/BTL.  7-day cycles first 3 days extremely heavy with clots changing protection every hour.  Cycles have always been heavy, Mirena IUD in the past and had bleeding daily for 1 year and had it removed.  Had contemplated ablation in the past but did not follow-up.  Fibroid uterus, 3 cm and two 1 cm fibroids.  Normal Pap and mammogram history.  Primary care manages hypertension, GERD, hypothyroidism, requested labs to be drawn for upcoming appointment.  (Primary care's office does not have a in-house lab.)  Past medical history, past surgical history, family history and social history were all reviewed and documented in the EPIC chart.  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  History of a left knee replacement.  2 grown children.  Parents healthy.  ROS:  A ROS was performed and pertinent positives and negatives are included.  Exam:  Vitals:   12/27/19 0848  BP: 118/78  Weight: 219 lb (99.3 kg)  Height: 5\' 7"  (1.702 m)   Body mass index is 34.3 kg/m.   General appearance:  Normal Thyroid:  Symmetrical, normal in size, without palpable masses or nodularity. Respiratory  Auscultation:  Clear without wheezing or rhonchi Cardiovascular  Auscultation:  Regular rate, without rubs, murmurs or gallops  Edema/varicosities:  Not grossly evident Abdominal  Soft,nontender, without masses, guarding or rebound.  Liver/spleen:  No organomegaly noted  Hernia:  None appreciated  Skin  Inspection:  Grossly normal   Breasts: Examined lying and sitting.     Right: Without masses, retractions, discharge or axillary adenopathy.     Left: Without masses, retractions, discharge or axillary adenopathy. Gentitourinary   Inguinal/mons:  Normal without inguinal adenopathy  External genitalia:  Normal  BUS/Urethra/Skene's glands:  Normal  Vagina:  Normal  Cervix:  Normal  Uterus: Bulky  in  size, shape and contour.  Midline and mobile  Adnexa/parametria:     Rt: Without masses or tenderness.   Lt: Without masses or tenderness.  Anus and perineum: Normal  Digital rectal exam: Normal sphincter tone without palpated masses or tenderness  Assessment/Plan:  49 y.o. MWF G3 P2 for annual exam with menorrhagia.  Regular monthly 7-day cycle with menorrhagia/BTL Hypertension, hypothyroidism, GERD-primary care manages meds Obesity  Plan: Options for menorrhagia discussed, ablation, failed Mirena IUD in the past, progestin only OCs and Lysteda, Lysteda 650 mg 2 tablets 3 times daily at onset of menses for 5 days prescription given instructed to call if continued menorrhagia.  Sonohysterogram prior to possible ablation discussed.  SBEs, continue annual 3D screening mammogram, calcium rich foods, vitamin D 2000 IUs daily.  History of low vitamin D.  Reviewed importance of increasing exercise and decreasing calories/carbs.  Screening colonoscopy at age 49 plans to have done at any [facility].  CBC, CMP, lipid panel, TSH, T4, vitamin D, Pap with HR HPV typing, new screening guidelines reviewed.  Will print copy of labs to take to primary care    [place], 9:34 AM 12/27/2019

## 2019-12-28 LAB — PAP, TP IMAGING W/ HPV RNA, RFLX HPV TYPE 16,18/45: HPV DNA High Risk: NOT DETECTED

## 2020-01-01 ENCOUNTER — Other Ambulatory Visit: Payer: Self-pay

## 2020-01-01 DIAGNOSIS — D500 Iron deficiency anemia secondary to blood loss (chronic): Secondary | ICD-10-CM

## 2020-01-01 DIAGNOSIS — N920 Excessive and frequent menstruation with regular cycle: Secondary | ICD-10-CM

## 2020-01-10 ENCOUNTER — Other Ambulatory Visit: Payer: Self-pay

## 2020-01-10 ENCOUNTER — Encounter: Payer: Self-pay | Admitting: Orthopaedic Surgery

## 2020-01-10 ENCOUNTER — Ambulatory Visit (INDEPENDENT_AMBULATORY_CARE_PROVIDER_SITE_OTHER): Payer: No Typology Code available for payment source | Admitting: Orthopaedic Surgery

## 2020-01-10 ENCOUNTER — Ambulatory Visit: Payer: Self-pay

## 2020-01-10 DIAGNOSIS — Z96652 Presence of left artificial knee joint: Secondary | ICD-10-CM | POA: Diagnosis not present

## 2020-01-10 NOTE — Progress Notes (Signed)
The patient is a 49 year old female who is just over a year out from a left total knee arthroplasty.  She is very active.  She was even in the gym this morning.  She has some swelling still in her knee but no issues with walking or exercise pain.  She says occasionally is tender in spots.  On exam she has full range of motion of her left knee.  There is no significant swelling today at all.  Her incisions healed nicely.  The knee is ligamentously stable on varus and valgus stressing and anterior to posterior stressing.  2 views of the left knee show well-seated press-fit knee implant with no complicating features and good alignment.  At this point follow-up can be as needed.  She understands if there is any issues at all related to that knee she will let us know.  This would include swelling or a dull aching pain that just seems to worsen and not go away.  If there is any other issues at all she knows to give [place] a call.

## 2020-02-06 MED FILL — LISINOPRIL-HCTZ 10-12.5 MG: 10-12.5 | 90 days supply | Qty: 90 | Fill #1

## 2020-02-06 MED FILL — TRANEXAMIC ACID 650 MG TAB: 650 | 5 days supply | Qty: 30 | Fill #1

## 2020-02-14 ENCOUNTER — Other Ambulatory Visit: Payer: Self-pay

## 2020-02-15 ENCOUNTER — Other Ambulatory Visit: Payer: Self-pay | Admitting: Women's Health

## 2020-02-15 ENCOUNTER — Ambulatory Visit (INDEPENDENT_AMBULATORY_CARE_PROVIDER_SITE_OTHER): Payer: No Typology Code available for payment source | Admitting: Obstetrics & Gynecology

## 2020-02-15 ENCOUNTER — Ambulatory Visit: Payer: No Typology Code available for payment source

## 2020-02-15 ENCOUNTER — Encounter: Payer: Self-pay | Admitting: Obstetrics & Gynecology

## 2020-02-15 VITALS — BP 130/84

## 2020-02-15 DIAGNOSIS — D500 Iron deficiency anemia secondary to blood loss (chronic): Secondary | ICD-10-CM | POA: Diagnosis not present

## 2020-02-15 DIAGNOSIS — N920 Excessive and frequent menstruation with regular cycle: Secondary | ICD-10-CM

## 2020-02-15 DIAGNOSIS — Z9851 Tubal ligation status: Secondary | ICD-10-CM

## 2020-02-15 MED ORDER — NORETHINDRONE 0.35 MG PO TABS: 1.0000 | ORAL_TABLET | Freq: Every day | ORAL | 4 refills | Status: DC

## 2020-02-15 MED FILL — NORLYDA 0.35 MG TABS: 0.35 | 84 days supply | Qty: 84 | Fill #0

## 2020-02-15 NOTE — Patient Instructions (Signed)
1. Menorrhagia with regular cycle Longstanding history of menorrhagia with secondary anemia.  Slightly improved with Tranexamic Acid x 2 months.  Under treatment for chronic hypertension and recent lab work showed a hyper cholesterolemia.  Very frequent breakthrough bleeding previously on the progesterone IUD.  Decision to try the Progestin-only pill.  Benefits/risks/usage reviewed with patient.  Prescription sent to pharmacy.  If menorrhagia persists, will consider Novasure Endometrial Ablation.  2. Anemia due to chronic blood loss Last Hb at 9.2 in 12/2019, low but stable x last year.  3. S/P tubal ligation  Other orders - norethindrone (MICRONOR) 0.35 MG tablet; Take 1 tablet (0.35 mg total) by mouth daily.  Kamiko, it was a pleasure seeing you today!

## 2020-02-15 NOTE — Progress Notes (Signed)
    [name] 11-29-1971 [id number]        49 y.o.  [id number] s/p TL  RP: Menorrhagia for Pelvic US/Possible Sonohysterogram  HPI: Longstanding heavy menses monthly. Slightly better on Tranexamic Acid x 2 months.  Very frequent BTB, almost every day x 1 year on the Mirena IUD previously.  H/O cHTN and Hypercholesterolemia.   OB History  Gravida Para Term Preterm AB Living  3 2 2   1 2   SAB TAB Ectopic Multiple Live Births    1          # Outcome Date GA Lbr Len/2nd Weight Sex Delivery Anes PTL Lv  3 Term           2 Term           1 TAB             Past medical history,surgical history, problem list, medications, allergies, family history and social history were all reviewed and documented in the EPIC chart.   Directed ROS with pertinent positives and negatives documented in the history of present illness/assessment and plan.  Exam:  Vitals:   02/15/20 0844  BP: 130/84   General appearance:  Normal  Pelvic US today: T/V images.  Retroverted uterus slightly enlarged with inhomogenous myometrium, streaky shadowing suggestive of adenomyosis.  Several intramural fibroids, the largest is measured at 2.7 cm.  No fibroids distorting the cavity.  The overall uterine size is measured at 9.8 x 7.15 x 6.32 cm.  The endometrial lining is thin and symmetrical measured at 2.99 mm.  No mass or thickening or abnormal blood flow to the endometrium.  Both ovaries are small with atrophic appearance.  No adnexal mass seen.  No free fluid in the posterior cul-de-sac.   Assessment/Plan:  49 y.o. [id number]   1. Menorrhagia with regular cycle Longstanding history of menorrhagia with secondary anemia.  Slightly improved with Tranexamic Acid x 2 months.  Under treatment for chronic hypertension and recent lab work showed a hyper cholesterolemia.  Very frequent breakthrough bleeding previously on the progesterone IUD.  Decision to try the Progestin-only pill.  Benefits/risks/usage reviewed with  patient.  Prescription sent to pharmacy.  If menorrhagia persists, will consider Novasure Endometrial Ablation.  2. Anemia due to chronic blood loss Last Hb at 9.2 in 12/2019, low but stable x last year.  3. S/P tubal ligation  Other orders - norethindrone (MICRONOR) 0.35 MG tablet; Take 1 tablet (0.35 mg total) by mouth daily.  Princess Bruins MD, 9:10 AM 02/15/2020

## 2020-03-20 MED FILL — LEVOTHYROXINE SODIUM 200 MC: 200 | 90 days supply | Qty: 90 | Fill #2

## 2020-04-16 ENCOUNTER — Telehealth: Payer: Self-pay | Admitting: *Deleted

## 2020-04-16 NOTE — Telephone Encounter (Signed)
Patient was prescribed Micronor 0.35 mg tablet on OV 03/25/20. Patient sent [name] a my chart message c/o that she was on day 18 of bleeding,Nancy then told her to double up on micronor pills x 2 days. However patient said she finish the remaining pack of pills taking 2 daily and finished the pack, not taking 2 pills for 2 days only. Reports she is having life stressors and mis-read the message to only double up x 2 days. LMP:04/11/20 at the end of cycle now, light flow. Patient asked if safe to get a new pack and start taking 1 po daily now?

## 2020-04-19 NOTE — Telephone Encounter (Signed)
Patient informed with below note. 

## 2020-04-19 NOTE — Telephone Encounter (Signed)
Yes, can start a new pack now with 1 tab daily.

## 2020-05-10 MED FILL — ESCITALOPRAM 10 MG TABLET: 10 | 30 days supply | Qty: 30 | Fill #0

## 2020-06-10 MED FILL — LISINOPRIL-HCTZ 10-12.5 MG: 10-12.5 | 90 days supply | Qty: 90 | Fill #2

## 2020-06-10 MED FILL — ESCITALOPRAM 10 MG TABLET: 10 | 30 days supply | Qty: 30 | Fill #1

## 2020-06-18 ENCOUNTER — Other Ambulatory Visit (HOSPITAL_COMMUNITY): Payer: Self-pay | Admitting: Internal Medicine

## 2020-06-18 MED FILL — CYCLOBENZAPRINE HCL 10 MG T: 10 | 90 days supply | Qty: 90 | Fill #0

## 2020-07-25 ENCOUNTER — Other Ambulatory Visit: Payer: Self-pay | Admitting: Internal Medicine

## 2020-07-25 DIAGNOSIS — Z1231 Encounter for screening mammogram for malignant neoplasm of breast: Secondary | ICD-10-CM

## 2020-08-02 MED FILL — ESCITALOPRAM 10 MG TABLET: 10 | 30 days supply | Qty: 30 | Fill #2

## 2020-08-13 ENCOUNTER — Ambulatory Visit
Admission: RE | Admit: 2020-08-13 | Discharge: 2020-08-13 | Disposition: A | Discharge status: Home / Self Care | Payer: No Typology Code available for payment source | Source: Ambulatory Visit

## 2020-08-13 ENCOUNTER — Other Ambulatory Visit: Payer: Self-pay

## 2020-08-13 DIAGNOSIS — Z1231 Encounter for screening mammogram for malignant neoplasm of breast: Secondary | ICD-10-CM

## 2020-09-16 ENCOUNTER — Other Ambulatory Visit (HOSPITAL_COMMUNITY): Payer: Self-pay | Admitting: Internal Medicine

## 2020-09-16 MED FILL — LEVOTHYROXINE SODIUM 200 MC: 200 | 90 days supply | Qty: 90 | Fill #0

## 2020-09-16 MED FILL — ESCITALOPRAM 10 MG TABLET: 10 | 90 days supply | Qty: 90 | Fill #0

## 2020-11-01 ENCOUNTER — Other Ambulatory Visit (HOSPITAL_COMMUNITY): Payer: Self-pay | Admitting: Internal Medicine

## 2020-11-01 MED FILL — LISINOPRIL-HCTZ 10-12.5 MG: 10-12.5 | 90 days supply | Qty: 90 | Fill #0

## 2020-11-01 MED FILL — CYCLOBENZAPRINE HCL 10 MG T: 10 | 90 days supply | Qty: 90 | Fill #1

## 2020-12-31 ENCOUNTER — Encounter: Payer: No Typology Code available for payment source | Admitting: Nurse Practitioner

## 2021-01-07 MED FILL — ESCITALOPRAM 10 MG TABLET: 10 | 90 days supply | Qty: 90 | Fill #1

## 2021-01-15 ENCOUNTER — Ambulatory Visit (INDEPENDENT_AMBULATORY_CARE_PROVIDER_SITE_OTHER): Payer: No Typology Code available for payment source | Admitting: Nurse Practitioner

## 2021-01-15 ENCOUNTER — Other Ambulatory Visit: Payer: Self-pay

## 2021-01-15 ENCOUNTER — Encounter: Payer: Self-pay | Admitting: Nurse Practitioner

## 2021-01-15 VITALS — BP 130/80 | Ht 67.0 in | Wt 220.0 lb

## 2021-01-15 DIAGNOSIS — N920 Excessive and frequent menstruation with regular cycle: Secondary | ICD-10-CM | POA: Diagnosis not present

## 2021-01-15 DIAGNOSIS — E039 Hypothyroidism, unspecified: Secondary | ICD-10-CM

## 2021-01-15 DIAGNOSIS — Z8639 Personal history of other endocrine, nutritional and metabolic disease: Secondary | ICD-10-CM

## 2021-01-15 DIAGNOSIS — Z01419 Encounter for gynecological examination (general) (routine) without abnormal findings: Secondary | ICD-10-CM

## 2021-01-15 LAB — CBC WITH DIFFERENTIAL/PLATELET: Monocytes Relative: 7.6 %

## 2021-01-15 NOTE — Progress Notes (Signed)
UVA RUNKEL 04-Aug-1971 [id number]   History:  50 y.o. [id number] presents for annual exam. History of menorrhagia-has tried IUD with daily bleeding x 1 year and minimal improvement with Micronor and Lysteda.  She has been considering an ablation and wants to move forward with this. She is still having monthly cycles with heavy bleeding x 3 days with total bleeding time of 5 days. [facility] 02/2020 showed small fibroids and possible adenomyosis, otherwise unremarkable. Normal pap and mammogram history. BTL. History of Vitamin D deficiency and hypothyroidism. She has lost 10 pounds this month and is working with a Automotive engineer.   Gynecologic History LMP 12/28/2020 Period Cycle (Days): 28 Period Duration (Days): 5 Period Pattern: Regular Menstrual Flow: Moderate Menstrual Control: Maxi pad,Tampon Dysmenorrhea: (!) Mild Dysmenorrhea Symptoms: Cramping Contraception/Family planning: tubal ligation  Health Maintenance Last Pap: 12/27/2019. Results were: normal Last mammogram: 08/14/2020. Results were: normal Last colonoscopy: Never Last Dexa: N/A  Past medical history, past surgical history, family history and social history were all reviewed and documented in the EPIC chart.  ROS:  A ROS was performed and pertinent positives and negatives are included.  Exam:  Vitals:   01/15/21 0929  BP: 130/80  Weight: 220 lb (99.8 kg)  Height: 5\' 7"  (1.702 m)   Body mass index is 34.46 kg/m.  General appearance:  Normal Thyroid:  Symmetrical, normal in size, without palpable masses or nodularity. Respiratory  Auscultation:  Clear without wheezing or rhonchi Cardiovascular  Auscultation:  Regular rate, without rubs, murmurs or gallops  Edema/varicosities:  Not grossly evident Abdominal  Soft,nontender, without masses, guarding or rebound.  Liver/spleen:  No organomegaly noted  Hernia:  None appreciated  Skin  Inspection:  Grossly normal   Breasts: Examined lying and sitting.   Right: Without  masses, retractions, discharge or axillary adenopathy.   Left: Without masses, retractions, discharge or axillary adenopathy. Gentitourinary   Inguinal/mons:  Normal without inguinal adenopathy  External genitalia:  Normal  BUS/Urethra/Skene's glands:  Normal  Vagina:  Normal  Cervix:  Normal  Uterus:  Normal in size, shape and contour.  Midline and mobile  Adnexa/parametria:     Rt: Without masses or tenderness.   Lt: Without masses or tenderness.  Anus and perineum: Normal  Digital rectal exam: Normal sphincter tone without palpated masses or tenderness  Assessment/Plan:  50 y.o. [id number] for annual exam.   Well female exam with routine gynecological exam - Plan: CBC with Differential/Platelet, Comprehensive metabolic panel, Lipid panel. Education provided on SBEs, importance of preventative screenings, current guidelines, high calcium diet, regular exercise, and multivitamin daily. Congratulated on weight loss.   Menorrhagia with regular cycle - has tried IUD with daily bleeding x 1 year and minimal improvement with Micronor and Lysteda.  She has been considering an ablation and wants to move forward with this. She is still having monthly cycles with heavy bleeding x 3 days with total bleeding time of 5 days. We will schedule pre-op/discussion for endometrial ablation with Dr. Dellis Filbert. Education provided on procedure.  History of vitamin D deficiency - Plan: VITAMIN D 25 Hydroxy (Vit-D Deficiency, Fractures)  Hypothyroidism, unspecified type - Plan: TSH. PCP manages this but she has been unable to get labs done at their office due to long lines.   Screening for cervical cancer - Normal Pap history.  Will repeat at 5-year interval per guidelines.  Screening for breast cancer - Normal mammogram history.  Continue annual screenings.  Normal breast exam today.  Screening for colon cancer - Has  not has screening colonoscopy but plans to schedule this soon.   Follow up in 1 year for  annual.     [name] [facility], 9:45 AM 01/15/2021

## 2021-01-15 NOTE — Patient Instructions (Signed)
Endometrial Ablation Endometrial ablation is a procedure that destroys the thin inner layer of the lining of the uterus (endometrium). This procedure may be done:  To stop heavy menstrual periods.  To stop bleeding that is causing anemia.  To control irregular bleeding.  To treat bleeding caused by small tumors (fibroids) in the endometrium. This procedure is often done as an alternative to major surgery, such as removal of the uterus and cervix (hysterectomy). As a result of this procedure:  You may not be able to have children. However, if you have not yet gone through menopause: ? You may still have a small chance of getting pregnant. ? You will need to use a reliable method of birth control after the procedure to prevent pregnancy.  You may stop having a menstrual period, or you may have only a small amount of bleeding during your period. Menstruation may return several years after the procedure. Tell a health care provider about:  Any allergies you have.  All medicines you are taking, including vitamins, herbs, eye drops, creams, and over-the-counter medicines.  Any problems you or family members have had with the use of anesthetic medicines.  Any blood disorders you have.  Any surgeries you have had.  Any medical conditions you have.  Whether you are pregnant or may be pregnant. What are the risks? Generally, this is a safe procedure. However, problems may occur, including:  A hole (perforation) in the uterus or bowel.  Infection in the uterus, bladder, or vagina.  Bleeding.  Allergic reaction to medicines.  Damage to nearby structures or organs.  An air bubble in the lung (air embolus).  Problems with pregnancy.  Failure of the procedure.  Decreased ability to diagnose cancer in the endometrium. Scar tissue forms after the procedure, making it more difficult to get a sample of the uterine lining. What happens before the procedure? Medicines Ask your health  care provider about:  Changing or stopping your regular medicines. This is especially important if you take diabetes medicines or blood thinners.  Taking medicines such as aspirin and ibuprofen. These medicines can thin your blood. Do not take these medicines before your procedure if your doctor tells you not to take them.  Taking over-the-counter medicines, vitamins, herbs, and supplements. Tests  You will have tests of your endometrium to make sure there are no precancerous cells or cancer cells present.  You may have an ultrasound of the uterus. General instructions  Do not use any products that contain nicotine or tobacco for at least 4 weeks before the procedure. These include cigarettes, chewing tobacco, and vaping devices, such as e-cigarettes. If you need help quitting, ask your health care provider.  You may be given medicines to thin the endometrium.  Ask your health care provider what steps will be taken to help prevent infection. These steps may include: ? Removing hair at the surgery site. ? Washing skin with a germ-killing soap. ? Taking antibiotic medicine.  Plan to have a responsible adult take you home from the hospital or clinic.  Plan to have a responsible adult care for you for the time you are told after you leave the hospital or clinic. This is important. What happens during the procedure?  You will lie on an exam table with your feet and legs supported as in a pelvic exam.  An IV will be inserted into one of your veins.  You will be given a medicine to help you relax (sedative).  A surgical tool with   a light and camera (resectoscope) will be inserted into your vagina and moved into your uterus. This allows your surgeon to see inside your uterus.  Endometrial tissue will be destroyed and removed, using one of the following methods: ? Radiofrequency. This uses an electrical current to destroy the endometrium. ? Cryotherapy. This uses extreme cold to freeze  the endometrium. ? Heated fluid. This uses a heated salt and water (saline) solution to destroy the endometrium. ? Microwave. This uses high-energy microwaves to heat up the endometrium and destroy it. ? Thermal balloon. This involves inserting a catheter with a balloon tip into the uterus. The balloon tip is filled with heated fluid to destroy the endometrium. The procedure may vary among health care providers and hospitals.   What happens after the procedure?  Your blood pressure, heart rate, breathing rate, and blood oxygen level will be monitored until you leave the hospital or clinic.  You may have vaginal bleeding for 4-6 weeks after the procedure. You may also have: ? Cramps. ? A thin, watery vaginal discharge that is light pink or brown. ? A need to urinate more than usual. ? Nausea.  If you were given a sedative during the procedure, it can affect you for several hours. Do not drive or operate machinery until your health care provider says that it is safe.  Do not have sex or insert anything into your vagina until your health care provider says it is safe. Summary  Endometrial ablation is done to treat many causes of heavy menstrual bleeding. The procedure destroys the thin inner layer of the lining of the uterus (endometrium).  This procedure is often done as an alternative to major surgery, such as removal of the uterus and cervix (hysterectomy).  Plan to have a responsible adult take you home from the hospital or clinic. This information is not intended to replace advice given to you by your health care provider. Make sure you discuss any questions you have with your health care provider. Document Revised: 06/20/2020 Document Reviewed: 06/20/2020 Elsevier Patient Education  [place] Maintenance, Female Adopting a healthy lifestyle and getting preventive care are important in promoting health and wellness. Ask your health care provider about:  The right  schedule for you to have regular tests and exams.  Things you can do on your own to prevent diseases and keep yourself healthy. What should I know about diet, weight, and exercise? Eat a healthy diet  Eat a diet that includes plenty of vegetables, fruits, low-fat dairy products, and lean protein.  Do not eat a lot of foods that are high in solid fats, added sugars, or sodium.   Maintain a healthy weight Body mass index (BMI) is used to identify weight problems. It estimates body fat based on height and weight. Your health care provider can help determine your BMI and help you achieve or maintain a healthy weight. Get regular exercise Get regular exercise. This is one of the most important things you can do for your health. Most adults should:  Exercise for at least 150 minutes each week. The exercise should increase your heart rate and make you sweat (moderate-intensity exercise).  Do strengthening exercises at least twice a week. This is in addition to the moderate-intensity exercise.  Spend less time sitting. Even light physical activity can be beneficial. Watch cholesterol and blood lipids Have your blood tested for lipids and cholesterol at 51 years of age, then have this test every 5 years. Have your cholesterol  levels checked more often if:  Your lipid or cholesterol levels are high.  You are older than 51 years of age.  You are at high risk for heart disease. What should I know about cancer screening? Depending on your health history and family history, you may need to have cancer screening at various ages. This may include screening for:  Breast cancer.  Cervical cancer.  Colorectal cancer.  Skin cancer.  Lung cancer. What should I know about heart disease, diabetes, and high blood pressure? Blood pressure and heart disease  High blood pressure causes heart disease and increases the risk of stroke. This is more likely to develop in people who have high blood  pressure readings, are of African descent, or are overweight.  Have your blood pressure checked: ? Every 3-5 years if you are 27-70 years of age. ? Every year if you are 37 years old or older. Diabetes Have regular diabetes screenings. This checks your fasting blood sugar level. Have the screening done:  Once every three years after age 62 if you are at a normal weight and have a low risk for diabetes.  More often and at a younger age if you are overweight or have a high risk for diabetes. What should I know about preventing infection? Hepatitis B If you have a higher risk for hepatitis B, you should be screened for this virus. Talk with your health care provider to find out if you are at risk for hepatitis B infection. Hepatitis C Testing is recommended for:  Everyone born from [birth year] through 1966.  Anyone with known risk factors for hepatitis C. Sexually transmitted infections (STIs)  Get screened for STIs, including gonorrhea and chlamydia, if: ? You are sexually active and are younger than 51 years of age. ? You are older than 51 years of age and your health care provider tells you that you are at risk for this type of infection. ? Your sexual activity has changed since you were last screened, and you are at increased risk for chlamydia or gonorrhea. Ask your health care provider if you are at risk.  Ask your health care provider about whether you are at high risk for HIV. Your health care provider may recommend a prescription medicine to help prevent HIV infection. If you choose to take medicine to prevent HIV, you should first get tested for HIV. You should then be tested every 3 months for as long as you are taking the medicine. Pregnancy  If you are about to stop having your period (premenopausal) and you may become pregnant, seek counseling before you get pregnant.  Take 400 to 800 micrograms (mcg) of folic acid every day if you become pregnant.  Ask for birth control  (contraception) if you want to prevent pregnancy. Osteoporosis and menopause Osteoporosis is a disease in which the bones lose minerals and strength with aging. This can result in bone fractures. If you are 19 years old or older, or if you are at risk for osteoporosis and fractures, ask your health care provider if you should:  Be screened for bone loss.  Take a calcium or vitamin D supplement to lower your risk of fractures.  Be given hormone replacement therapy (HRT) to treat symptoms of menopause. Follow these instructions at home: Lifestyle  Do not use any products that contain nicotine or tobacco, such as cigarettes, e-cigarettes, and chewing tobacco. If you need help quitting, ask your health care provider.  Do not use street drugs.  Do not  share needles.  Ask your health care provider for help if you need support or information about quitting drugs. Alcohol use  Do not drink alcohol if: ? Your health care provider tells you not to drink. ? You are pregnant, may be pregnant, or are planning to become pregnant.  If you drink alcohol: ? Limit how much you use to 0-1 drink a day. ? Limit intake if you are breastfeeding.  Be aware of how much alcohol is in your drink. In the [place] one drink equals one 12 oz bottle of beer (355 mL), one 5 oz glass of wine (148 mL), or one 1 oz glass of hard liquor (44 mL). General instructions  Schedule regular health, dental, and eye exams.  Stay current with your vaccines.  Tell your health care provider if: ? You often feel depressed. ? You have ever been abused or do not feel safe at home. Summary  Adopting a healthy lifestyle and getting preventive care are important in promoting health and wellness.  Follow your health care provider's instructions about healthy diet, exercising, and getting tested or screened for diseases.  Follow your health care provider's instructions on monitoring your cholesterol and blood pressure. This  information is not intended to replace advice given to you by your health care provider. Make sure you discuss any questions you have with your health care provider. Document Revised: 11/23/2018 Document Reviewed: 11/23/2018 Elsevier Patient Education  2021 [employer].

## 2021-01-16 ENCOUNTER — Encounter: Payer: Self-pay | Admitting: Nurse Practitioner

## 2021-01-16 ENCOUNTER — Other Ambulatory Visit: Payer: Self-pay | Admitting: Nurse Practitioner

## 2021-01-16 DIAGNOSIS — D500 Iron deficiency anemia secondary to blood loss (chronic): Secondary | ICD-10-CM

## 2021-01-16 DIAGNOSIS — N939 Abnormal uterine and vaginal bleeding, unspecified: Secondary | ICD-10-CM

## 2021-01-16 DIAGNOSIS — E039 Hypothyroidism, unspecified: Secondary | ICD-10-CM

## 2021-01-16 LAB — LIPID PANEL
Cholesterol: 149 mg/dL (ref <200)
HDL: 37 mg/dL — ABNORMAL LOW (ref >=50)
LDL Cholesterol (Calc): 90 mg/dL (calc)
Non-HDL Cholesterol (Calc): 112 mg/dL (calc) (ref <130)
Total CHOL/HDL Ratio: 4 (calc) (ref <5.0)
Triglycerides: 120 mg/dL (ref <150)

## 2021-01-16 LAB — CBC WITH DIFFERENTIAL/PLATELET
Absolute Monocytes: 494 cells/uL (ref 200–950)
Basophils Absolute: 72 cells/uL (ref 0–200)
Basophils Relative: 1.1 %
Eosinophils Absolute: 709 cells/uL — ABNORMAL HIGH (ref 15–500)
Eosinophils Relative: 10.9 %
HCT: 28.1 % — ABNORMAL LOW (ref 35.0–45.0)
Hemoglobin: 7.9 g/dL — ABNORMAL LOW (ref 11.7–15.5)
Lymphs Abs: 1593 cells/uL (ref 850–3900)
MCH: 18.2 pg — ABNORMAL LOW (ref 27.0–33.0)
MCHC: 28.1 g/dL — ABNORMAL LOW (ref 32.0–36.0)
MCV: 64.9 fL — ABNORMAL LOW (ref 80.0–100.0)
MPV: 9.5 fL (ref 7.5–12.5)
Neutro Abs: 3634 cells/uL (ref 1500–7800)
Neutrophils Relative %: 55.9 %
Platelets: 363 10*3/uL (ref 140–400)
RBC: 4.33 10*6/uL (ref 3.80–5.10)
RDW: 18.2 % — ABNORMAL HIGH (ref 11.0–15.0)
Total Lymphocyte: 24.5 %
WBC: 6.5 10*3/uL (ref 3.8–10.8)

## 2021-01-16 LAB — COMPREHENSIVE METABOLIC PANEL
AG Ratio: 1.9 (calc) (ref 1.0–2.5)
ALT: 46 U/L — ABNORMAL HIGH (ref 6–29)
AST: 34 U/L (ref 10–35)
Albumin: 4.5 g/dL (ref 3.6–5.1)
Alkaline phosphatase (APISO): 127 U/L (ref 37–153)
BUN: 22 mg/dL (ref 7–25)
CO2: 24 mmol/L (ref 20–32)
Calcium: 9.6 mg/dL (ref 8.6–10.4)
Chloride: 105 mmol/L (ref 98–110)
Creat: 0.83 mg/dL (ref 0.50–1.05)
Globulin: 2.4 g/dL (calc) (ref 1.9–3.7)
Glucose, Bld: 102 mg/dL — ABNORMAL HIGH (ref 65–99)
Potassium: 4.5 mmol/L (ref 3.5–5.3)
Sodium: 140 mmol/L (ref 135–146)
Total Bilirubin: 0.3 mg/dL (ref 0.2–1.2)
Total Protein: 6.9 g/dL (ref 6.1–8.1)

## 2021-01-16 LAB — TSH: TSH: 0.04 mIU/L — ABNORMAL LOW

## 2021-01-16 LAB — VITAMIN D 25 HYDROXY (VIT D DEFICIENCY, FRACTURES): Vit D, 25-Hydroxy: 50 ng/mL (ref 30–100)

## 2021-01-16 LAB — CBC MORPHOLOGY

## 2021-01-16 MED ORDER — MEGESTROL ACETATE 40 MG PO TABS: 40.0000 mg | ORAL_TABLET | Freq: Two times a day (BID) | ORAL | 2 refills | Status: DC

## 2021-01-16 MED ORDER — FERROUS SULFATE 325 (65 FE) MG PO TBEC
325.0000 mg | DELAYED_RELEASE_TABLET | Freq: Two times a day (BID) | ORAL | 0 refills | Status: DC
Start: 2021-01-16 — End: 2021-01-16

## 2021-01-16 MED ORDER — LEVOTHYROXINE SODIUM 175 MCG PO TABS: 175.0000 ug | ORAL_TABLET | Freq: Every day | ORAL | 0 refills | Status: DC

## 2021-01-16 MED FILL — LEVOTHYROXINE 175 MCG TABLE: 175 | 90 days supply | Qty: 90 | Fill #0

## 2021-01-16 MED FILL — MEGESTROL 40 MG TABLET: 40 | 10 days supply | Qty: 20 | Fill #0

## 2021-01-16 NOTE — Progress Notes (Signed)
Hemoglobin 7.9 with abnormal morphology. Patient asymptomatic. Will supplement with Ferrous Sulfate 325 mg TID and recheck prior to endometrial ablation. If morphology still abnormal at that time we will send referral to hematology. TSH low, will decrease Levothyroxine to 175 mcg daily and recheck TSH in 8 weeks. Megace as needed for bleeding to prevent worsening anemia. Will schedule ultrasound prior to pre-op appointment with Dr. Dellis Filbert.

## 2021-01-16 NOTE — Telephone Encounter (Signed)
I spoke with patient per Marny Lowenstein, NP.  U/s will be coordinated with Pre op visit. Staff message sent to [facility] to check benefits and schedule.

## 2021-01-20 ENCOUNTER — Other Ambulatory Visit (HOSPITAL_COMMUNITY): Payer: Self-pay | Admitting: Internal Medicine

## 2021-01-20 MED FILL — VALSARTAN-HCTZ 160-12.5 MG: 160-12.5 | 90 days supply | Qty: 90 | Fill #0

## 2021-01-23 ENCOUNTER — Other Ambulatory Visit: Payer: Self-pay

## 2021-01-23 ENCOUNTER — Ambulatory Visit (INDEPENDENT_AMBULATORY_CARE_PROVIDER_SITE_OTHER): Payer: No Typology Code available for payment source

## 2021-01-23 ENCOUNTER — Ambulatory Visit: Payer: No Typology Code available for payment source | Admitting: Nurse Practitioner

## 2021-01-23 ENCOUNTER — Encounter: Payer: Self-pay | Admitting: Nurse Practitioner

## 2021-01-23 VITALS — BP 123/84

## 2021-01-23 DIAGNOSIS — D500 Iron deficiency anemia secondary to blood loss (chronic): Secondary | ICD-10-CM

## 2021-01-23 DIAGNOSIS — D251 Intramural leiomyoma of uterus: Secondary | ICD-10-CM | POA: Diagnosis not present

## 2021-01-23 DIAGNOSIS — N939 Abnormal uterine and vaginal bleeding, unspecified: Secondary | ICD-10-CM

## 2021-01-23 DIAGNOSIS — N920 Excessive and frequent menstruation with regular cycle: Secondary | ICD-10-CM | POA: Diagnosis not present

## 2021-01-23 NOTE — Progress Notes (Signed)
History: 50 year old presents today to discuss ultrasound.  History of menorrhagia and has tried IUD with daily bleeding x1 year and minimal improvement with Micronor and Lysteda.  She has been considering an ablation and wants to move forward with this.  She is still having monthly cycles with heavy bleeding x3 days with total bleeding time of 5 days.  Hemoglobin 7.9 on 01/15/2021 - started iron supplements 3 times daily. Megace provided and she will take if she has any bleeding to avoid worsening anemia.  Exam: Appears well Ultrasound: Retroverted uterus, bulky, fibroids x2, anterior to endometrium - 20 x 25 mm with 2 mm calcification, posterior to endometrium - 21 x 21 mm, both intramural.  Thin, symmetrical endometrium - 1.93 mm.  Anterior fibroid appears to distort endometrium. Both ovaries normal size, right ovary with dominant follicle.  No adnexal masses, no free fluid.  Assessment: Menorrhagia with regular cycle Iron deficiency anemia due to chronic blood loss Fibroids, intramural  Plan: Discussed ultrasound findings. She has pre-op appointment scheduled with Dr. Dellis Filbert 02/03/2021 to discuss endometrial ablation. She will continue iron supplements and take Megace if needed to avoid further bleeding. She is agreeable to plan.

## 2021-01-23 NOTE — Patient Instructions (Signed)
Endometrial Ablation Endometrial ablation is a procedure that destroys the thin inner layer of the lining of the uterus (endometrium). This procedure may be done:  To stop heavy menstrual periods.  To stop bleeding that is causing anemia.  To control irregular bleeding.  To treat bleeding caused by small tumors (fibroids) in the endometrium. This procedure is often done as an alternative to major surgery, such as removal of the uterus and cervix (hysterectomy). As a result of this procedure:  You may not be able to have children. However, if you have not yet gone through menopause: ? You may still have a small chance of getting pregnant. ? You will need to use a reliable method of birth control after the procedure to prevent pregnancy.  You may stop having a menstrual period, or you may have only a small amount of bleeding during your period. Menstruation may return several years after the procedure. Tell a health care provider about:  Any allergies you have.  All medicines you are taking, including vitamins, herbs, eye drops, creams, and over-the-counter medicines.  Any problems you or family members have had with the use of anesthetic medicines.  Any blood disorders you have.  Any surgeries you have had.  Any medical conditions you have.  Whether you are pregnant or may be pregnant. What are the risks? Generally, this is a safe procedure. However, problems may occur, including:  A hole (perforation) in the uterus or bowel.  Infection in the uterus, bladder, or vagina.  Bleeding.  Allergic reaction to medicines.  Damage to nearby structures or organs.  An air bubble in the lung (air embolus).  Problems with pregnancy.  Failure of the procedure.  Decreased ability to diagnose cancer in the endometrium. Scar tissue forms after the procedure, making it more difficult to get a sample of the uterine lining. What happens before the procedure? Medicines Ask your health  care provider about:  Changing or stopping your regular medicines. This is especially important if you take diabetes medicines or blood thinners.  Taking medicines such as aspirin and ibuprofen. These medicines can thin your blood. Do not take these medicines before your procedure if your doctor tells you not to take them.  Taking over-the-counter medicines, vitamins, herbs, and supplements. Tests  You will have tests of your endometrium to make sure there are no precancerous cells or cancer cells present.  You may have an ultrasound of the uterus. General instructions  Do not use any products that contain nicotine or tobacco for at least 4 weeks before the procedure. These include cigarettes, chewing tobacco, and vaping devices, such as e-cigarettes. If you need help quitting, ask your health care provider.  You may be given medicines to thin the endometrium.  Ask your health care provider what steps will be taken to help prevent infection. These steps may include: ? Removing hair at the surgery site. ? Washing skin with a germ-killing soap. ? Taking antibiotic medicine.  Plan to have a responsible adult take you home from the hospital or clinic.  Plan to have a responsible adult care for you for the time you are told after you leave the hospital or clinic. This is important. What happens during the procedure?  You will lie on an exam table with your feet and legs supported as in a pelvic exam.  An IV will be inserted into one of your veins.  You will be given a medicine to help you relax (sedative).  A surgical tool with   a light and camera (resectoscope) will be inserted into your vagina and moved into your uterus. This allows your surgeon to see inside your uterus.  Endometrial tissue will be destroyed and removed, using one of the following methods: ? Radiofrequency. This uses an electrical current to destroy the endometrium. ? Cryotherapy. This uses extreme cold to freeze  the endometrium. ? Heated fluid. This uses a heated salt and water (saline) solution to destroy the endometrium. ? Microwave. This uses high-energy microwaves to heat up the endometrium and destroy it. ? Thermal balloon. This involves inserting a catheter with a balloon tip into the uterus. The balloon tip is filled with heated fluid to destroy the endometrium. The procedure may vary among health care providers and hospitals.   What happens after the procedure?  Your blood pressure, heart rate, breathing rate, and blood oxygen level will be monitored until you leave the hospital or clinic.  You may have vaginal bleeding for 4-6 weeks after the procedure. You may also have: ? Cramps. ? A thin, watery vaginal discharge that is light pink or brown. ? A need to urinate more than usual. ? Nausea.  If you were given a sedative during the procedure, it can affect you for several hours. Do not drive or operate machinery until your health care provider says that it is safe.  Do not have sex or insert anything into your vagina until your health care provider says it is safe. Summary  Endometrial ablation is done to treat many causes of heavy menstrual bleeding. The procedure destroys the thin inner layer of the lining of the uterus (endometrium).  This procedure is often done as an alternative to major surgery, such as removal of the uterus and cervix (hysterectomy).  Plan to have a responsible adult take you home from the hospital or clinic. This information is not intended to replace advice given to you by your health care provider. Make sure you discuss any questions you have with your health care provider. Document Revised: 06/20/2020 Document Reviewed: 06/20/2020 Elsevier Patient Education  2021 Elsevier Inc.       

## 2021-02-03 ENCOUNTER — Ambulatory Visit: Payer: No Typology Code available for payment source | Admitting: Obstetrics & Gynecology

## 2021-02-03 ENCOUNTER — Other Ambulatory Visit: Payer: Self-pay

## 2021-02-03 ENCOUNTER — Encounter: Payer: Self-pay | Admitting: Obstetrics & Gynecology

## 2021-02-03 VITALS — BP 124/82

## 2021-02-03 DIAGNOSIS — N920 Excessive and frequent menstruation with regular cycle: Secondary | ICD-10-CM

## 2021-02-03 DIAGNOSIS — N8000 Endometriosis of the uterus, unspecified: Secondary | ICD-10-CM

## 2021-02-03 DIAGNOSIS — D500 Iron deficiency anemia secondary to blood loss (chronic): Secondary | ICD-10-CM | POA: Diagnosis not present

## 2021-02-03 DIAGNOSIS — D251 Intramural leiomyoma of uterus: Secondary | ICD-10-CM | POA: Diagnosis not present

## 2021-02-04 ENCOUNTER — Telehealth: Payer: Self-pay

## 2021-02-04 ENCOUNTER — Encounter: Payer: Self-pay | Admitting: Obstetrics & Gynecology

## 2021-02-04 NOTE — Telephone Encounter (Signed)
Emily Bruins, MD  Ramond Craver, RMA  Severe menorrhagia   N92.0 secondary anemia   D50.0 Adenomyosis  N80.0 Fibroids  D25.1  XI Robotic TLH/Bilateral Salpingectomy  58571  2 hours   [facility]

## 2021-02-04 NOTE — Progress Notes (Signed)
    [name] 04-25-1971 [id number]        50 y.o.  [id number]   RP: Counseling and management of Menorrhagia with severe secondary anemia  HPI: History of menorrhagia and has tried IUD with daily bleeding x1 year and minimal improvement with Micronor and Lysteda. She is still having monthly cycles with heavy bleeding x3 days with total bleeding time of 5 days.  Severe dysmenorrhea.  Hemoglobin 7.9 on 01/15/2021 - started iron supplements 3 times daily. Megace prescribed on 01/23/2021 to start with next period.   OB History  Gravida Para Term Preterm AB Living  3 2 2   1 2   SAB IAB Ectopic Multiple Live Births    1          # Outcome Date GA Lbr Len/2nd Weight Sex Delivery Anes PTL Lv  3 Term           2 Term           1 IAB             Past medical history,surgical history, problem list, medications, allergies, family history and social history were all reviewed and documented in the EPIC chart.   Directed ROS with pertinent positives and negatives documented in the history of present illness/assessment and plan.  Exam:  Vitals:   02/03/21 1613  BP: 124/82   General appearance:  Normal  Pelvic US 01/23/2021:  Retroverted uterus, bulky, fibroids x2, anterior to endometrium - 20 x 25 mm with 2 mm calcification, posterior to endometrium - 21 x 21 mm, both intramural Thin, symmetrical endometrium - 1.93 mm.  Anterior fibroid appears to distort endometrium Both ovaries normal size, right ovary with dominant follicle No adnexal masses No free fluid    Assessment/Plan:  50 y.o. [id number]   1. Menorrhagia with regular cycle Severe menorrhagia refractory to medical and hormonal treatment.  Severe secondary anemia with a hemoglobin at 7.9.  Patient currently on iron supplement 3 times a day and will start Megace with next..  Per pelvic ultrasound probable adenomyosis and 2 intramural fibroids.  Given the adenomyosis with severe dysmenorrhea, decision to proceed with a hysterectomy  rather than endometrial ablation.  Different approaches reviewed with patient.  We will proceed with a robotic total laparoscopic hysterectomy with bilateral salpingectomy.  Surgery and risks reviewed with patient.  We will follow-up for a preop visit.  2. Iron deficiency anemia due to chronic blood loss On Iron supplements TID.  3. Fibroids, intramural 2 IM Fibroids 2.5 and 2.1 cm.  4. Uterus, adenomyosis Bulky uterus with severe dysmenorrhea.  Other orders - valsartan (DIOVAN) 160 MG tablet; Take 160 mg by mouth daily.  Princess Bruins MD, 4:32 AM 02/04/2021

## 2021-02-17 ENCOUNTER — Telehealth: Payer: Self-pay

## 2021-02-17 DIAGNOSIS — N939 Abnormal uterine and vaginal bleeding, unspecified: Secondary | ICD-10-CM

## 2021-02-17 NOTE — Telephone Encounter (Signed)
Patient sent a My Chart message on 02/17/21 "Hi Dr.Lavoie, I started my period on Saturday March 5th so I started the Megestrol.  How many days do I need to take it? It did stop my period. [name]!"   I wrote her back "Dr. Dellis Filbert did not specify length of time and she is out of the office this week. I know she does not want you to bleed. I would recommend you continue taking it this week and I will check with her on her return Monday. It is very possible that she intends to keep you on it until surgery to allow your iron level to recover."  Please advise. Thanks

## 2021-02-18 ENCOUNTER — Other Ambulatory Visit: Payer: Self-pay

## 2021-02-18 ENCOUNTER — Telehealth: Payer: Self-pay

## 2021-02-18 NOTE — Telephone Encounter (Signed)
I spoke with patient and scheduled XI Robotic TLH, Bilateral Salpingectomy for 04/08/21 at 7:30am at [facility] with Dr. Quincy Simmonds to assist Dr. Dellis Filbert.    Patient had Covid on 01/24/21 so no Covid test was scheduled per protocol.  [facility] pamphlet mailed to patient.  Patient was advised, Per Hayley, that surgeon's fees covered 100% so no prepayment due. [facility] will be contacting her about her facility charges.

## 2021-02-27 ENCOUNTER — Other Ambulatory Visit: Payer: Self-pay | Admitting: Obstetrics & Gynecology

## 2021-02-27 MED ORDER — MEGESTROL ACETATE 40 MG PO TABS: ORAL_TABLET | ORAL | 1 refills | Status: DC

## 2021-02-27 NOTE — Telephone Encounter (Signed)
Yes, continue Megestrol until surgery.  Can decrease to 1 tab daily if not currently bleeding.

## 2021-02-27 NOTE — Telephone Encounter (Signed)
Spoke with patient and informed her. Refill sent.

## 2021-02-28 ENCOUNTER — Other Ambulatory Visit (HOSPITAL_COMMUNITY): Payer: Self-pay | Admitting: Internal Medicine

## 2021-03-13 ENCOUNTER — Other Ambulatory Visit (INDEPENDENT_AMBULATORY_CARE_PROVIDER_SITE_OTHER): Payer: No Typology Code available for payment source

## 2021-03-13 ENCOUNTER — Other Ambulatory Visit: Payer: Self-pay

## 2021-03-13 DIAGNOSIS — E039 Hypothyroidism, unspecified: Secondary | ICD-10-CM

## 2021-03-13 LAB — TSH: TSH: 0.02 mIU/L — ABNORMAL LOW

## 2021-03-14 ENCOUNTER — Telehealth: Payer: Self-pay | Admitting: *Deleted

## 2021-03-14 ENCOUNTER — Other Ambulatory Visit: Payer: Self-pay | Admitting: Nurse Practitioner

## 2021-03-14 DIAGNOSIS — E039 Hypothyroidism, unspecified: Secondary | ICD-10-CM

## 2021-03-14 MED ORDER — LEVOTHYROXINE SODIUM 150 MCG PO TABS: 150.0000 ug | ORAL_TABLET | Freq: Every day | ORAL | 0 refills | Status: DC

## 2021-03-14 NOTE — Telephone Encounter (Signed)
Referral placed at [facility]. They will call patient to schedule.

## 2021-03-14 NOTE — Telephone Encounter (Signed)
-----   Message from [name], NP sent at 03/14/2021 12:58 PM EDT ----- Please send referral to endocrinology for management of hypothyroidism. I have sent her a mychart message regarding this.

## 2021-03-17 ENCOUNTER — Other Ambulatory Visit: Payer: Self-pay | Admitting: Nurse Practitioner

## 2021-03-17 ENCOUNTER — Other Ambulatory Visit (HOSPITAL_COMMUNITY): Payer: Self-pay

## 2021-03-17 MED FILL — Levothyroxine Sodium Tab 150 MCG: ORAL | 60 days supply | Qty: 60 | Fill #0 | Status: AC

## 2021-03-17 NOTE — Telephone Encounter (Signed)
Yes, if she is scheduled to see Dr. Dellis Filbert tomorrow they can discuss other options if appropriate. I recommend continuing the 2 tablets (and not taking a third) until she sees her. Thank you.

## 2021-03-17 NOTE — Telephone Encounter (Signed)
Not scheduled to see Dr. Dellis Filbert tomorrow. Do you think I should just have her take 3 tabs today and check with ML tomorrow when she is in office.?  She's not scheduled to see ML for pre op visit until 03/26/21.

## 2021-03-17 NOTE — Telephone Encounter (Signed)
Yes she can take one extra pill today and then discuss with Dr. Dellis Filbert tomorrow. Thank you.

## 2021-03-17 NOTE — Telephone Encounter (Signed)
Patient previously with hgb 7.9. Scheduled for Hysterectomy end of April.  Bleeding since 4/1 and Megace not stopping or really lightening it very much. Tampon every 2 hours.  She is taking Megestrol 40 bid.  Ok to wait for ML tomorrow to advise?

## 2021-03-18 ENCOUNTER — Other Ambulatory Visit (HOSPITAL_COMMUNITY): Payer: Self-pay

## 2021-03-18 ENCOUNTER — Other Ambulatory Visit: Payer: Self-pay

## 2021-03-18 DIAGNOSIS — N939 Abnormal uterine and vaginal bleeding, unspecified: Secondary | ICD-10-CM

## 2021-03-18 MED ORDER — MEGESTROL ACETATE 40 MG PO TABS
40.0000 mg | ORAL_TABLET | Freq: Three times a day (TID) | ORAL | 0 refills | Status: DC
  Filled 2021-03-18: qty 60, 30d supply, fill #0
  Filled 2021-06-02: qty 60, 20d supply, fill #0

## 2021-03-19 ENCOUNTER — Other Ambulatory Visit (HOSPITAL_COMMUNITY): Payer: Self-pay

## 2021-03-19 NOTE — Telephone Encounter (Signed)
Patient scheduled on 05/02/21 with Dr.Ellison

## 2021-03-20 ENCOUNTER — Other Ambulatory Visit (HOSPITAL_COMMUNITY): Payer: Self-pay

## 2021-03-20 MED ORDER — VALSARTAN-HYDROCHLOROTHIAZIDE 320-25 MG PO TABS
1.0000 | ORAL_TABLET | Freq: Every day | ORAL | 4 refills | Status: DC
  Filled 2021-03-20 – 2021-07-22 (×2): qty 90, 90d supply, fill #0
  Filled 2021-11-11: qty 90, 90d supply, fill #1
  Filled 2022-02-12: qty 90, 90d supply, fill #2

## 2021-03-26 ENCOUNTER — Other Ambulatory Visit: Payer: Self-pay

## 2021-03-26 ENCOUNTER — Encounter: Payer: Self-pay | Admitting: Obstetrics & Gynecology

## 2021-03-26 ENCOUNTER — Ambulatory Visit: Payer: No Typology Code available for payment source | Admitting: Obstetrics & Gynecology

## 2021-03-26 VITALS — BP 130/82

## 2021-03-26 DIAGNOSIS — D251 Intramural leiomyoma of uterus: Secondary | ICD-10-CM | POA: Diagnosis not present

## 2021-03-26 DIAGNOSIS — D500 Iron deficiency anemia secondary to blood loss (chronic): Secondary | ICD-10-CM

## 2021-03-26 DIAGNOSIS — N920 Excessive and frequent menstruation with regular cycle: Secondary | ICD-10-CM | POA: Diagnosis not present

## 2021-03-26 LAB — CBC
HCT: 41.7 % (ref 35.0–45.0)
Hemoglobin: 13.2 g/dL (ref 11.7–15.5)
MCH: 25.7 pg — ABNORMAL LOW (ref 27.0–33.0)
MCHC: 31.7 g/dL — ABNORMAL LOW (ref 32.0–36.0)
MCV: 81.1 fL (ref 80.0–100.0)
MPV: 9.4 fL (ref 7.5–12.5)
Platelets: 323 10*3/uL (ref 140–400)
RBC: 5.14 10*6/uL — ABNORMAL HIGH (ref 3.80–5.10)
WBC: 8.4 10*3/uL (ref 3.8–10.8)

## 2021-03-26 NOTE — Progress Notes (Signed)
    [name] 05-14-1971 [id number]        50 y.o.  [id number]   GH:WEXHB for XI Robotic TLH/Bilateral Salpingectomy d/t Menorrhagia/Anemia/Fibroids   HPI:  History of menorrhagia and has tried IUD with daily bleeding x1 year and minimal improvement with Micronor and Lysteda. She is still having monthly cycles with heavy bleeding x3 days with total bleeding time of 5 days.  Severe dysmenorrhea. Hemoglobin 7.9 on 01/15/2021 - started iron supplements 3 timesdaily.Megace prescribed on 01/23/2021 to start with next period.    OB History  Gravida Para Term Preterm AB Living  3 2 2   1 2   SAB IAB Ectopic Multiple Live Births    1          # Outcome Date GA Lbr Len/2nd Weight Sex Delivery Anes PTL Lv  3 Term           2 Term           1 IAB             Past medical history,surgical history, problem list, medications, allergies, family history and social history were all reviewed and documented in the EPIC chart.   Directed ROS with pertinent positives and negatives documented in the history of present illness/assessment and plan.  Exam:  Vitals:   03/26/21 1523  BP: 130/82   General appearance:  Normal  Pelvic US 01/23/2021:  Retroverted uterus, bulky, fibroids x2, anterior to endometrium - 20 x 25 mm with 2 mm calcification, posterior to endometrium - 21 x 21 mm, both intramural Thin, symmetrical endometrium - 1.93 mm. Anterior fibroid appears to distort endometrium Both ovaries normal size, right ovary with dominant follicle No adnexal masses No free fluid      Assessment/Plan:  50 y.o. [id number]   1. Menorrhagia with regular cycle Severe menorrhagia with anemia.  Last hemoglobin at 7.9 January 15, 2021.  Patient has been taking iron supplements and Megace.  Less bleeding since then.  Will recheck a hemoglobin today.  Continue with Megace and iron supplements. - CBC  2. Fibroids, intramural Small intramural fibroids with a bulky uterus compatible with adenomyosis.   Decision to proceed with XI robotic total laparoscopic hysterectomy with bilateral salpingectomy.  Preop preparation, procedure with risks and benefits as well as postop precautions thoroughly reviewed with patient.  Patient voiced understanding and agreement.  3. Iron deficiency anemia due to chronic blood loss Continue with iron supplements. - CBC                        Patient was counseled as to the risk of surgery to include the following:  1. Infection (prohylactic antibiotics will be administered)  2. DVT/Pulmonary Embolism (prophylactic pneumo compression stockings will be used)  3.Trauma to internal organs requiring additional surgical procedure to repair any injury to internal organs requiring perhaps additional hospitalization days.  4.Hemmorhage requiring transfusion and blood products which carry risks such as anaphylactic reaction, hepatitis and AIDS  Patient had received literature information on the procedure scheduled and all her questions were answered and fully accepts all risk.   Princess Bruins MD, 3:34 PM 03/26/2021

## 2021-04-02 ENCOUNTER — Encounter (HOSPITAL_BASED_OUTPATIENT_CLINIC_OR_DEPARTMENT_OTHER): Payer: Self-pay | Admitting: Obstetrics & Gynecology

## 2021-04-02 ENCOUNTER — Other Ambulatory Visit: Payer: Self-pay

## 2021-04-02 NOTE — Progress Notes (Signed)
Spoke w/ via phone for pre-op interview---pt Lab needs dos----   Urine preg            Lab results------has lab appt 04-04-2021 800 am for cbc bmp t & s and ekg COVID test ------04-04-2021 855 Arrive at -------530 am 04-08-2021 NPO after MN NO Solid Food.  Clear liquids from MN until---430 am then npo Med rec completed Medications to take morning of surgery -----megace prn, levothyroxine Diabetic medication -----n/a Patient instructed to bring photo id and insurance card day of surgery Patient aware to have Driver (ride ) / caregiver  Spouse mike will stay for 24 hours after surgery  Patient Special Instructions -----pt given extended stay instructions Pre-Op special Istructions -----none Patient verbalized understanding of instructions that were given at this phone interview. Patient denies shortness of breath, chest pain, fever, cough at this phone interview.

## 2021-04-02 NOTE — Progress Notes (Addendum)
YOU ARE SCHEDULED FOR A COVID TEST 04-04-2021 @ 855  AM. THIS TEST MUST BE DONE BEFORE SURGERY. GO TO  [place] [place], [place], IT IS APPROXIMATELY 2 MINUTES PAST [employer] ON THE RIGHT AND REMAIN IN YOUR CAR, THIS IS A DRIVE UP TEST. ONCE YOUR COVID TEST IS DONE PLEASE FOLLOW ALL THE QUARANTINE  INSTRUCTIONS GIVEN IN YOUR HANDOUT.      Your procedure is scheduled on 04-08-2021 Report to [facility].   Call this number if you have problems the morning of surgery  :[phone].   OUR ADDRESS IS [address].  WE ARE LOCATED IN [facility].  PLEASE BRING YOUR INSURANCE CARD AND PHOTO ID DAY OF SURGERY.  ONLY ONE PERSON ALLOWED IN FACILITY WAITING AREA.                                     REMEMBER:  DO NOT EAT FOOD, CANDY GUM OR MINTS  AFTER MIDNIGHT . YOU MAY HAVE CLEAR LIQUIDS FROM MIDNIGHT UNTIL 430 AM. NO CLEAR LIQUIDS AFTER 430 AM DAY OF SURGERY.   YOU MAY  BRUSH YOUR TEETH MORNING OF SURGERY AND RINSE YOUR MOUTH OUT, NO CHEWING GUM CANDY OR MINTS.    CLEAR LIQUID DIET   Foods Allowed                                                                     Foods Excluded  Coffee and tea, regular and decaf                             liquids that you cannot  Plain Jell-O any favor except red or purple                                           see through such as: Fruit ices (not with fruit pulp)                                     milk, soups, orange juice  Iced Popsicles                                    All solid food Carbonated beverages, regular and diet                                    Cranberry, grape and apple juices Sports drinks like Gatorade Lightly seasoned clear broth or consume(fat free) Sugar, honey syrup  Sample Menu Breakfast                                Lunch  Supper Cranberry juice                    Beef broth                            Chicken  broth Jell-O                                     Grape juice                           Apple juice Coffee or tea                        Jell-O                                      Popsicle                                                Coffee or tea                        Coffee or tea  _____________________________________________________________________     TAKE THESE MEDICATIONS MORNING OF SURGERY WITH A SIP OF WATER:  LEVOTHYROXINE, MEGACE IF NEEDED.  ONE VISITOR IS ALLOWED IN WAITING ROOM ONLY DAY OF SURGERY.  NO VISITOR MAY SPEND THE NIGHT.  VISITOR ARE ALLOWED TO STAY UNTIL 800 PM.                                    DO NOT WEAR JEWERLY, MAKE UP. DO NOT WEAR LOTIONS, POWDERS, PERFUMES OR DEODORANT. DO NOT SHAVE FOR 24 HOURS PRIOR TO DAY OF SURGERY. MEN MAY SHAVE FACE AND NECK. CONTACTS, GLASSES, OR DENTURES MAY NOT BE WORN TO SURGERY.                                    [facility] IS NOT RESPONSIBLE  FOR ANY BELONGINGS.                                                                    Marland Kitchen           [place] - Preparing for Surgery Before surgery, you can play an important role.  Because skin is not sterile, your skin needs to be as free of germs as possible.  You can reduce the number of germs on your skin by washing with CHG (chlorahexidine gluconate) soap before surgery.  CHG is an antiseptic cleaner which kills germs and bonds with the skin to continue killing germs even after washing. Please DO NOT use if you have an allergy to CHG or antibacterial soaps.  If your skin  becomes reddened/irritated stop using the CHG and inform your nurse when you arrive at Short Stay. Do not shave (including legs and underarms) for at least 48 hours prior to the first CHG shower.  You may shave your face/neck. Please follow these instructions carefully:  1.  Shower with CHG Soap the night before surgery and the  morning of Surgery.  2.  If you choose to wash your hair, wash your hair first as  usual with your  normal  shampoo.  3.  After you shampoo, rinse your hair and body thoroughly to remove the  shampoo.                            4.  Use CHG as you would any other liquid soap.  You can apply chg directly  to the skin and wash                      Gently with a scrungie or clean washcloth.  5.  Apply the CHG Soap to your body ONLY FROM THE NECK DOWN.   Do not use on face/ open                           Wound or open sores. Avoid contact with eyes, ears mouth and genitals (private parts).                       Wash face,  Genitals (private parts) with your normal soap.             6.  Wash thoroughly, paying special attention to the area where your surgery  will be performed.  7.  Thoroughly rinse your body with warm water from the neck down.  8.  DO NOT shower/wash with your normal soap after using and rinsing off  the CHG Soap.                9.  Pat yourself dry with a clean towel.            10.  Wear clean pajamas.            11.  Place clean sheets on your bed the night of your first shower and do not  sleep with pets. Day of Surgery : Do not apply any lotions/deodorants the morning of surgery.  Please wear clean clothes to the hospital/surgery center.  FAILURE TO FOLLOW THESE INSTRUCTIONS MAY RESULT IN THE CANCELLATION OF YOUR SURGERY PATIENT SIGNATURE_________________________________  NURSE SIGNATURE__________________________________  ________________________________________________________________________                                                        QUESTIONS Hansel Feinstein PRE OP NURSE PHONE [phone]

## 2021-04-04 ENCOUNTER — Other Ambulatory Visit: Payer: Self-pay

## 2021-04-04 ENCOUNTER — Other Ambulatory Visit (HOSPITAL_COMMUNITY)
Admission: RE | Admit: 2021-04-04 | Discharge: 2021-04-04 | Disposition: A | Discharge status: Home / Self Care | Payer: No Typology Code available for payment source | Source: Ambulatory Visit | Attending: Obstetrics & Gynecology | Admitting: Obstetrics & Gynecology

## 2021-04-04 ENCOUNTER — Encounter (HOSPITAL_COMMUNITY)
Admission: RE | Admit: 2021-04-04 | Discharge: 2021-04-04 | Disposition: A | Discharge status: Home / Self Care | Payer: No Typology Code available for payment source | Source: Ambulatory Visit | Attending: Obstetrics & Gynecology | Admitting: Obstetrics & Gynecology

## 2021-04-04 DIAGNOSIS — U071 COVID-19: Secondary | ICD-10-CM | POA: Diagnosis not present

## 2021-04-04 DIAGNOSIS — Z01818 Encounter for other preprocedural examination: Secondary | ICD-10-CM | POA: Insufficient documentation

## 2021-04-04 HISTORY — DX: COVID-19: U07.1

## 2021-04-04 LAB — CBC
HCT: 42.7 % (ref 36.0–46.0)
Hemoglobin: 13.9 g/dL (ref 12.0–15.0)
MCH: 26.7 pg (ref 26.0–34.0)
MCHC: 32.6 g/dL (ref 30.0–36.0)
MCV: 82 fL (ref 80.0–100.0)
Platelets: 346 10*3/uL (ref 150–400)
RBC: 5.21 MIL/uL — ABNORMAL HIGH (ref 3.87–5.11)
RDW: 21.4 % — ABNORMAL HIGH (ref 11.5–15.5)
WBC: 7.8 10*3/uL (ref 4.0–10.5)
nRBC: 0 % (ref 0.0–0.2)

## 2021-04-04 LAB — BASIC METABOLIC PANEL
Anion gap: 9 (ref 5–15)
BUN: 20 mg/dL (ref 6–20)
CO2: 23 mmol/L (ref 22–32)
Calcium: 10.1 mg/dL (ref 8.9–10.3)
Chloride: 108 mmol/L (ref 98–111)
Creatinine, Ser: 0.87 mg/dL (ref 0.44–1.00)
GFR, Estimated: >60 mL/min (ref >60)
Glucose, Bld: 104 mg/dL — ABNORMAL HIGH (ref 70–99)
Potassium: 3.9 mmol/L (ref 3.5–5.1)
Sodium: 140 mmol/L (ref 135–145)

## 2021-04-04 LAB — SARS CORONAVIRUS 2 (TAT 6-24 HRS): SARS Coronavirus 2: POSITIVE — AB

## 2021-04-04 NOTE — Progress Notes (Signed)
Pt blood pressure 160/102 at pre op visit, pt states pcp just increased blood pressure medication 2 weeks ago, pt will take valsartan at hs night before surgery, pt vocalized understanding

## 2021-04-05 ENCOUNTER — Telehealth: Payer: Self-pay | Admitting: Obstetrics and Gynecology

## 2021-04-05 NOTE — Telephone Encounter (Signed)
Received call from hospital regarding patient's positive covid test. Called patient and relayed results and informed her that her surgery will need to be rescheduled at least 10 days out after her positive test. Patient expressed understanding. Will forward message to Dr Dellis Filbert to reschedule.   Emily Folds, MD

## 2021-04-07 ENCOUNTER — Telehealth (HOSPITAL_COMMUNITY): Payer: Self-pay

## 2021-04-07 NOTE — Telephone Encounter (Signed)
04/07/21@8 :34am - Contacted Dr. Dellis Filbert - advised of patient's Positive COVID19 lab results. MBM

## 2021-04-08 LAB — TYPE AND SCREEN
ABO/RH(D): O POS
Antibody Screen: NEGATIVE

## 2021-04-09 ENCOUNTER — Other Ambulatory Visit: Payer: Self-pay

## 2021-04-09 ENCOUNTER — Telehealth: Payer: Self-pay

## 2021-04-09 DIAGNOSIS — R9431 Abnormal electrocardiogram [ECG] [EKG]: Secondary | ICD-10-CM

## 2021-04-09 NOTE — Telephone Encounter (Signed)
I called patient to follow up after her surgery was cancelled yesterday due to pos Covid test. She had Covid 01/24/21 and had checked at home Covid test and was pos and was sick.  She said [facility] did not honor that test since not in the system and retested her and she tested pos. She is not sick at all.    She let me know that she has been diagnosed with severe sleep apnea and is proceeding with getting that checked out and has had a sleep study and will see MD in May.  I discussed with her that her pre op EKG was abnormal and Dr. Marguerita Merles recommended cardiology clearance. I scheduled her with Dr. Minus Breeding for 5/27 at 11:40am at [facility] office.  I rescheduled her surgery to 06/10/21 at 7:30am at [facility]. Dr. Quincy Simmonds will assist.

## 2021-04-09 NOTE — Progress Notes (Signed)
car

## 2021-04-11 ENCOUNTER — Encounter: Payer: Self-pay | Admitting: Endocrinology

## 2021-04-11 ENCOUNTER — Other Ambulatory Visit: Payer: Self-pay

## 2021-04-11 ENCOUNTER — Ambulatory Visit: Payer: No Typology Code available for payment source | Admitting: Endocrinology

## 2021-04-11 DIAGNOSIS — E042 Nontoxic multinodular goiter: Secondary | ICD-10-CM

## 2021-04-11 LAB — T4, FREE: Free T4: 0.81 ng/dL (ref 0.60–1.60)

## 2021-04-11 LAB — TSH: TSH: 0.75 u[IU]/mL (ref 0.35–4.50)

## 2021-04-11 NOTE — Progress Notes (Signed)
Subjective:    Patient ID: [name], female    DOB: 1971-04-28, 50 y.o.   MRN: [id number]  HPI Pt is referred by Marny Lowenstein, NP, for hypothyroidism.  Pt reports hypothyroidism was dx'ed in 1994.  she has been on prescribed thyroid hormone therapy since dx.  Synthroid was reduced to its current 150 mcg/d, 4 weeks ago. she has never taken kelp or any other type of non-prescribed thyroid product.  She has never had thyroid surgery, or XRT to the neck.  she has never been on amiodarone or lithium.  Main symptoms are fatigue and dry skin.   Past Medical History:  Diagnosis Date  . Abnormal uterine bleeding (AUB) 10/11/2015  . ADD (attention deficit disorder)   . Anxiety   . Arthritis    oa  . COVID feb 16th or feb 17th   rapid home test cold symptoms x 4 days all symptoms resolved  . Elevated TSH since jan 2022   thryoid med adjusted see dr Loanne Drilling 05-02-2021  . Fatty liver    no problems in several yrs as of 04-02-21 per pt  . GERD (gastroesophageal reflux disease)   . Heart murmur    "mild no cardiologist" saw heart md in 2015 and told was mild  . Heavy periods   . History of sleep study week of april 10th 2022 at dentist   waiting to see dr dohmier for sleep study   . Hypertension   . Hypothyroidism   . Iron deficiency anemia   . Joint pain   . Thyroid nodule    US done 2013 sees dr Ebony Hail may 20th 2022 for not sure which side no swallowing problems  . Vitamin D deficiency     Past Surgical History:  Procedure Laterality Date  . JOINT REPLACEMENT    . tooth implant  2016  . TOTAL KNEE ARTHROPLASTY Left 12/27/2018   Procedure: LEFT TOTAL KNEE ARTHROPLASTY;  Surgeon: Mcarthur Rossetti, MD;  Location: [facility];  Service: Orthopedics;  Laterality: Left;  . TUBAL LIGATION  ~2000  . WISDOM TOOTH EXTRACTION  age 89    Social History   Socioeconomic History  . Marital status: Married    Spouse name: Legrand Como  . Number of children: 2  . Years of education: 32  .  Highest education level: Not on file  Occupational History  . Occupation: Care Guide    Employer: [employer]    Comment: [employer]  Tobacco Use  . Smoking status: Never Smoker  . Smokeless tobacco: Never Used  Vaping Use  . Vaping Use: Never used  Substance and Sexual Activity  . Alcohol use: Yes    Alcohol/week: 2.0 standard drinks    Types: 2 Glasses of wine per week    Comment: q month  . Drug use: No  . Sexual activity: Yes    Birth control/protection: Surgical    Comment: tubal lig.-1st intercourse 50yo-More than 5 partners  Other Topics Concern  . Not on file  Social History Narrative   Recently married to Legrand Como   4 children between them   One son at home   Social Determinants of Health   Financial Resource Strain: Not on file  Food Insecurity: Not on file  Transportation Needs: Not on file  Physical Activity: Not on file  Stress: Not on file  Social Connections: Not on file  Intimate Partner Violence: Not on file    Current Outpatient Medications on File Prior to Visit  Medication Sig Dispense Refill  . Ascorbic Acid (VITAMIN C PO) Take by mouth.    Marland Kitchen CALCIUM PO Take by mouth.    . Cholecalciferol (VITAMIN D3 PO) Take by mouth.    . cyclobenzaprine (FLEXERIL) 10 MG tablet TAKE 1 TABLET BY MOUTH ONCE DAILY AS NEEDED. (Patient taking differently: No sig reported) 90 tablet 1  . escitalopram (LEXAPRO) 10 MG tablet Take 10 mg by mouth at bedtime.    . famotidine (PEPCID) 20 MG tablet Take 20 mg by mouth as needed for heartburn or indigestion.    . ferrous sulfate 325 (65 FE) MG EC tablet TAKE 1 TABLET (325 MG TOTAL) BY MOUTH 2 TIMES DAILY WITH A MEAL. (Patient taking differently: 3 (three) times daily with meals.) 180 tablet 0  . fluticasone (FLONASE) 50 MCG/ACT nasal spray Place into both nostrils daily.    . Ibuprofen (ADVIL PO) Take by mouth. Takes 2 of 200 mg prn    . levothyroxine (SYNTHROID) 150 MCG tablet TAKE 1 TABLET (150 MCG TOTAL) BY  MOUTH DAILY BEFORE BREAKFAST. 60 tablet 0  . MAGNESIUM PO Take by mouth. 500 mg dialy    . megestrol (MEGACE) 40 MG tablet Take 1 tablet (40 mg total) by mouth 3 (three) times daily until surgery. (Patient taking differently: Take 40 mg by mouth as needed.) 60 tablet 0  . Multiple Vitamin (MULTIVITAMIN) capsule Take by mouth daily.    Marland Kitchen UNABLE TO FIND L glutamine 500 mg daily    . valsartan-hydrochlorothiazide (DIOVAN-HCT) 320-25 MG tablet Take 1 tablet by mouth daily. 90 tablet 4   No current facility-administered medications on file prior to visit.    No Known Allergies  Family History  Problem Relation Age of Onset  . Other Son        hx/o guillian barre  . Hearing loss Father   . Heart disease Maternal Grandmother   . Heart disease Maternal Grandfather   . Stroke Paternal Grandmother   . Stroke Paternal Grandfather   . Cancer Neg Hx   . Diabetes Neg Hx   . Breast cancer Neg Hx   . Thyroid disease Neg Hx     BP (!) 150/62 (BP Location: Right Arm, Patient Position: Sitting, Cuff Size: Large)   Pulse 94   Ht 5' 6.3" (1.684 m)   Wt 232 lb (105.2 kg)   LMP 03/14/2021   SpO2 98%   BMI 37.11 kg/m     Review of Systems denies weight gain, constipation, numbness, and cold intolerance.  anxiety is well-controlled.      Objective:   Physical Exam VS: see vs page GEN: no distress HEAD: head: no deformity eyes: no periorbital swelling, no proptosis external nose and ears are normal NECK: supple, thyroid is not enlarged CHEST WALL: no deformity LUNGS: clear to auscultation CV: reg rate and rhythm, no murmur.  MUSCULOSKELETAL: gait is normal and steady EXTEMITIES: no deformity.  no leg edema NEURO:  readily moves all 4's.  sensation is intact to touch on all 4's.   SKIN:  Normal texture and temperature.  No rash or suspicious lesion is visible.   NODES:  None palpable at the neck PSYCH: alert, well-oriented.  Does not appear anxious nor depressed.     [place] (2013):  bilateral hyperechoic thyroid nodules likely adenomas and of low likelihood of malignancy  I have reviewed outside records, and summarized:  Pt was noted to have MNG, and referred here.  She was also seen for menorrhagia, and AH  was planned.    Lab Results  Component Value Date   TSH 0.75 04/11/2021   T3TOTAL 127 07/14/2018   T4TOTAL 10.8 12/27/2019       Assessment & Plan:  HTN: is noted today MNG, new to me, uncertain etiology and prognosis.   Hypothyroidism: well-controlled.  Please continue the same synthroid  Patient Instructions  Your blood pressure is high today.  Please see your primary care provider soon, to have it rechecked.  Blood tests are requested for you today.  We'll let you know about the results.  Let's recheck the ultrasound.  you will receive a phone call, about a day and time for an appointment.   Please come back for a follow-up appointment in 6 months

## 2021-04-11 NOTE — Patient Instructions (Addendum)
Your blood pressure is high today.  Please see your primary care provider soon, to have it rechecked.  Blood tests are requested for you today.  We'll let you know about the results.  Let's recheck the ultrasound.  you will receive a phone call, about a day and time for an appointment.   Please come back for a follow-up appointment in 6 months

## 2021-04-15 ENCOUNTER — Ambulatory Visit
Admission: RE | Admit: 2021-04-15 | Discharge: 2021-04-15 | Disposition: A | Discharge status: Home / Self Care | Payer: No Typology Code available for payment source | Source: Ambulatory Visit | Attending: Endocrinology | Admitting: Endocrinology

## 2021-04-15 DIAGNOSIS — E042 Nontoxic multinodular goiter: Secondary | ICD-10-CM

## 2021-04-18 ENCOUNTER — Ambulatory Visit: Payer: No Typology Code available for payment source | Admitting: Pulmonary Disease

## 2021-04-18 ENCOUNTER — Other Ambulatory Visit: Payer: Self-pay

## 2021-04-18 ENCOUNTER — Encounter: Payer: Self-pay | Admitting: Pulmonary Disease

## 2021-04-18 VITALS — BP 124/88 | HR 86 | Temp 97.8°F | Ht 67.0 in | Wt 234.0 lb

## 2021-04-18 DIAGNOSIS — I10 Essential (primary) hypertension: Secondary | ICD-10-CM

## 2021-04-18 DIAGNOSIS — G4733 Obstructive sleep apnea (adult) (pediatric): Secondary | ICD-10-CM | POA: Diagnosis not present

## 2021-04-18 NOTE — Assessment & Plan Note (Signed)
Controlled on 2 medications 

## 2021-04-18 NOTE — Assessment & Plan Note (Signed)
We reviewed home sleep test, this indicates severe OSA.  Red flags are excessive daytime somnolence, loud snoring and nonrefreshing sleep in spite of adequate sleep quality.   The pathophysiology of obstructive sleep apnea , it's cardiovascular consequences & modes of treatment including CPAP were discused with the patient in detail & they evidenced understanding.  We will proceed with CPAP therapy, auto CPAP will be prescribed 5 to 15 cm with nasal cradle mask.  We discussed alternative therapies and combinations with CPAP.  She is willing to proceed I expect her to feel refreshed and have little more energy after initiating CPAP

## 2021-04-18 NOTE — Progress Notes (Signed)
Subjective:    Patient ID: [name], female    DOB: 02/21/1971, 50 y.o.   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  She is exhausted during the day and reports daily naps.  Symptoms have been ongoing for at least 2 years.  Has been has not witnessed apneas.  In January she was noted to have anemia with hemoglobin of 7.9 and she had COVID infection 01/2021, initially thought that her symptoms were related to these events but symptoms are nonrefreshing sleep have persisted. Her dentist undertook a home sleep test which showed severe OSA with AHI of 72/hour lowest desaturation 86%  Epworth sleepiness score is 18 and she reports sleepiness while sitting and reading, watching TV, sitting inactive in a public place or as a passenger in the car. Bedtime is of been 9 and 10 PM, sleep latency is minimal, she sleeps on her side with 2 pillows, reports 1 nocturnal awakening and is out of bed within 6 and 6:30 AM feeling rested initially but gets tired as the day proceeds, with dryness of mouth and occasional headaches. She has gained 20 pounds over the last 3 years  There is no history suggestive of cataplexy, sleep paralysis or parasomnias She reports daily naps for about 30 minutes, when she goes to bed and wakes up with an alarm  Labs TSH is normal 03/2021   Significant tests/ events reviewed HST 03/25/2021 AHI 72/hour, lowest desaturation 86%, ODI 87/hour 60% snoring  Past Medical History:  Diagnosis Date  . Abnormal uterine bleeding (AUB) 10/11/2015  . ADD (attention deficit disorder)   . Anxiety   . Arthritis    oa  . COVID feb 16th or feb 17th   rapid home test cold symptoms x 4 days all symptoms resolved  . Elevated TSH since jan 2022   thryoid med adjusted see dr Loanne Drilling 05-02-2021  . Fatty liver    no problems in several yrs as of 04-02-21 per pt  . GERD  (gastroesophageal reflux disease)   . Heart murmur    "mild no cardiologist" saw heart md in 2015 and told was mild  . Heavy periods   . History of sleep study week of april 10th 2022 at dentist   waiting to see dr dohmier for sleep study   . Hypertension   . Hypothyroidism   . Iron deficiency anemia   . Joint pain   . Thyroid nodule    US done 2013 sees dr Ebony Hail may 20th 2022 for not sure which side no swallowing problems  . Vitamin D deficiency    Past Surgical History:  Procedure Laterality Date  . JOINT REPLACEMENT    . tooth implant  2016  . TOTAL KNEE ARTHROPLASTY Left 12/27/2018   Procedure: LEFT TOTAL KNEE ARTHROPLASTY;  Surgeon: Mcarthur Rossetti, MD;  Location: [facility];  Service: Orthopedics;  Laterality: Left;  . TUBAL LIGATION  ~2000  . WISDOM TOOTH EXTRACTION  age 64    No Known Allergies  Social History   Socioeconomic History  . Marital status: Married    Spouse name: Legrand Como  . Number of children: 2  . Years of education: 28  . Highest education level: Not on file  Occupational History  . Occupation: Care Guide    Employer: [employer]    Comment: [employer]  Tobacco Use  . Smoking  status: Never Smoker  . Smokeless tobacco: Never Used  Vaping Use  . Vaping Use: Never used  Substance and Sexual Activity  . Alcohol use: Yes    Alcohol/week: 2.0 standard drinks    Types: 2 Glasses of wine per week    Comment: q month  . Drug use: No  . Sexual activity: Yes    Birth control/protection: Surgical    Comment: tubal lig.-1st intercourse 50yo-More than 5 partners  Other Topics Concern  . Not on file  Social History Narrative   Recently married to Legrand Como   4 children between them   One son at home   Social Determinants of Health   Financial Resource Strain: Not on file  Food Insecurity: Not on file  Transportation Needs: Not on file  Physical Activity: Not on file  Stress: Not on file  Social Connections: Not on file   Intimate Partner Violence: Not on file    Family History  Problem Relation Age of Onset  . Other Son        hx/o guillian barre  . Hearing loss Father   . Heart disease Maternal Grandmother   . Heart disease Maternal Grandfather   . Stroke Paternal Grandmother   . Stroke Paternal Grandfather   . Cancer Neg Hx   . Diabetes Neg Hx   . Breast cancer Neg Hx   . Thyroid disease Neg Hx      Review of Systems Constitutional: negative for anorexia, fevers and sweats  Eyes: negative for irritation, redness and visual disturbance  Ears, nose, mouth, throat, and face: negative for earaches, epistaxis, nasal congestion and sore throat  Respiratory: negative for cough, dyspnea on exertion, sputum and wheezing  Cardiovascular: negative for chest pain, dyspnea, lower extremity edema, orthopnea, palpitations and syncope  Gastrointestinal: negative for abdominal pain, constipation, diarrhea, melena, nausea and vomiting  Genitourinary:negative for dysuria, frequency and hematuria  Hematologic/lymphatic: negative for bleeding, easy bruising and lymphadenopathy  Musculoskeletal:negative for arthralgias, muscle weakness and stiff joints  Neurological: negative for coordination problems, gait problems, headaches and weakness  Endocrine: negative for diabetic symptoms including polydipsia, polyuria and weight loss     Objective:   Physical Exam  Gen. Pleasant, obese, in no distress, normal affect ENT - no pallor,icterus, no post nasal drip, class 2 airway Neck: No JVD, no thyromegaly, no carotid bruits Lungs: no use of accessory muscles, no dullness to percussion, decreased without rales or rhonchi  Cardiovascular: Rhythm regular, heart sounds  normal, no murmurs or gallops, no peripheral edema Abdomen: soft and non-tender, no hepatosplenomegaly, BS normal. Musculoskeletal: No deformities, no cyanosis or clubbing Neuro:  alert, non focal, no tremors       Assessment & Plan:

## 2021-04-18 NOTE — Patient Instructions (Signed)
Rx for autoCPAP 5-15 cm , nasal cradle mask will be sent to DME

## 2021-04-23 ENCOUNTER — Other Ambulatory Visit (HOSPITAL_COMMUNITY): Payer: Self-pay

## 2021-04-23 MED ORDER — WEGOVY 0.5 MG/0.5ML ~~LOC~~ SOAJ
SUBCUTANEOUS | 0 refills | Status: DC
  Filled 2021-04-23: qty 4, 56d supply, fill #0

## 2021-04-23 MED ORDER — WEGOVY 0.25 MG/0.5ML ~~LOC~~ SOAJ
SUBCUTANEOUS | 0 refills | Status: DC
  Filled 2021-04-23: qty 2, 28d supply, fill #0

## 2021-05-02 ENCOUNTER — Ambulatory Visit: Payer: No Typology Code available for payment source | Admitting: Endocrinology

## 2021-05-09 ENCOUNTER — Ambulatory Visit: Payer: No Typology Code available for payment source | Admitting: Cardiology

## 2021-05-10 ENCOUNTER — Emergency Department (HOSPITAL_BASED_OUTPATIENT_CLINIC_OR_DEPARTMENT_OTHER)
Admission: EM | Admit: 2021-05-10 | Discharge: 2021-05-10 | Disposition: A | Discharge status: Home / Self Care | Payer: No Typology Code available for payment source | Attending: Emergency Medicine | Admitting: Emergency Medicine

## 2021-05-10 ENCOUNTER — Encounter (HOSPITAL_BASED_OUTPATIENT_CLINIC_OR_DEPARTMENT_OTHER): Payer: Self-pay | Admitting: *Deleted

## 2021-05-10 ENCOUNTER — Other Ambulatory Visit: Payer: Self-pay

## 2021-05-10 ENCOUNTER — Emergency Department (HOSPITAL_BASED_OUTPATIENT_CLINIC_OR_DEPARTMENT_OTHER): Payer: No Typology Code available for payment source

## 2021-05-10 DIAGNOSIS — D734 Cyst of spleen: Secondary | ICD-10-CM | POA: Insufficient documentation

## 2021-05-10 DIAGNOSIS — Z8616 Personal history of COVID-19: Secondary | ICD-10-CM | POA: Insufficient documentation

## 2021-05-10 DIAGNOSIS — I10 Essential (primary) hypertension: Secondary | ICD-10-CM | POA: Diagnosis not present

## 2021-05-10 DIAGNOSIS — Z96652 Presence of left artificial knee joint: Secondary | ICD-10-CM | POA: Insufficient documentation

## 2021-05-10 DIAGNOSIS — E039 Hypothyroidism, unspecified: Secondary | ICD-10-CM | POA: Insufficient documentation

## 2021-05-10 DIAGNOSIS — R079 Chest pain, unspecified: Secondary | ICD-10-CM

## 2021-05-10 DIAGNOSIS — Z79899 Other long term (current) drug therapy: Secondary | ICD-10-CM | POA: Insufficient documentation

## 2021-05-10 DIAGNOSIS — R0789 Other chest pain: Secondary | ICD-10-CM | POA: Diagnosis present

## 2021-05-10 LAB — HEPATIC FUNCTION PANEL
ALT: 20 U/L (ref 0–44)
AST: 18 U/L (ref 15–41)
Albumin: 4.1 g/dL (ref 3.5–5.0)
Alkaline Phosphatase: 98 U/L (ref 38–126)
Bilirubin, Direct: <0.1 mg/dL (ref 0.0–0.2)
Total Bilirubin: 0.3 mg/dL (ref 0.3–1.2)
Total Protein: 6.5 g/dL (ref 6.5–8.1)

## 2021-05-10 LAB — TROPONIN I (HIGH SENSITIVITY)
Troponin I (High Sensitivity): 4 ng/L (ref <18)
Troponin I (High Sensitivity): 4 ng/L (ref <18)

## 2021-05-10 LAB — CBC
HCT: 40.1 % (ref 36.0–46.0)
Hemoglobin: 13.5 g/dL (ref 12.0–15.0)
MCH: 27.6 pg (ref 26.0–34.0)
MCHC: 33.7 g/dL (ref 30.0–36.0)
MCV: 82 fL (ref 80.0–100.0)
Platelets: 301 10*3/uL (ref 150–400)
RBC: 4.89 MIL/uL (ref 3.87–5.11)
RDW: 14.3 % (ref 11.5–15.5)
WBC: 7.7 10*3/uL (ref 4.0–10.5)
nRBC: 0 % (ref 0.0–0.2)

## 2021-05-10 LAB — BASIC METABOLIC PANEL
Anion gap: 10 (ref 5–15)
BUN: 16 mg/dL (ref 6–20)
CO2: 23 mmol/L (ref 22–32)
Calcium: 9.2 mg/dL (ref 8.9–10.3)
Chloride: 105 mmol/L (ref 98–111)
Creatinine, Ser: 1 mg/dL (ref 0.44–1.00)
GFR, Estimated: >60 mL/min (ref >60)
Glucose, Bld: 88 mg/dL (ref 70–99)
Potassium: 3.5 mmol/L (ref 3.5–5.1)
Sodium: 138 mmol/L (ref 135–145)

## 2021-05-10 LAB — LIPASE, BLOOD: Lipase: 29 U/L (ref 11–51)

## 2021-05-10 LAB — PREGNANCY, URINE: Preg Test, Ur: NEGATIVE

## 2021-05-10 MED ORDER — IOHEXOL 350 MG/ML SOLN
100.0000 mL | Freq: Once | INTRAVENOUS | Status: AC | PRN
  Administered 2021-05-10: 100 mL via INTRAVENOUS

## 2021-05-10 NOTE — ED Triage Notes (Signed)
Pt had an episode of sharpe cp with crushing feeling in center of chest through to back on Thursday (15 min) again today (15 min).Some sweating today.

## 2021-05-10 NOTE — ED Notes (Signed)
Warm blanket and socks provided

## 2021-05-10 NOTE — ED Provider Notes (Signed)
[facility] EMERGENCY DEPT Provider Note   CSN: [id number] Arrival date & time: 05/10/21  0935     History Chief Complaint  Patient presents with  . Chest Pain    [name] is a 50 y.o. female.  HPI      Presents with chest pain, had 2 episodes One episode on Thursday, and one episode today around 8AM Each lasted about 15 minutes This AM was cooking breakfast when it started, thurs was sitting at desk Nothing seemed to make it better or worse, just constant Had some diaphoresis this AM, but not on Thursday. No shortness of breath, nausea or vomiting Sharp pain, not worse with deep breaths or position changes but pain so severe not sure Sharp pain in chest with radiation to back Supposed to have hysterectomy but put off for cardiac evaluation by dr. Percival Spanish given abnormal ECG Has had chest pain in the past but not like this before, never had pain in back before   Hx of htn, prior hyperlipidemia but looked ok on last labs and not on medicine. No DM No smoking other drugs,  occ etoh Grandfather with heart disease, no immediate fam hx of early heart disease  Past Medical History:  Diagnosis Date  . Abnormal uterine bleeding (AUB) 10/11/2015  . ADD (attention deficit disorder)   . Anxiety   . Arthritis    oa  . COVID feb 16th or feb 17th   rapid home test cold symptoms x 4 days all symptoms resolved  . Elevated TSH since jan 2022   thryoid med adjusted see dr Loanne Drilling 05-02-2021  . Fatty liver    no problems in several yrs as of 04-02-21 per pt  . GERD (gastroesophageal reflux disease)   . Heart murmur    "mild no cardiologist" saw heart md in 2015 and told was mild  . Heavy periods   . History of sleep study week of april 10th 2022 at dentist   waiting to see dr dohmier for sleep study   . Hypertension   . Hypothyroidism   . Iron deficiency anemia   . Joint pain   . Thyroid nodule    US done 2013 sees dr Ebony Hail may 20th 2022 for not sure  which side no swallowing problems  . Vitamin D deficiency     Patient Active Problem List   Diagnosis Date Noted  . OSA (obstructive sleep apnea) 04/18/2021  . Multinodular goiter 04/11/2021  . 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/27/2018  . Unilateral primary osteoarthritis, left knee 11/01/2018  . Primary osteoarthritis of left knee 11/15/2017  . Dense breast tissue on mammogram 10/21/2016  . HLD (hyperlipidemia) 10/21/2016  . LVH (left ventricular hypertrophy) 10/21/2016  . Mitral regurgitation 10/21/2016  . Abnormal uterine bleeding (AUB) 10/11/2015  . Hypertension 10/01/2015  . Menorrhagia with regular cycle 06/26/2014  . ADD (attention deficit disorder) 02/18/2012  . Hypothyroidism 12/30/2011    Past Surgical History:  Procedure Laterality Date  . JOINT REPLACEMENT    . tooth implant  2016  . TOTAL KNEE ARTHROPLASTY Left 12/27/2018   Procedure: LEFT TOTAL KNEE ARTHROPLASTY;  Surgeon: Mcarthur Rossetti, MD;  Location: [facility];  Service: Orthopedics;  Laterality: Left;  . TUBAL LIGATION  ~2000  . WISDOM TOOTH EXTRACTION  age 22     OB History    Gravida  3   Para  2   Term  2   Preterm      AB  1  Living  2     SAB      IAB  1   Ectopic      Multiple      Live Births              Family History  Problem Relation Age of Onset  . Other Son        hx/o guillian barre  . Hearing loss Father   . Heart disease Maternal Grandmother   . Heart disease Maternal Grandfather   . Stroke Paternal Grandmother   . Stroke Paternal Grandfather   . Cancer Neg Hx   . Diabetes Neg Hx   . Breast cancer Neg Hx   . Thyroid disease Neg Hx     Social History   Tobacco Use  . Smoking status: Never Smoker  . Smokeless tobacco: Never Used  Vaping Use  . Vaping Use: Never used  Substance Use Topics  . Alcohol use: Yes    Alcohol/week: 2.0 standard drinks    Types: 2 Glasses of wine per week    Comment: q month  . Drug use: No    Home  Medications Prior to Admission medications   Medication Sig Start Date End Date Taking? Authorizing Provider  CALCIUM PO Take by mouth.   Yes [provider]  Cholecalciferol (VITAMIN D3 PO) Take by mouth.   Yes [provider]  cyclobenzaprine (FLEXERIL) 10 MG tablet TAKE 1 TABLET BY MOUTH ONCE DAILY AS NEEDED. Patient taking differently: Take 5 mg by mouth at bedtime. Takes 5 mg at hs 02/28/21 02/28/22 Yes Asencion Noble, MD  escitalopram (LEXAPRO) 10 MG tablet Take 10 mg by mouth at bedtime. 01/07/21  Yes [provider]  famotidine (PEPCID) 20 MG tablet Take 20 mg by mouth as needed for heartburn or indigestion.   Yes [provider]  ferrous sulfate 325 (65 FE) MG EC tablet TAKE 1 TABLET (325 MG TOTAL) BY MOUTH 2 TIMES DAILY WITH A MEAL. Patient taking differently: 3 (three) times daily with meals. 01/16/21 01/16/22 Yes Wallace, Tiffany A, NP  fluticasone (FLONASE) 50 MCG/ACT nasal spray Place into both nostrils daily.   Yes [provider]  Ibuprofen (ADVIL PO) Take by mouth. Takes 2 of 200 mg prn   Yes [provider]  levothyroxine (SYNTHROID) 150 MCG tablet TAKE 1 TABLET (150 MCG TOTAL) BY MOUTH DAILY BEFORE BREAKFAST. 03/14/21 03/14/22 Yes Juleen China, Tiffany A, NP  MAGNESIUM PO Take by mouth. 500 mg dialy   Yes [provider]  megestrol (MEGACE) 40 MG tablet Take 1 tablet (40 mg total) by mouth 3 (three) times daily until surgery. Patient taking differently: Take 40 mg by mouth as needed. 03/18/21  Yes Princess Bruins, MD  Multiple Vitamin (MULTIVITAMIN) capsule Take by mouth daily.   Yes [provider]  Semaglutide-Weight Management (WEGOVY) 0.25 MG/0.5ML SOAJ Inject once into the skin every week 04/23/21  Yes   valsartan-hydrochlorothiazide (DIOVAN-HCT) 320-25 MG tablet Take 1 tablet by mouth daily. 03/20/21  Yes   Ascorbic Acid (VITAMIN C PO) Take by mouth.    [provider]  Semaglutide-Weight Management (WEGOVY) 0.5  MG/0.5ML SOAJ Inject once into the skin every week 04/23/21     UNABLE TO FIND L glutamine 500 mg daily    [provider]    Allergies    Patient has no known allergies.  Review of Systems   Review of Systems  Constitutional: Positive for diaphoresis. Negative for fever.  Respiratory: Negative for cough  and shortness of breath.   Cardiovascular: Positive for chest pain.  Gastrointestinal: Negative for abdominal pain, diarrhea, nausea and vomiting.  Genitourinary: Negative for dysuria.  Musculoskeletal: Positive for back pain.  Skin: Negative for rash.  Neurological: Negative for light-headedness and headaches.    Physical Exam Updated Vital Signs BP (!) 123/95   Pulse 84   Temp 98.1 F (36.7 C) (Oral)   Resp 19   Ht 5' 6.75" (1.695 m)   Wt 106.6 kg   SpO2 99%   BMI 37.08 kg/m   Physical Exam Vitals and nursing note reviewed.  Constitutional:      General: She is not in acute distress.    Appearance: She is well-developed. She is not diaphoretic.  HENT:     Head: Normocephalic and atraumatic.  Eyes:     Conjunctiva/sclera: Conjunctivae normal.  Cardiovascular:     Rate and Rhythm: Normal rate and regular rhythm.     Heart sounds: Normal heart sounds. No murmur heard. No friction rub. No gallop.   Pulmonary:     Effort: Pulmonary effort is normal. No respiratory distress.     Breath sounds: Normal breath sounds. No wheezing or rales.  Abdominal:     General: There is no distension.     Palpations: Abdomen is soft.     Tenderness: There is no abdominal tenderness. There is no guarding.  Musculoskeletal:        General: No tenderness.     Cervical back: Normal range of motion.  Skin:    General: Skin is warm and dry.     Findings: No erythema or rash.  Neurological:     Mental Status: She is alert and oriented to person, place, and time.     ED Results / Procedures / Treatments   Labs (all labs ordered are listed, but only abnormal results are  displayed) Labs Reviewed  BASIC METABOLIC PANEL  CBC  PREGNANCY, URINE  HEPATIC FUNCTION PANEL  LIPASE, BLOOD  TROPONIN I (HIGH SENSITIVITY)  TROPONIN I (HIGH SENSITIVITY)    EKG EKG Interpretation  Date/Time:  Saturday May 10 2021 09:44:03 EDT Ventricular Rate:  100 PR Interval:  156 QRS Duration: 91 QT Interval:  340 QTC Calculation: 439 R Axis:   58 Text Interpretation: Sinus tachycardia Multiple ventricular premature complexes Repol abnrm suggests ischemia, anterolateral Since prior ECG, PVC new, no other significant changes Confirmed by Gareth Morgan [phone]) on 05/10/2021 9:50:25 AM   Radiology DG Chest Portable 1 View  Result Date: 05/10/2021 CLINICAL DATA:  50 year old female with history of chest pain for the past 4 days. EXAM: PORTABLE CHEST 1 VIEW COMPARISON:  No priors. FINDINGS: Lung volumes are normal. No consolidative airspace disease. No pleural effusions. No pneumothorax. No pulmonary nodule or mass noted. Pulmonary vasculature and the cardiomediastinal silhouette are within normal limits. IMPRESSION: No radiographic evidence of acute cardiopulmonary disease. Electronically Signed   By: Vinnie Langton M.D.   On: 05/10/2021 11:12   CT Angio Chest/Abd/Pel for Dissection W and/or Wo Contrast  Result Date: 05/10/2021 CLINICAL DATA:  Chest and abdominal pain, aortic dissection suspected EXAM: CT ANGIOGRAPHY CHEST, ABDOMEN AND PELVIS TECHNIQUE: Non-contrast CT of the chest was initially obtained. Multidetector CT imaging through the chest, abdomen and pelvis was performed using the standard protocol during bolus administration of intravenous contrast. Multiplanar reconstructed images and MIPs were obtained and reviewed to evaluate the vascular anatomy. CONTRAST:  117mL OMNIPAQUE IOHEXOL 350 MG/ML SOLN COMPARISON:  None. FINDINGS: CTA CHEST FINDINGS Cardiovascular: Preferential  opacification of the thoracic aorta. Normal contour and caliber of the thoracic aorta. No  evidence of aneurysm, dissection, or other acute aortic pathology. No significant atherosclerosis. Normal heart size. No pericardial effusion. Mediastinum/Nodes: No enlarged mediastinal, hilar, or axillary lymph nodes. Thyroid gland, trachea, and esophagus demonstrate no significant findings. Lungs/Pleura: Lungs are clear. No pleural effusion or pneumothorax. Musculoskeletal: No chest wall abnormality. No acute or significant osseous findings. Review of the MIP images confirms the above findings. CTA ABDOMEN AND PELVIS FINDINGS VASCULAR Normal contour and caliber of the abdominal aorta. No evidence of aneurysm, dissection, or other acute aortic pathology. There is a small, accessory inferior pole right renal artery with a solitary left renal artery. Otherwise standard branching pattern of the abdominal aorta. No significant atherosclerosis. Review of the MIP images confirms the above findings. NON-VASCULAR Hepatobiliary: Hepatomegaly, maximum coronal span 23.0 cm. Hepatic steatosis. No solid liver abnormality is seen. No gallstones, gallbladder wall thickening, or biliary dilatation. Pancreas: Unremarkable. No pancreatic ductal dilatation or surrounding inflammatory changes. Spleen: Normal in size. There is a rim calcified, fluid attenuation cyst of the spleen measuring 5.0 cm. Adrenals/Urinary Tract: Adrenal glands are unremarkable. Kidneys are normal, without renal calculi, solid lesion, or hydronephrosis. Bladder is unremarkable. Stomach/Bowel: Stomach is within normal limits. Appendix appears normal. No evidence of bowel wall thickening, distention, or inflammatory changes. Lymphatic: No enlarged abdominal or pelvic lymph nodes. Reproductive: Uterine fibroids. Other: No abdominal wall hernia or abnormality. No abdominopelvic ascites. Musculoskeletal: No acute or significant osseous findings. Review of the MIP images confirms the above findings. IMPRESSION: 1. Normal contour and caliber of the thoracic and  abdominal aorta. No evidence of aneurysm, dissection, or other acute aortic pathology. No significant atherosclerosis. 2. Hepatomegaly and hepatic steatosis. 3. There is a rim calcified, fluid attenuation cyst of the spleen measuring 5.0 cm. 4. Uterine fibroids. Electronically Signed   By: Eddie Candle M.D.   On: 05/10/2021 11:43    Procedures Procedures   Medications Ordered in ED Medications  iohexol (OMNIPAQUE) 350 MG/ML injection 100 mL (100 mLs Intravenous Contrast Given 05/10/21 1107)    ED Course  I have reviewed the triage vital signs and the nursing notes.  Pertinent labs & imaging results that were available during my care of the patient were reviewed by me and considered in my medical decision making (see chart for details).    MDM Rules/Calculators/A&P                          50yo female with history of hypertension, prior hyperlipidemia, OSA, abnormal uterine bleeding with scheduled hysterectomy, presents with concern for 2 episodes of chest pain.  Differential diagnosis for chest pain includes pulmonary embolus, dissection, pneumothorax, pneumonia, ACS, myocarditis, pericarditis.  EKG was done and evaluate by me and showed no acute ST changes and no signs of pericarditis, similar to prior with new PVCs.. Chest x-ray was done and evaluated by me and radiology and showed no sign of pneumonia or pneumothorax.  No dyspnea and have low suspicion for PE.   Given chest pain radiating to the back, dissection study ordered which shows no evidence of acute abnormalities, shows splenic cyst.  HEART score 4, chest pain free, and had delta troponins which were both negative.  Recommend close follow up with Cardiology.  Discharged in stable condition with understanding of reasons to return.     Final Clinical Impression(s) / ED Diagnoses Final diagnoses:  Chest pain, unspecified type  Splenic cyst  Rx / DC Orders ED Discharge Orders    None       Gareth Morgan, MD 05/10/21  2140

## 2021-05-10 NOTE — ED Notes (Signed)
Patty -RN aware of pt's BP.

## 2021-05-14 ENCOUNTER — Telehealth: Payer: Self-pay | Admitting: Pulmonary Disease

## 2021-05-14 DIAGNOSIS — G4733 Obstructive sleep apnea (adult) (pediatric): Secondary | ICD-10-CM

## 2021-05-14 NOTE — Telephone Encounter (Signed)
Called and spoke with patient to let her know we got her message about wanting order to be sent to [facility] saying that we are ok with her getting luna or ibreeze. New order has been placed. Nothing further needed at this time,

## 2021-05-16 ENCOUNTER — Other Ambulatory Visit (HOSPITAL_COMMUNITY): Payer: Self-pay

## 2021-05-16 MED FILL — Escitalopram Oxalate Tab 10 MG (Base Equiv): ORAL | 90 days supply | Qty: 90 | Fill #0 | Status: CN

## 2021-05-16 MED FILL — Cyclobenzaprine HCl Tab 10 MG: ORAL | 90 days supply | Qty: 90 | Fill #0 | Status: CN

## 2021-05-23 ENCOUNTER — Other Ambulatory Visit (HOSPITAL_COMMUNITY): Payer: Self-pay | Admitting: Internal Medicine

## 2021-05-23 DIAGNOSIS — R1011 Right upper quadrant pain: Secondary | ICD-10-CM

## 2021-05-26 ENCOUNTER — Other Ambulatory Visit (HOSPITAL_COMMUNITY): Payer: Self-pay

## 2021-05-27 DIAGNOSIS — Z01810 Encounter for preprocedural cardiovascular examination: Secondary | ICD-10-CM | POA: Insufficient documentation

## 2021-05-27 DIAGNOSIS — I4729 Other ventricular tachycardia: Secondary | ICD-10-CM | POA: Insufficient documentation

## 2021-05-27 NOTE — Progress Notes (Signed)
Cardiology Office Note   Date:  05/28/2021   ID:  [name], DOB [birth year], MRN [id number]  PCP:  Asencion Noble, MD  Cardiologist:   Minus Breeding, MD Referring:  Asencion Noble, MD  Chief Complaint  Patient presents with   Chest Pain       History of Present Illness: [name] is a 50 y.o. female who is referred by Asencion Noble, MD . She needs medical clearance for vaginal hysterectomy.         She had a Holter in 2018 with NSVT .    She saw Dr. Harl Bowie at that time and she was having palpitations.   Echo in 2016 demonstrated a normal EF with mild MR. She thinks this was done to evaluate a history of mitral valve prolapse.  She was in the ED in late May for chest pain.  I reviewed these records for this visit.    She had non anginal chest pain.  CT demonstrated no PE or dissection.  Ultimately her primary provider thinks this might be her gallbladder.  She also thought it might have been related to the [facility].  She was taking this for weight loss and she stopped taking this.  She has not had any of the pain in a week.  It is a discomfort that is in her mid chest and goes straight through to her shoulder blades.  It is episodic and occurs at rest.  Denies any severe.  It was sharp.  She did not describe associated nausea vomiting or diaphoresis.  She did not describe associated palpitations, presyncope or syncope.  She is able to walk her dog 4 to 5 days/week without getting this.  She does have profound fatigue but she is also just been diagnosed with sleep apnea and has not yet started CPAP.  Of note she does have an abnormal EKG which was also a concern preoperatively.  Unable to review this in late May when she was found to have premature ventricular contractions.  She also had inferolateral T wave inversions but these were unchanged from 2016.    Past Medical History:  Diagnosis Date   Abnormal uterine bleeding (AUB) 10/11/2015   ADD (attention deficit disorder)     Anxiety    Arthritis    oa   COVID feb 16th or feb 17th   rapid home test cold symptoms x 4 days all symptoms resolved   Elevated TSH since jan 2022   thryoid med adjusted see dr Loanne Drilling 05-02-2021   Fatty liver    no problems in several yrs as of 04-02-21 per pt   GERD (gastroesophageal reflux disease)    Heavy periods    Hypertension    Hypothyroidism    Iron deficiency anemia    Sleep apnea    Thyroid nodule    US done 2013 sees dr Ebony Hail may 20th 2022 for not sure which side no swallowing problems   Vitamin D deficiency     Past Surgical History:  Procedure Laterality Date   JOINT REPLACEMENT     tooth implant  2016   TOTAL KNEE ARTHROPLASTY Left 12/27/2018   Procedure: LEFT TOTAL KNEE ARTHROPLASTY;  Surgeon: Mcarthur Rossetti, MD;  Location: [facility];  Service: Orthopedics;  Laterality: Left;   TUBAL LIGATION  ~2000   WISDOM TOOTH EXTRACTION  age 70     Current Outpatient Medications  Medication Sig Dispense Refill   Ascorbic Acid (VITAMIN C PO) Take by mouth.  CALCIUM PO Take by mouth.     Cholecalciferol (VITAMIN D3 PO) Take by mouth.     cyclobenzaprine (FLEXERIL) 10 MG tablet TAKE 1 TABLET BY MOUTH ONCE DAILY AS NEEDED. 90 tablet 1   escitalopram (LEXAPRO) 10 MG tablet Take 10 mg by mouth at bedtime.     famotidine (PEPCID) 20 MG tablet Take 20 mg by mouth as needed for heartburn or indigestion.     ferrous sulfate 325 (65 FE) MG EC tablet TAKE 1 TABLET (325 MG TOTAL) BY MOUTH 2 TIMES DAILY WITH A MEAL. 180 tablet 0   fluticasone (FLONASE) 50 MCG/ACT nasal spray Place into both nostrils daily.     Ibuprofen (ADVIL PO) Take by mouth. Takes 2 of 200 mg prn     levothyroxine (SYNTHROID) 150 MCG tablet TAKE 1 TABLET (150 MCG TOTAL) BY MOUTH DAILY BEFORE BREAKFAST. 60 tablet 0   MAGNESIUM PO Take by mouth. 500 mg dialy     megestrol (MEGACE) 40 MG tablet Take 1 tablet (40 mg total) by mouth 3 (three) times daily until surgery. 60 tablet 0   Multiple Vitamin  (MULTIVITAMIN) capsule Take by mouth daily.     UNABLE TO FIND L glutamine 500 mg daily     valsartan-hydrochlorothiazide (DIOVAN-HCT) 320-25 MG tablet Take 1 tablet by mouth daily. 90 tablet 4   No current facility-administered medications for this visit.    Allergies:   Patient has no known allergies.    Social History:  The patient  reports that she has never smoked. She has never used smokeless tobacco. She reports current alcohol use of about 2.0 standard drinks of alcohol per week. She reports that she does not use drugs.   Family History:  The patient's family history includes Hearing loss in her father; Heart disease in her maternal grandfather and maternal grandmother; Other in her son; Stroke in her paternal grandfather and paternal grandmother.    ROS:  Please see the history of present illness.   Otherwise, review of systems are positive for none.   All other systems are reviewed and negative.    PHYSICAL EXAM: VS:  BP (!) 149/97   Pulse 90   Ht 5' 6.75" (1.695 m)   Wt 240 lb 9.6 oz (109.1 kg)   SpO2 95%   BMI 37.97 kg/m  , BMI Body mass index is 37.97 kg/m. GENERAL:  Well appearing HEENT:  Pupils equal round and reactive, fundi not visualized, oral mucosa unremarkable NECK:  No jugular venous distention, waveform within normal limits, carotid upstroke brisk and symmetric, no bruits, no thyromegaly LYMPHATICS:  No cervical, inguinal adenopathy LUNGS:  Clear to auscultation bilaterally BACK:  No CVA tenderness CHEST:  Unremarkable HEART:  PMI not displaced or sustained,S1 and S2 within normal limits, no S3, no S4, no clicks, no rubs, no murmurs ABD:  Flat, positive bowel sounds normal in frequency in pitch, no bruits, no rebound, no guarding, no midline pulsatile mass, no hepatomegaly, no splenomegaly EXT:  2 plus pulses throughout, no edema, no cyanosis no clubbing SKIN:  No rashes no nodules NEURO:  Cranial nerves II through XII grossly intact, motor grossly intact  throughout PSYCH:  Cognitively intact, oriented to person place and time    EKG:  EKG is not ordered today. The ekg ordered 05/10/2021 demonstrates sinus rhythm, rate 100, axis within normal limits, intervals within normal limits, premature ventricular contractions, inferolateral T wave versions.   Recent Labs: 04/11/2021: TSH 0.75 05/10/2021: ALT 20; BUN 16; Creatinine, Ser  1.00; Hemoglobin 13.5; Platelets 301; Potassium 3.5; Sodium 138    Lipid Panel    Component Value Date/Time   CHOL 149 01/15/2021 0956   CHOL 230 (H) 07/14/2018 1141   TRIG 120 01/15/2021 0956   HDL 37 (L) 01/15/2021 0956   HDL 40 07/14/2018 1141   CHOLHDL 4.0 01/15/2021 0956   VLDL 33 (H) 05/27/2017 0913   LDLCALC 90 01/15/2021 0956      Wt Readings from Last 3 Encounters:  05/28/21 240 lb 9.6 oz (109.1 kg)  05/10/21 235 lb (106.6 kg)  04/18/21 234 lb (106.1 kg)      Other studies Reviewed: Additional studies/ records that were reviewed today include: ED records and previous cardiac studies. Review of the above records demonstrates:  Please see elsewhere in the note.     ASSESSMENT AND PLAN:  NSVT: She is not bothered by the palpitations.  No change in therapy is indicated.  She will be having evaluation as above.  MR: I do not appreciate mitral regurgitation on exam.  This was mild.  There was no prolapse.  I do not think further evaluation is indicated.  CHEST PAIN: He has chest pain.  She has an abnormal EKG.  She has a history of nonsustained ventricular tachycardia.  She has some cardiovascular risk factors.  I think stress testing is indicated but with her baseline abnormal EKG I would not suggest POET (Plain Old Exercise Treadmill) would be helpful.  She will need a [employer]Marland Kitchen  PREOP: Preop clearance will be based on these results.  Current medicines are reviewed at length with the patient today.  The patient does not have concerns regarding medicines.  The following changes have  been made:  no change  Labs/ tests ordered today include: None  Orders Placed This Encounter  Procedures   NM Myocar Multi W/Spect W/Wall Motion / EF      Disposition:   FU with me as needed and based on the results of the above.   Signed, Minus Breeding, MD  05/28/2021 2:44 PM    [facility]

## 2021-05-28 ENCOUNTER — Ambulatory Visit (INDEPENDENT_AMBULATORY_CARE_PROVIDER_SITE_OTHER): Payer: No Typology Code available for payment source | Admitting: Cardiology

## 2021-05-28 ENCOUNTER — Encounter: Payer: Self-pay | Admitting: *Deleted

## 2021-05-28 ENCOUNTER — Encounter: Payer: Self-pay | Admitting: Cardiology

## 2021-05-28 VITALS — BP 149/97 | HR 90 | Ht 66.75 in | Wt 240.6 lb

## 2021-05-28 DIAGNOSIS — I340 Nonrheumatic mitral (valve) insufficiency: Secondary | ICD-10-CM

## 2021-05-28 DIAGNOSIS — R079 Chest pain, unspecified: Secondary | ICD-10-CM

## 2021-05-28 DIAGNOSIS — R9431 Abnormal electrocardiogram [ECG] [EKG]: Secondary | ICD-10-CM

## 2021-05-28 DIAGNOSIS — I472 Ventricular tachycardia: Secondary | ICD-10-CM | POA: Diagnosis not present

## 2021-05-28 DIAGNOSIS — Z01810 Encounter for preprocedural cardiovascular examination: Secondary | ICD-10-CM | POA: Diagnosis not present

## 2021-05-28 DIAGNOSIS — I4729 Other ventricular tachycardia: Secondary | ICD-10-CM

## 2021-05-28 NOTE — Patient Instructions (Addendum)

## 2021-05-30 ENCOUNTER — Ambulatory Visit (HOSPITAL_COMMUNITY)
Admission: RE | Admit: 2021-05-30 | Discharge: 2021-05-30 | Disposition: A | Discharge status: Home / Self Care | Payer: No Typology Code available for payment source | Source: Ambulatory Visit | Attending: Internal Medicine | Admitting: Internal Medicine

## 2021-05-30 ENCOUNTER — Other Ambulatory Visit: Payer: Self-pay

## 2021-05-30 DIAGNOSIS — R1011 Right upper quadrant pain: Secondary | ICD-10-CM | POA: Diagnosis present

## 2021-06-02 ENCOUNTER — Other Ambulatory Visit (HOSPITAL_COMMUNITY): Payer: Self-pay

## 2021-06-02 MED ORDER — ESCITALOPRAM OXALATE 10 MG PO TABS
10.0000 mg | ORAL_TABLET | Freq: Every morning | ORAL | 4 refills | Status: DC
  Filled 2021-06-02 – 2021-11-11 (×2): qty 90, 90d supply, fill #0
  Filled 2022-03-04: qty 90, 90d supply, fill #1

## 2021-06-02 MED FILL — Cyclobenzaprine HCl Tab 10 MG: ORAL | 90 days supply | Qty: 90 | Fill #0 | Status: AC

## 2021-06-03 ENCOUNTER — Ambulatory Visit (HOSPITAL_COMMUNITY): Payer: No Typology Code available for payment source

## 2021-06-03 ENCOUNTER — Encounter (HOSPITAL_COMMUNITY): Payer: No Typology Code available for payment source

## 2021-06-04 ENCOUNTER — Encounter (HOSPITAL_BASED_OUTPATIENT_CLINIC_OR_DEPARTMENT_OTHER)
Admission: RE | Admit: 2021-06-04 | Discharge: 2021-06-04 | Disposition: A | Discharge status: Home / Self Care | Payer: No Typology Code available for payment source | Source: Ambulatory Visit | Attending: Cardiology | Admitting: Cardiology

## 2021-06-04 ENCOUNTER — Encounter (HOSPITAL_COMMUNITY)
Admission: RE | Admit: 2021-06-04 | Discharge: 2021-06-04 | Disposition: A | Discharge status: Home / Self Care | Payer: No Typology Code available for payment source | Source: Ambulatory Visit | Attending: Cardiology | Admitting: Cardiology

## 2021-06-04 ENCOUNTER — Encounter (HOSPITAL_COMMUNITY): Payer: Self-pay

## 2021-06-04 DIAGNOSIS — R9431 Abnormal electrocardiogram [ECG] [EKG]: Secondary | ICD-10-CM | POA: Diagnosis not present

## 2021-06-04 DIAGNOSIS — R079 Chest pain, unspecified: Secondary | ICD-10-CM

## 2021-06-04 LAB — NM MYOCAR MULTI W/SPECT W/WALL MOTION / EF
LV dias vol: 91 mL (ref 46–106)
LV sys vol: 32 mL
Peak HR: 113 {beats}/min
RATE: 0.3
Rest HR: 86 {beats}/min
SDS: 2
SRS: 1
SSS: 3
TID: 1.07

## 2021-06-04 MED ORDER — TECHNETIUM TC 99M TETROFOSMIN IV KIT
30.0000 | PACK | Freq: Once | INTRAVENOUS | Status: AC | PRN
  Administered 2021-06-04: 33 via INTRAVENOUS

## 2021-06-04 MED ORDER — TECHNETIUM TC 99M TETROFOSMIN IV KIT
10.0000 | PACK | Freq: Once | INTRAVENOUS | Status: AC | PRN
  Administered 2021-06-04: 9.7 via INTRAVENOUS

## 2021-06-04 MED ORDER — SODIUM CHLORIDE FLUSH 0.9 % IV SOLN
INTRAVENOUS | Status: AC
  Administered 2021-06-04: 10 mL via INTRAVENOUS
  Filled 2021-06-04: qty 10

## 2021-06-04 MED ORDER — REGADENOSON 0.4 MG/5ML IV SOLN
INTRAVENOUS | Status: AC
  Administered 2021-06-04: 0.4 mg via INTRAVENOUS
  Filled 2021-06-04: qty 5

## 2021-06-05 ENCOUNTER — Encounter (HOSPITAL_BASED_OUTPATIENT_CLINIC_OR_DEPARTMENT_OTHER): Payer: Self-pay | Admitting: Obstetrics & Gynecology

## 2021-06-05 ENCOUNTER — Other Ambulatory Visit: Payer: Self-pay

## 2021-06-05 NOTE — Progress Notes (Signed)
Your procedure is scheduled on 06-10-2021  Report to [name]   Call this number if you have problems the morning of Vargas  :[phone].   OUR ADDRESS IS [name].  WE ARE LOCATED IN [facility].  PLEASE BRING YOUR INSURANCE CARD AND PHOTO ID DAY OF Vargas.  ONLY ONE PERSON ALLOWED IN FACILITY WAITING AREA.                                     REMEMBER:  DO NOT EAT FOOD, CANDY GUM OR MINTS  AFTER MIDNIGHT . YOU MAY HAVE CLEAR LIQUIDS FROM MIDNIGHT UNTIL  430 am. NO CLEAR LIQUIDS AFTER 430 am  DAY OF Vargas.   YOU MAY  BRUSH YOUR TEETH MORNING OF Vargas AND RINSE YOUR MOUTH OUT, NO CHEWING GUM CANDY OR MINTS.    CLEAR LIQUID DIET   Foods Allowed                                                                     Foods Excluded  Coffee and tea, regular and decaf                             liquids that you cannot  Plain Jell-O any favor except red or purple                                           see through such as: Fruit ices (not with fruit pulp)                                     milk, soups, orange juice  Iced Popsicles                                    All solid food Carbonated beverages, regular and diet                                    Cranberry, grape and apple juices Sports drinks like Gatorade Lightly seasoned clear broth or consume(fat free) Sugar, honey syrup  Sample Menu Breakfast                                Lunch                                     Supper Cranberry juice                    Beef broth  Chicken broth Jell-O                                     Grape juice                           Apple juice Coffee or tea                        Jell-O                                      Popsicle                                                Coffee or tea                        Coffee or  tea  _____________________________________________________________________     TAKE THESE MEDICATIONS MORNING OF Vargas WITH A SIP OF WATER: megace, pepcid, flonase nasal spray.  ONE VISITOR IS ALLOWED IN WAITING ROOM ONLY DAY OF Vargas.  NO VISITOR MAY SPEND THE NIGHT.  VISITOR ARE ALLOWED TO STAY UNTIL 800 PM.                                    DO NOT WEAR JEWERLY, MAKE UP. DO NOT WEAR LOTIONS, POWDERS, PERFUMES OR NAIL POLISH. DO NOT SHAVE FOR 24 HOURS PRIOR TO DAY OF Vargas. MEN MAY SHAVE FACE AND NECK. CONTACTS, GLASSES, OR DENTURES MAY NOT BE WORN TO Vargas.                                    [name] IS NOT RESPONSIBLE  FOR ANY BELONGINGS.                                                                    [name] Kitchen           [name] - Preparing for Vargas Before Vargas, you can play an important role.  Because skin is not sterile, your skin needs to be as free of germs as possible.  You can reduce the number of germs on your skin by washing with CHG (chlorahexidine gluconate) soap before Vargas.  CHG is an antiseptic cleaner which kills germs and bonds with the skin to continue killing germs even after washing. Please DO NOT use if you have an allergy to CHG or antibacterial soaps.  If your skin becomes reddened/irritated stop using the CHG and inform your nurse when you arrive at Short Stay. Do not shave (including legs and underarms) for at least 48 hours prior to the first CHG shower.  You may shave your face/neck. Please follow these instructions carefully:  1.  Shower with  CHG Soap the night before Vargas and the  morning of Vargas.  2.  If you choose to wash your hair, wash your hair first as usual with your  normal  shampoo.  3.  After you shampoo, rinse your hair and body thoroughly to remove the  shampoo.                            4.  Use CHG as you would any other liquid soap.  You can apply chg directly  to the skin and wash                      Gently with a  scrungie or clean washcloth.  5.  Apply the CHG Soap to your body ONLY FROM THE NECK DOWN.   Do not use on face/ open                           Wound or open sores. Avoid contact with eyes, ears mouth and genitals (private parts).                       Wash face,  Genitals (private parts) with your normal soap.             6.  Wash thoroughly, paying special attention to the area where your Vargas  will be performed.  7.  Thoroughly rinse your body with warm water from the neck down.  8.  DO NOT shower/wash with your normal soap after using and rinsing off  the CHG Soap.                9.  Pat yourself dry with a clean towel.            10.  Wear clean pajamas.            11.  Place clean sheets on your bed the night of your first shower and do not  sleep with pets. Day of Vargas : Do not apply any lotions/deodorants the morning of Vargas.  Please wear clean clothes to the hospital/Vargas center.  FAILURE TO FOLLOW THESE INSTRUCTIONS MAY RESULT IN THE CANCELLATION OF YOUR Vargas PATIENT SIGNATURE_________________________________  NURSE SIGNATURE__________________________________  ________________________________________________________________________                                                        QUESTIONS [name] PRE OP NURSE PHONE [phone].

## 2021-06-05 NOTE — Progress Notes (Addendum)
Spoke w/ via phone for pre-op interview---pt Lab needs dos----  urine preg             Lab results------has lab appt 06-06-2021 for cbc bmp t & s (surgery orders pending COVID test -----positive covid 04-04-2021 epic, no retest needed Arrive at -------530 am 06-10-2021 NPO after MN NO Solid Food.  Clear liquids from MN until---430 am Med rec completed Medications to take morning of surgery -----levothyroxine, megace, pepcid, flonase  Diabetic medication -----n/a Patient instructed no nail polish to be worn day of surgery Patient instructed to bring photo id and insurance card day of surgery Patient aware to have Driver (ride ) / caregiver    spouse mike for 24 hours after surgery  Patient Special Instructions -----pt given extended stay instructions Pre-Op special Istructions -----surgery orders req with dr Dellis Filbert epic ib Patient verbalized understanding of instructions that were given at this phone interview. Patient denies shortness of breath, chest pain, fever, cough at this phone interview.   Anesthesia Review: abnormal ekg 04-04-2021, in er with chest pain 05-10-2021, patient states no chest pain or cardiac S & S since that date and no sob  PCP:dr roy fagan Cardiologist :dr Lilian Coma 05-28-2021 chart/epic Chest x-ray :05-10-2021 chart/epic Ct angio chest 05-10-2021 chart/epic EKG :05-10-2021 chart/epic Echo :11-20-2015 chart/epic Stress test:06-04-2021 chart/epic Cardiac Cath : none  Activity level: does own house and yard work can climb flight of stairs without problems Sleep Study/ CPAP :none yet  Blood Thinner/ Instructions /Last Dose:n/a ASA / Instructions/ Last Dose :  n/a

## 2021-06-06 ENCOUNTER — Telehealth: Payer: Self-pay | Admitting: *Deleted

## 2021-06-06 ENCOUNTER — Encounter (HOSPITAL_COMMUNITY)
Admission: RE | Admit: 2021-06-06 | Discharge: 2021-06-06 | Disposition: A | Discharge status: Home / Self Care | Payer: No Typology Code available for payment source | Source: Ambulatory Visit | Attending: Obstetrics & Gynecology | Admitting: Obstetrics & Gynecology

## 2021-06-06 DIAGNOSIS — Z01812 Encounter for preprocedural laboratory examination: Secondary | ICD-10-CM | POA: Insufficient documentation

## 2021-06-06 LAB — CBC
HCT: 35.6 % — ABNORMAL LOW (ref 36.0–46.0)
Hemoglobin: 12 g/dL (ref 12.0–15.0)
MCH: 29.2 pg (ref 26.0–34.0)
MCHC: 33.7 g/dL (ref 30.0–36.0)
MCV: 86.6 fL (ref 80.0–100.0)
Platelets: 282 10*3/uL (ref 150–400)
RBC: 4.11 MIL/uL (ref 3.87–5.11)
RDW: 15.6 % — ABNORMAL HIGH (ref 11.5–15.5)
WBC: 7.7 10*3/uL (ref 4.0–10.5)
nRBC: 0 % (ref 0.0–0.2)

## 2021-06-06 LAB — BASIC METABOLIC PANEL
Anion gap: 9 (ref 5–15)
BUN: 16 mg/dL (ref 6–20)
CO2: 25 mmol/L (ref 22–32)
Calcium: 9.4 mg/dL (ref 8.9–10.3)
Chloride: 106 mmol/L (ref 98–111)
Creatinine, Ser: 0.89 mg/dL (ref 0.44–1.00)
GFR, Estimated: >60 mL/min (ref >60)
Glucose, Bld: 83 mg/dL (ref 70–99)
Potassium: 3.7 mmol/L (ref 3.5–5.1)
Sodium: 140 mmol/L (ref 135–145)

## 2021-06-06 NOTE — Progress Notes (Signed)
Secure chat sent to jill hamm rn to let dr Dellis Filbert know bp at pre op today was 141/103 and pt did not take bp medication this am, pt was instructed to take bp medication daily up until day of surgery.

## 2021-06-06 NOTE — Telephone Encounter (Signed)
Patient is scheduled for XI Robotic TLH on 06/10/21.   Pre op notified office "bp 141/103 at pre op today, pt did not take bp med this am please let dr Dellis Filbert know".    Routing to Dr. Dellis Filbert

## 2021-06-06 NOTE — Telephone Encounter (Signed)
Emily Bruins, MD  You 1 hour ago (12:56 PM)     Please take BP medication with a very small sip of water on surgery day.   Call placed to patient. Left detailed message, ok per dpr. Advised per Dr. Dellis Filbert, requested return call to confirm message received.

## 2021-06-09 NOTE — Telephone Encounter (Signed)
Burnard Leigh, RN  Burnice Logan, RN Hi, Sharee Pimple  This pt takes diovan/ hct for bp.  Per anesthesia guidelines pt's are not to take diurectic's/ ace inhibitor's/ angiotensin's morning of surgery due to with anesthesia medication pt's bp go get very low.  If this pt takes her medication as prescribed just one dose missed would not have caused her bp to be high like today.  If is taking as prescribed and bp high like today then she should be referred to to her pcp for recommendation. Let me know what Dr Dellis Filbert thinks. If she still insist pt take bp med am dos I will speak with anesthesia. Please let me know.  Thanks, Langley Gauss   Dr. Dellis Filbert -please review.

## 2021-06-09 NOTE — Telephone Encounter (Signed)
Emily Bruins, MD  You Just now (8:35 AM)     Recommend skipping Anti-HTN med the morning of surgery and making an appointment with Fam MD to improve BP control.   Spoke with Langley Gauss, [facility] PAT. Advised per Dr. Dellis Filbert.   Call placed to patient, Left message to call Sharee Pimple, RN at [facility], [phone].

## 2021-06-09 NOTE — Anesthesia Preprocedure Evaluation (Addendum)
Anesthesia Evaluation  Patient identified by MRN, date of birth, ID band Patient awake    Reviewed: Allergy & Precautions, H&P , NPO status , Patient's Chart, lab work & pertinent test results  Airway Mallampati: II  TM Distance: >3 FB Neck ROM: Full    Dental no notable dental hx. (+) Dental Advisory Given   Pulmonary sleep apnea ,    Pulmonary exam normal        Cardiovascular Exercise Tolerance: Good hypertension, Pt. on medications Normal cardiovascular exam  Narrative & Impression  ? There was no ST segment deviation noted during stress. ? The study is normal. ? This is a low risk study. ? The left ventricular ejection fraction is normal (55-65%).   Normal resting and stress perfusion. No ischemia or infarction EF 65%      Neuro/Psych  Headaches, PSYCHIATRIC DISORDERS Anxiety Depression    GI/Hepatic Neg liver ROS, GERD  ,  Endo/Other  Hypothyroidism   Renal/GU negative Renal ROS  negative genitourinary   Musculoskeletal  (+) Arthritis , Osteoarthritis,    Abdominal   Peds  Hematology  (+) Blood dyscrasia, anemia ,   Anesthesia Other Findings   Reproductive/Obstetrics negative OB ROS (+) Pregnancy                            Anesthesia Physical  Anesthesia Plan  ASA: 2  Anesthesia Plan: General   Post-op Pain Management:    Induction:   PONV Risk Score and Plan: 4 or greater and Ondansetron, Dexamethasone, Midazolam and Scopolamine patch - Pre-op  Airway Management Planned: Oral ETT  Additional Equipment:   Intra-op Plan:   Post-operative Plan: Extubation in OR  Informed Consent: I have reviewed the patients History and Physical, chart, labs and discussed the procedure including the risks, benefits and alternatives for the proposed anesthesia with the patient or authorized representative who has indicated his/her understanding and acceptance.     Dental  advisory given  Plan Discussed with: Anesthesiologist and CRNA  Anesthesia Plan Comments:        Anesthesia Quick Evaluation

## 2021-06-09 NOTE — Telephone Encounter (Signed)
Spoke with patient.   Advised of recommendations as seen below per Dr. Dellis Filbert.   Patient verbalizes understanding and is agreeable.   Routing to provider for final review. Patient is agreeable to disposition. Will close encounter.

## 2021-06-10 ENCOUNTER — Encounter (HOSPITAL_BASED_OUTPATIENT_CLINIC_OR_DEPARTMENT_OTHER): Payer: Self-pay | Admitting: Obstetrics & Gynecology

## 2021-06-10 ENCOUNTER — Encounter (HOSPITAL_BASED_OUTPATIENT_CLINIC_OR_DEPARTMENT_OTHER): Payer: Self-pay | Admitting: Anesthesiology

## 2021-06-10 ENCOUNTER — Encounter (HOSPITAL_BASED_OUTPATIENT_CLINIC_OR_DEPARTMENT_OTHER)
Admission: RE | Disposition: A | Discharge status: Home / Self Care | Payer: Self-pay | Source: Ambulatory Visit | Attending: Obstetrics & Gynecology

## 2021-06-10 ENCOUNTER — Other Ambulatory Visit (HOSPITAL_COMMUNITY): Payer: Self-pay

## 2021-06-10 ENCOUNTER — Ambulatory Visit (HOSPITAL_BASED_OUTPATIENT_CLINIC_OR_DEPARTMENT_OTHER)
Admission: RE | Admit: 2021-06-10 | Discharge: 2021-06-10 | Disposition: A | Discharge status: Home / Self Care | Payer: No Typology Code available for payment source | Source: Ambulatory Visit | Attending: Obstetrics & Gynecology | Admitting: Obstetrics & Gynecology

## 2021-06-10 ENCOUNTER — Other Ambulatory Visit: Payer: Self-pay

## 2021-06-10 DIAGNOSIS — N840 Polyp of corpus uteri: Secondary | ICD-10-CM | POA: Insufficient documentation

## 2021-06-10 DIAGNOSIS — N921 Excessive and frequent menstruation with irregular cycle: Secondary | ICD-10-CM | POA: Insufficient documentation

## 2021-06-10 DIAGNOSIS — Z9889 Other specified postprocedural states: Secondary | ICD-10-CM | POA: Diagnosis present

## 2021-06-10 DIAGNOSIS — N800 Endometriosis of uterus: Secondary | ICD-10-CM | POA: Insufficient documentation

## 2021-06-10 DIAGNOSIS — S3141XA Laceration without foreign body of vagina and vulva, initial encounter: Secondary | ICD-10-CM | POA: Diagnosis not present

## 2021-06-10 DIAGNOSIS — Q625 Duplication of ureter: Secondary | ICD-10-CM | POA: Diagnosis not present

## 2021-06-10 DIAGNOSIS — Z8249 Family history of ischemic heart disease and other diseases of the circulatory system: Secondary | ICD-10-CM | POA: Insufficient documentation

## 2021-06-10 DIAGNOSIS — Z8489 Family history of other specified conditions: Secondary | ICD-10-CM | POA: Insufficient documentation

## 2021-06-10 DIAGNOSIS — D251 Intramural leiomyoma of uterus: Secondary | ICD-10-CM | POA: Diagnosis not present

## 2021-06-10 DIAGNOSIS — D259 Leiomyoma of uterus, unspecified: Secondary | ICD-10-CM | POA: Diagnosis not present

## 2021-06-10 DIAGNOSIS — Z8616 Personal history of COVID-19: Secondary | ICD-10-CM | POA: Diagnosis not present

## 2021-06-10 DIAGNOSIS — N946 Dysmenorrhea, unspecified: Secondary | ICD-10-CM | POA: Insufficient documentation

## 2021-06-10 DIAGNOSIS — Z823 Family history of stroke: Secondary | ICD-10-CM | POA: Insufficient documentation

## 2021-06-10 DIAGNOSIS — D500 Iron deficiency anemia secondary to blood loss (chronic): Secondary | ICD-10-CM | POA: Diagnosis not present

## 2021-06-10 DIAGNOSIS — N9971 Accidental puncture and laceration of a genitourinary system organ or structure during a genitourinary system procedure: Secondary | ICD-10-CM | POA: Insufficient documentation

## 2021-06-10 HISTORY — DX: Other specified abnormal findings of blood chemistry: R79.89

## 2021-06-10 HISTORY — DX: Personal history of other medical treatment: Z92.89

## 2021-06-10 HISTORY — PX: ROBOTIC ASSISTED TOTAL HYSTERECTOMY WITH BILATERAL SALPINGO OOPHERECTOMY: SHX6086

## 2021-06-10 HISTORY — DX: COVID-19: U07.1

## 2021-06-10 HISTORY — PX: CYSTOSCOPY: SHX5120

## 2021-06-10 HISTORY — DX: Fatty (change of) liver, not elsewhere classified: K76.0

## 2021-06-10 LAB — CBC
HCT: 34.3 % — ABNORMAL LOW (ref 36.0–46.0)
Hemoglobin: 11.3 g/dL — ABNORMAL LOW (ref 12.0–15.0)
MCH: 29.2 pg (ref 26.0–34.0)
MCHC: 32.9 g/dL (ref 30.0–36.0)
MCV: 88.6 fL (ref 80.0–100.0)
Platelets: 251 10*3/uL (ref 150–400)
RBC: 3.87 MIL/uL (ref 3.87–5.11)
RDW: 16.6 % — ABNORMAL HIGH (ref 11.5–15.5)
WBC: 13.6 10*3/uL — ABNORMAL HIGH (ref 4.0–10.5)
nRBC: 0 % (ref 0.0–0.2)

## 2021-06-10 LAB — POCT PREGNANCY, URINE: Preg Test, Ur: NEGATIVE

## 2021-06-10 LAB — TYPE AND SCREEN
ABO/RH(D): O POS
Antibody Screen: NEGATIVE

## 2021-06-10 SURGERY — HYSTERECTOMY, TOTAL, ROBOT-ASSISTED, LAPAROSCOPIC, WITH BILATERAL SALPINGO-OOPHORECTOMY
Anesthesia: General | Site: Urethra | Laterality: Bilateral

## 2021-06-10 MED ORDER — CELECOXIB 200 MG PO CAPS
ORAL_CAPSULE | ORAL | Status: AC
  Filled 2021-06-10: qty 1

## 2021-06-10 MED ORDER — OXYCODONE-ACETAMINOPHEN 5-325 MG PO TABS
ORAL_TABLET | ORAL | Status: AC
  Filled 2021-06-10: qty 2

## 2021-06-10 MED ORDER — LIDOCAINE HCL 2 % IJ SOLN
INTRAMUSCULAR | Status: AC
  Filled 2021-06-10: qty 20

## 2021-06-10 MED ORDER — INDIGOTINDISULFONATE SODIUM 8 MG/ML IJ SOLN
INTRAMUSCULAR | Status: DC | PRN
  Administered 2021-06-10: 40 mg via INTRAVENOUS

## 2021-06-10 MED ORDER — DEXAMETHASONE SODIUM PHOSPHATE 10 MG/ML IJ SOLN
INTRAMUSCULAR | Status: AC
  Filled 2021-06-10: qty 1

## 2021-06-10 MED ORDER — ROCURONIUM BROMIDE 10 MG/ML (PF) SYRINGE
PREFILLED_SYRINGE | INTRAVENOUS | Status: DC | PRN
  Administered 2021-06-10: 100 mg via INTRAVENOUS
  Administered 2021-06-10: 10 mg via INTRAVENOUS

## 2021-06-10 MED ORDER — ACETAMINOPHEN 500 MG PO TABS
ORAL_TABLET | ORAL | Status: AC
  Filled 2021-06-10: qty 2

## 2021-06-10 MED ORDER — ONDANSETRON HCL 4 MG/2ML IJ SOLN
INTRAMUSCULAR | Status: DC | PRN
  Administered 2021-06-10: 4 mg via INTRAVENOUS

## 2021-06-10 MED ORDER — ONDANSETRON HCL 4 MG/2ML IJ SOLN
INTRAMUSCULAR | Status: AC
  Filled 2021-06-10: qty 2

## 2021-06-10 MED ORDER — PROPOFOL 10 MG/ML IV BOLUS
INTRAVENOUS | Status: AC
  Filled 2021-06-10: qty 20

## 2021-06-10 MED ORDER — MIDAZOLAM HCL 5 MG/5ML IJ SOLN
INTRAMUSCULAR | Status: DC | PRN
  Administered 2021-06-10: 2 mg via INTRAVENOUS

## 2021-06-10 MED ORDER — FENTANYL CITRATE (PF) 250 MCG/5ML IJ SOLN
INTRAMUSCULAR | Status: AC
  Filled 2021-06-10: qty 5

## 2021-06-10 MED ORDER — SODIUM CHLORIDE (PF) 0.9 % IJ SOLN
INTRAMUSCULAR | Status: AC
  Filled 2021-06-10: qty 10

## 2021-06-10 MED ORDER — SCOPOLAMINE 1 MG/3DAYS TD PT72
MEDICATED_PATCH | TRANSDERMAL | Status: AC
  Filled 2021-06-10: qty 1

## 2021-06-10 MED ORDER — CEFAZOLIN SODIUM-DEXTROSE 2-4 GM/100ML-% IV SOLN
INTRAVENOUS | Status: AC
  Filled 2021-06-10: qty 100

## 2021-06-10 MED ORDER — CEFAZOLIN SODIUM-DEXTROSE 2-4 GM/100ML-% IV SOLN
2.0000 g | INTRAVENOUS | Status: AC
  Administered 2021-06-10: 2 g via INTRAVENOUS

## 2021-06-10 MED ORDER — LIDOCAINE HCL (PF) 2 % IJ SOLN
INTRAMUSCULAR | Status: DC | PRN
  Administered 2021-06-10: 1.5 mg/kg/h via INTRADERMAL

## 2021-06-10 MED ORDER — DEXAMETHASONE SODIUM PHOSPHATE 10 MG/ML IJ SOLN
INTRAMUSCULAR | Status: DC | PRN
  Administered 2021-06-10: 10 mg via INTRAVENOUS

## 2021-06-10 MED ORDER — FENTANYL CITRATE (PF) 100 MCG/2ML IJ SOLN: 25.0000 ug | INTRAMUSCULAR | Status: DC | PRN

## 2021-06-10 MED ORDER — PROMETHAZINE HCL 25 MG/ML IJ SOLN: 6.2500 mg | INTRAMUSCULAR | Status: DC | PRN

## 2021-06-10 MED ORDER — LIDOCAINE 2% (20 MG/ML) 5 ML SYRINGE
INTRAMUSCULAR | Status: DC | PRN
  Administered 2021-06-10: 60 mg via INTRAVENOUS

## 2021-06-10 MED ORDER — OXYCODONE-ACETAMINOPHEN 5-325 MG PO TABS
2.0000 | ORAL_TABLET | ORAL | Status: DC | PRN
  Administered 2021-06-10 (×2): 2 via ORAL

## 2021-06-10 MED ORDER — HYDROCODONE-ACETAMINOPHEN 5-325 MG PO TABS: 1.0000 | ORAL_TABLET | ORAL | Status: DC | PRN

## 2021-06-10 MED ORDER — BUPIVACAINE HCL (PF) 0.25 % IJ SOLN
INTRAMUSCULAR | Status: DC | PRN
  Administered 2021-06-10: 20 mL

## 2021-06-10 MED ORDER — LACTATED RINGERS IV SOLN: INTRAVENOUS | Status: DC

## 2021-06-10 MED ORDER — SCOPOLAMINE 1 MG/3DAYS TD PT72
1.0000 | MEDICATED_PATCH | TRANSDERMAL | Status: DC
  Administered 2021-06-10: 1.5 mg via TRANSDERMAL

## 2021-06-10 MED ORDER — LIDOCAINE HCL (PF) 2 % IJ SOLN
INTRAMUSCULAR | Status: AC
  Filled 2021-06-10: qty 5

## 2021-06-10 MED ORDER — ACETAMINOPHEN 325 MG PO TABS: 650.0000 mg | ORAL_TABLET | ORAL | Status: DC | PRN

## 2021-06-10 MED ORDER — ACETAMINOPHEN 500 MG PO TABS
1000.0000 mg | ORAL_TABLET | Freq: Once | ORAL | Status: AC
  Administered 2021-06-10: 1000 mg via ORAL

## 2021-06-10 MED ORDER — CELECOXIB 200 MG PO CAPS
200.0000 mg | ORAL_CAPSULE | Freq: Once | ORAL | Status: AC
  Administered 2021-06-10: 200 mg via ORAL

## 2021-06-10 MED ORDER — ROCURONIUM BROMIDE 10 MG/ML (PF) SYRINGE
PREFILLED_SYRINGE | INTRAVENOUS | Status: AC
  Filled 2021-06-10: qty 10

## 2021-06-10 MED ORDER — AMISULPRIDE (ANTIEMETIC) 5 MG/2ML IV SOLN: 10.0000 mg | Freq: Once | INTRAVENOUS | Status: DC | PRN

## 2021-06-10 MED ORDER — POVIDONE-IODINE 10 % EX SWAB: 2.0000 "application " | Freq: Once | CUTANEOUS | Status: DC

## 2021-06-10 MED ORDER — SUGAMMADEX SODIUM 200 MG/2ML IV SOLN
INTRAVENOUS | Status: DC | PRN
  Administered 2021-06-10: 200 mg via INTRAVENOUS

## 2021-06-10 MED ORDER — FENTANYL CITRATE (PF) 100 MCG/2ML IJ SOLN
INTRAMUSCULAR | Status: DC | PRN
  Administered 2021-06-10: 50 ug via INTRAVENOUS
  Administered 2021-06-10 (×2): 25 ug via INTRAVENOUS
  Administered 2021-06-10: 50 ug via INTRAVENOUS
  Administered 2021-06-10: 100 ug via INTRAVENOUS
  Administered 2021-06-10: 50 ug via INTRAVENOUS

## 2021-06-10 MED ORDER — OXYCODONE-ACETAMINOPHEN 7.5-325 MG PO TABS
1.0000 | ORAL_TABLET | Freq: Four times a day (QID) | ORAL | 0 refills | Status: DC | PRN
  Filled 2021-06-10: qty 20, 5d supply, fill #0

## 2021-06-10 MED ORDER — SODIUM CHLORIDE 0.9 % IR SOLN
Status: DC | PRN
  Administered 2021-06-10 (×2): 1000 mL

## 2021-06-10 MED ORDER — MIDAZOLAM HCL 2 MG/2ML IJ SOLN
INTRAMUSCULAR | Status: AC
  Filled 2021-06-10: qty 2

## 2021-06-10 MED ORDER — PROPOFOL 10 MG/ML IV BOLUS
INTRAVENOUS | Status: DC | PRN
  Administered 2021-06-10: 200 mg via INTRAVENOUS
  Administered 2021-06-10 (×2): 30 mg via INTRAVENOUS

## 2021-06-10 MED ORDER — FENTANYL CITRATE (PF) 100 MCG/2ML IJ SOLN
INTRAMUSCULAR | Status: AC
  Filled 2021-06-10: qty 2

## 2021-06-10 SURGICAL SUPPLY — 54 items
CATH FOLEY 3WAY  5CC 16FR (CATHETERS) ×4
CATH FOLEY 3WAY 5CC 16FR (CATHETERS) ×2 IMPLANT
COVER BACK TABLE 60X90IN (DRAPES) ×4 IMPLANT
COVER TIP SHEARS 8 DVNC (MISCELLANEOUS) ×2 IMPLANT
COVER TIP SHEARS 8MM DA VINCI (MISCELLANEOUS) ×4
COVER WAND RF STERILE (DRAPES) ×4 IMPLANT
DEFOGGER SCOPE WARMER CLEARIFY (MISCELLANEOUS) ×4 IMPLANT
DERMABOND ADVANCED (GAUZE/BANDAGES/DRESSINGS) ×2
DERMABOND ADVANCED .7 DNX12 (GAUZE/BANDAGES/DRESSINGS) ×2 IMPLANT
DRAPE ARM DVNC X/XI (DISPOSABLE) ×8 IMPLANT
DRAPE COLUMN DVNC XI (DISPOSABLE) ×2 IMPLANT
DRAPE DA VINCI XI ARM (DISPOSABLE) ×16
DRAPE DA VINCI XI COLUMN (DISPOSABLE) ×4
DRAPE UTILITY XL STRL (DRAPES) ×4 IMPLANT
DURAPREP 26ML APPLICATOR (WOUND CARE) ×8 IMPLANT
ELECT REM PT RETURN 9FT ADLT (ELECTROSURGICAL) ×4
ELECTRODE REM PT RTRN 9FT ADLT (ELECTROSURGICAL) ×2 IMPLANT
GAUZE PETROLATUM 1 X8 (GAUZE/BANDAGES/DRESSINGS) ×4 IMPLANT
GAUZE SPONGE 4X4 12PLY STRL (GAUZE/BANDAGES/DRESSINGS) ×4 IMPLANT
GLOVE SURG ENC MOIS LTX SZ6.5 (GLOVE) ×36 IMPLANT
GLOVE SURG UNDER POLY LF SZ6.5 (GLOVE) ×12 IMPLANT
GLOVE SURG UNDER POLY LF SZ7 (GLOVE) ×32 IMPLANT
IRRIG SUCT STRYKERFLOW 2 WTIP (MISCELLANEOUS) ×4
IRRIGATION SUCT STRKRFLW 2 WTP (MISCELLANEOUS) ×2 IMPLANT
IV NS 1000ML (IV SOLUTION) ×8
IV NS 1000ML BAXH (IV SOLUTION) ×4 IMPLANT
KIT TURNOVER CYSTO (KITS) ×4 IMPLANT
LEGGING LITHOTOMY PAIR STRL (DRAPES) ×4 IMPLANT
MANIFOLD NEPTUNE II (INSTRUMENTS) ×4 IMPLANT
OBTURATOR OPTICAL STANDARD 8MM (TROCAR) ×4
OBTURATOR OPTICAL STND 8 DVNC (TROCAR) ×2
OBTURATOR OPTICALSTD 8 DVNC (TROCAR) ×2 IMPLANT
OCCLUDER COLPOPNEUMO (BALLOONS) ×4 IMPLANT
PACK ROBOT WH (CUSTOM PROCEDURE TRAY) ×4 IMPLANT
PACK ROBOTIC GOWN (GOWN DISPOSABLE) ×4 IMPLANT
PACK TRENDGUARD 450 HYBRID PRO (MISCELLANEOUS) ×2 IMPLANT
PAD OB MATERNITY 4.3X12.25 (PERSONAL CARE ITEMS) ×4 IMPLANT
PAD PREP 24X48 CUFFED NSTRL (MISCELLANEOUS) ×4 IMPLANT
PROTECTOR NERVE ULNAR (MISCELLANEOUS) ×8 IMPLANT
SEAL CANN UNIV 5-8 DVNC XI (MISCELLANEOUS) ×8 IMPLANT
SEAL XI 5MM-8MM UNIVERSAL (MISCELLANEOUS) ×16
SET IRRIG Y TYPE TUR BLADDER L (SET/KITS/TRAYS/PACK) ×4 IMPLANT
SET TRI-LUMEN FLTR TB AIRSEAL (TUBING) ×4 IMPLANT
SUT VIC AB 4-0 PS2 27 (SUTURE) ×12 IMPLANT
SUT VIC AB 4-0 SH 27 (SUTURE) ×8
SUT VIC AB 4-0 SH 27XANBCTRL (SUTURE) ×4 IMPLANT
SUT VICRYL 0 UR6 27IN ABS (SUTURE) ×4 IMPLANT
SUT VLOC 180 0 9IN  GS21 (SUTURE) ×4
SUT VLOC 180 0 9IN GS21 (SUTURE) ×2 IMPLANT
TIP UTERINE 6.7X10CM GRN DISP (MISCELLANEOUS) ×4 IMPLANT
TOWEL OR 17X26 10 PK STRL BLUE (TOWEL DISPOSABLE) ×4 IMPLANT
TRENDGUARD 450 HYBRID PRO PACK (MISCELLANEOUS) ×4
TROCAR PORT AIRSEAL 5X120 (TROCAR) ×4 IMPLANT
WATER STERILE IRR 500ML POUR (IV SOLUTION) ×4 IMPLANT

## 2021-06-10 NOTE — Transfer of Care (Signed)
Immediate Anesthesia Transfer of Care Note  Patient: [name]  Procedure(s) Performed: Procedure(s) (LRB): XI ROBOTIC ASSISTED TOTAL LAPAROSCOPIC HYSTERECTOMY WITH BILATERAL SALPINGECTOMY, REPAIR OF LEFT SMALL LABIA MINORA TEAR (Bilateral) CYSTOSCOPY  Patient Location: PACU  Anesthesia Type: General  Level of Consciousness: awake, alert  and oriented  Airway & Oxygen Therapy: Patient Spontanous Breathing and Patient connected to nasal cannula oxygen  Post-op Assessment: Report given to PACU RN and Post -op Vital signs reviewed and stable  Post vital signs: Reviewed and stable  Complications: No apparent anesthesia complications  Last Vitals:  Vitals Value Taken Time  BP 147/96 06/10/21 1031  Temp 36.7 C 06/10/21 1030  Pulse 81 06/10/21 1041  Resp 13 06/10/21 1041  SpO2 99 % 06/10/21 1041  Vitals shown include unvalidated device data.  Last Pain:  Vitals:   06/10/21 1030  TempSrc:   PainSc: 0-No pain      Patients Stated Pain Goal: 5 (12/23/01 4961)  Complications: No notable events documented.

## 2021-06-10 NOTE — Anesthesia Postprocedure Evaluation (Signed)
Anesthesia Post Note  Patient: RAEGAN WINDERS  Procedure(s) Performed: XI ROBOTIC ASSISTED TOTAL LAPAROSCOPIC HYSTERECTOMY WITH BILATERAL SALPINGECTOMY, REPAIR OF LEFT SMALL LABIA MINORA TEAR (Bilateral: Abdomen) CYSTOSCOPY (Urethra)     Patient location during evaluation: PACU Anesthesia Type: General Level of consciousness: sedated Pain management: pain level controlled Vital Signs Assessment: post-procedure vital signs reviewed and stable Respiratory status: spontaneous breathing and respiratory function stable Cardiovascular status: stable Postop Assessment: no apparent nausea or vomiting Anesthetic complications: no   No notable events documented.  Last Vitals:  Vitals:   06/10/21 1046 06/10/21 1108  BP: (!) 153/95 (!) 151/94  Pulse: 82 88  Resp: 20 19  Temp:  (!) 36.4 C  SpO2: 100% 100%    Last Pain:  Vitals:   06/10/21 1108  TempSrc:   PainSc: 6                  Jamarion [place] DANIEL

## 2021-06-10 NOTE — Discharge Instructions (Signed)

## 2021-06-10 NOTE — H&P (Signed)
[name] is an 50 y.o. female.[id number] S/P TL   RP: XI Robotic TLH/Bilateral Salpingectomy d/t Menorrhagia/Anemia/Fibroids    HPI:  Still having daily mild vaginal bleeding on Megace.  History of menorrhagia and has tried IUD with daily bleeding x1 year and minimal improvement with Micronor and Lysteda. She is still having monthly cycles with heavy bleeding x3 days with total bleeding time of 5 days.  Severe dysmenorrhea.  Hemoglobin 7.9 on 01/15/2021 - started iron supplements 3 times daily.    Pertinent Gynecological History: Menses:  Menometrorrhagia Blood transfusions: none Sexually transmitted diseases: no past history  Last mammogram: normal  Last pap: normal    Menstrual History: No LMP recorded.    Past Medical History:  Diagnosis Date   Abnormal uterine bleeding (AUB) 10/11/2015   ADD (attention deficit disorder)    Anxiety    Arthritis    oa   COVID feb 16th or feb 17th   01-29-21 or 2-17-2022rapid home test cold symptoms x 4 days all symptoms resolved   COVID 04/04/2021   epic, asymptomatic   Elevated TSH since jan 2022   thryoid med adjusted see dr Loanne Drilling 05-02-2021   Fatty liver    no problems in several yrs as of 04-02-21 per pt   GERD (gastroesophageal reflux disease)    Heavy periods    Hypertension    Hypothyroidism    Iron deficiency anemia    Sleep apnea    Thyroid nodule    US done 2013 sees dr Ebony Hail may 20th 2022 for not sure which side no swallowing problems   Vitamin D deficiency     Past Surgical History:  Procedure Laterality Date   JOINT REPLACEMENT     tooth implant  2016   TOTAL KNEE ARTHROPLASTY Left 12/27/2018   Procedure: LEFT TOTAL KNEE ARTHROPLASTY;  Surgeon: Mcarthur Rossetti, MD;  Location: [facility];  Service: Orthopedics;  Laterality: Left;   TUBAL LIGATION  ~2000   WISDOM TOOTH EXTRACTION  age 56    Family History  Problem Relation Age of Onset   Other Son        hx/o guillian barre   Hearing loss Father    Heart  disease Maternal Grandmother    Heart disease Maternal Grandfather    Stroke Paternal Grandmother    Stroke Paternal Grandfather    Cancer Neg Hx    Diabetes Neg Hx    Breast cancer Neg Hx    Thyroid disease Neg Hx     Social History:  reports that she has never smoked. She has never used smokeless tobacco. She reports current alcohol use of about 2.0 standard drinks of alcohol per week. She reports that she does not use drugs.  Allergies: No Known Allergies  Medications Prior to Admission  Medication Sig Dispense Refill Last Dose   Ascorbic Acid (VITAMIN C PO) Take by mouth.   06/09/2021   CALCIUM PO Take by mouth.   06/09/2021   Cholecalciferol (VITAMIN D3 PO) Take by mouth.   06/09/2021   cyclobenzaprine (FLEXERIL) 10 MG tablet TAKE 1 TABLET BY MOUTH ONCE DAILY AS NEEDED. 90 tablet 1 06/09/2021   escitalopram (LEXAPRO) 10 MG tablet Take 1 tablet (10 mg total) by mouth every morning. (Patient taking differently: Take 10 mg by mouth at bedtime.) 90 tablet 4 Past Week   famotidine (PEPCID) 20 MG tablet Take 20 mg by mouth as needed for heartburn or indigestion.   Past Week   ferrous sulfate 325 (65  FE) MG EC tablet TAKE 1 TABLET (325 MG TOTAL) BY MOUTH 2 TIMES DAILY WITH A MEAL. 180 tablet 0 Past Week   fluticasone (FLONASE) 50 MCG/ACT nasal spray Place into both nostrils daily.   06/09/2021   Ibuprofen (ADVIL PO) Take by mouth. Takes 2 of 200 mg prn   Past Month   levothyroxine (SYNTHROID) 150 MCG tablet TAKE 1 TABLET (150 MCG TOTAL) BY MOUTH DAILY BEFORE BREAKFAST. 60 tablet 0 06/09/2021   MAGNESIUM PO Take by mouth. 500 mg dialy   06/09/2021   megestrol (MEGACE) 40 MG tablet Take 1 tablet (40 mg total) by mouth 3 (three) times daily until surgery. 60 tablet 0 06/09/2021   Multiple Vitamin (MULTIVITAMIN) capsule Take by mouth daily.   06/09/2021   UNABLE TO FIND L glutamine 500 mg daily   06/09/2021   valsartan-hydrochlorothiazide (DIOVAN-HCT) 320-25 MG tablet Take 1 tablet by mouth daily. 90  tablet 4 06/09/2021    REVIEW OF SYSTEMS: A ROS was performed and pertinent positives and negatives are included in the history.  GENERAL: No fevers or chills. HEENT: No change in vision, no earache, sore throat or sinus congestion. NECK: No pain or stiffness. CARDIOVASCULAR: No chest pain or pressure. No palpitations. PULMONARY: No shortness of breath, cough or wheeze. GASTROINTESTINAL: No abdominal pain, nausea, vomiting or diarrhea, melena or bright red blood per rectum. GENITOURINARY: No urinary frequency, urgency, hesitancy or dysuria. MUSCULOSKELETAL: No joint or muscle pain, no back pain, no recent trauma. DERMATOLOGIC: No rash, no itching, no lesions. ENDOCRINE: No polyuria, polydipsia, no heat or cold intolerance. No recent change in weight. HEMATOLOGICAL: No anemia or easy bruising or bleeding. NEUROLOGIC: No headache, seizures, numbness, tingling or weakness. PSYCHIATRIC: No depression, no loss of interest in normal activity or change in sleep pattern.     Blood pressure (!) 140/100, pulse 87, temperature 98.4 F (36.9 C), temperature source Oral, resp. rate 17, height 5\' 6"  (1.676 m), weight 110.5 kg, SpO2 100 %.  Physical Exam:  Results for orders placed or performed during the hospital encounter of 06/10/21 (from the past 24 hour(s))  Pregnancy, urine POC     Status: None   Collection Time: 06/10/21  5:36 AM  Result Value Ref Range   Preg Test, Ur NEGATIVE NEGATIVE   Hb 12.0 on 06/06/2021   Pelvic US 01/23/2021:  Retroverted uterus, bulky, fibroids x2, anterior to endometrium - 20 x 25 mm with 2 mm calcification, posterior to endometrium - 21 x 21 mm, both intramural Thin, symmetrical endometrium - 1.93 mm.  Anterior fibroid appears to distort endometrium Both ovaries normal size, right ovary with dominant follicle No adnexal masses No free fluid          Assessment/Plan:  50 y.o. [id number]   1. Menorrhagia with regular cycle Severe menorrhagia with anemia.  Last  hemoglobin 12.0. Patient has been taking iron supplements and Megace.  Less bleeding since then.   Continue with Megace and iron supplements until surgery.   2. Fibroids, intramural Small intramural fibroids with a bulky uterus compatible with adenomyosis.  Decision to proceed with XI robotic total laparoscopic hysterectomy with bilateral salpingectomy.  Preop preparation, procedure with risks and benefits as well as postop precautions thoroughly reviewed with patient.  Patient voiced understanding and agreement.   3. Iron deficiency anemia due to chronic blood loss Continue with iron supplements. - CBC  Patient was counseled as to the risk of surgery to include the following:   1. Infection (prohylactic antibiotics will be administered)   2. DVT/Pulmonary Embolism (prophylactic pneumo compression stockings will be used)   3.Trauma to internal organs requiring additional surgical procedure to repair any injury to internal organs requiring perhaps additional hospitalization days.   4.Hemmorhage requiring transfusion and blood products which carry risks such as anaphylactic reaction, hepatitis and AIDS   Patient had received literature information on the procedure scheduled and all her questions were answered and fully accepts all risk.  Marie-Lyne Kester Stimpson 06/10/2021, 6:59 AM

## 2021-06-10 NOTE — Anesthesia Procedure Notes (Signed)
Procedure Name: Intubation Date/Time: 06/10/2021 7:34 AM Performed by: Rogers Blocker, CRNA Pre-anesthesia Checklist: Patient identified, Emergency Drugs available, Suction available and Patient being monitored Patient Re-evaluated:Patient Re-evaluated prior to induction Oxygen Delivery Method: Circle System Utilized Preoxygenation: Pre-oxygenation with 100% oxygen Induction Type: IV induction Ventilation: Mask ventilation without difficulty Laryngoscope Size: Mac and 3 Grade View: Grade I Tube type: Oral Number of attempts: 1 Airway Equipment and Method: Stylet Placement Confirmation: ETT inserted through vocal cords under direct vision, positive ETCO2 and breath sounds checked- equal and bilateral Secured at: 21 cm Tube secured with: Tape Dental Injury: Teeth and Oropharynx as per pre-operative assessment

## 2021-06-10 NOTE — Op Note (Signed)
Operative Note  06/10/2021  10:24 AM  PATIENT:  [name]  50 y.o. 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  PROCEDURE:  Procedure(s): XI ROBOTIC ASSISTED TOTAL LAPAROSCOPIC HYSTERECTOMY WITH BILATERAL SALPINGECTOMY, REPAIR OF LEFT SMALL LABIA MINORA TEAR CYSTOSCOPY  SURGEON:  Surgeon(s): Princess Bruins, MD Nunzio Cobbs, MD  ANESTHESIA:   general  FINDINGS: Enlarged uterus with fibroids.  Status post bilateral tubal sterilization with Filshie clips.  Normal bilateral ovaries (Right Filshie clip embedded at the right ovary).  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  DESCRIPTION OF OPERATION: Under general anesthesia with endotracheal intubation, the patient is in lithotomy position.  She is prepped with Hibiclens on the suprapubic, vulvar and vaginal areas and with DuraPrep on the abdominal area.  Timeout is done.  Patient is draped as usual.  The Foley catheter is put in place in the bladder.  A #10 Rumi with the medium Koh ring are put in place in the uterus and at the cervix.  All other instruments are removed.  We go to the abdomen.  We infiltrated the supraumbilical area with Marcaine one quarter plain.  A 1.5 cm incision is done with the scalpel.  The aponeurosis is grasped with cokers.  The aponeurosis is opened with Mayo scissors.  The parietal peritoneum is opened bluntly with the fingers.  A pursestring stitch of Vicryl 0 is done on the aponeurosis.  The Sheryle Hail is inserted under direct vision and up pneumoperitoneum is created with CO2.  The camera is inserted to inspect the abdominal pelvic cavities and abdominal wall.  An omental adhesion is present at the right abdomen but is laterally and off that it will not interfere with port placement.  The  left aspect of the abdomen is free of adhesions.  All ports are put in place under direct vision after making a small incisions with the scalp L.  The skin was infiltrated with Marcaine one quarter plain at all sites.  2 robotic ports were inserted in line with the umbilicus on the right side of the abdomen and 1 robotic ports at the distal left side in line with the umbilicus.  The assistant port is inserted on the left side in line with the umbilicus proximally.  The patient is brought in 31 degree Trendelenburg and tolerates it well.  The robot is docked.  Robotic instruments are inserted under direct vision with the pro grasp in the fourth arm, the scissors in the third arm, the camera in the second arm and the long bipolar clamp in the first arm.  We go to the console.     Thorough inspection of the pelvic cavity is done.  The uterus is enlarged with fibroids measuring about 12 cm in the longest diameter.  Both tubes are status post tubal sterilization with Filshie clamps.  The Filshie clamps is at the tube on the left side and embedded in the right ovary on the right side.  Both ovaries are otherwise normal in size and appearance.  We visualized peristalsis at both ureters which are normal anatomic position.  Pictures are taken of all the anatomy in the pelvis.  We start on the left side with cauterization and section of the left mesosalpinx.  The left distal tube is removed from the pelvic cavity as well as the left Filshie clamp.  We then  cauterized and section the left utero-ovarian ligament.  We cauterized and sectioned the left round ligament.  We opened the broad ligament close to the uterus.  The visceral peritoneum is opened anteriorly to the midline.  We proceeded the same way on the right side with cauterization and section of the right mesosalpinx.  The right distal tube is removed from the pelvis.  We will leave the right Filshie clamp embedded at the right ovary, as it is in a safe location and  the risk of causing bleeding at the right ovary would be higher than the benefits.  We cauterized and sectioned the right utero-ovarian ligament.  We cauterized and section the right round ligament.  We then opened the right broad ligament close to the uterus.  We finished opening the visceral peritoneum anteriorly and reclined the bladder well past the [facility] ring.  We then cauterize and section the right uterine artery.  We cauterized and sectioned the left uterine artery.  The vaginal occluder is inflated.  We open the vagina at its superior aspect over the [facility] ring anteriorly and on both sides, and finished with opening at the posterior aspect.  The uterus is completely detached with the cervix and the proximal tubes bilaterally.  The specimen is passed vaginally and everything is sent together to pathology.  Hemostasis is adequate at the vaginal vault and at both uterine arteries.  We change robotic instruments to the cutting needle driver in the third arm and the long tip in the first arm.  We used a V-Loc 0 at 9 inches to close the vaginal vault.  We started at the right angle and do a running suture all the way to the left angle of the vagina and back past the middle.  The needle is removed from the pelvic cavity.  We irrigate and suction the pelvic cavity.  Hemostasis is adequate at all levels.  We at time to visualize peristalsis at the ureters but no peristalsis is seen.  Urine is clear.  The decision is made to proceed with a diagnostic cystoscopy.  Pictures are taken of the pelvis.  All robotic instruments are removed.  Robotic and laparoscopic instruments are removed.  The robot is undocked.  The bed is repositioned in a flat position.      The Foley is removed from the bladder.  We proceed with a cystoscopy with a 70 degree angle cystoscope.  Indigocarmine was injected to visualize the ureteral jets better.  We surprisingly notice 2 ureteral jets on either side of the trigone.  All the jets are strong.   Incidental finding of double ureters bilaterally.  The patient will be referred to urology for further investigation as an outpatient.  The bladder is visualized completely and is intact with no lesion seen.  The cystoscope is therefore removed.  The urethra appears normal as we remove the cystoscope.  The bladder is emptied.      We noticed a left labia minora laceration.  This is repaired with separate stitches of Vicryl 4-0.  Good hemostasis.      we go back to laparoscopy time.  We irrigate and suction the abdominopelvic cavities.  Hemostasis is adequate at all levels.  We remove laparoscopic instruments.  All ports are removed under direct vision.  The CO2 is evacuated.  The pursestring stitch is attached at the supraumbilical incision.  All skin incisions are closed with a Vicryl 4-0 in a subcuticular tubular stitch.  Dermabond is added on all incisions.  The patient is brought to recovery room in good and stable status.  ESTIMATED BLOOD LOSS: 50 mL   Intake/Output Summary (Last 24 hours) at 06/10/2021 1024 Last data filed at 06/10/2021 2233 Gross per 24 hour  Intake 1100 ml  Output 100 ml  Net 1000 ml     BLOOD ADMINISTERED:none   LOCAL MEDICATIONS USED:  MARCAINE     SPECIMEN:  Source of Specimen:  Uterus with cervix and bilateral tubes.  Left Filshie clip.    DISPOSITION OF SPECIMEN:  PATHOLOGY  COUNTS:  YES  PLAN OF CARE: Transfer to PACU  Marie-Lyne LavoieMD10:24 AM

## 2021-06-11 ENCOUNTER — Telehealth: Payer: Self-pay | Admitting: *Deleted

## 2021-06-11 ENCOUNTER — Encounter (HOSPITAL_BASED_OUTPATIENT_CLINIC_OR_DEPARTMENT_OTHER): Payer: Self-pay | Admitting: Obstetrics & Gynecology

## 2021-06-11 NOTE — Telephone Encounter (Signed)
Referral faxed to [facility] to [phone]. They will call patient to schedule.

## 2021-06-11 NOTE — Telephone Encounter (Signed)
-----   Message from Princess Bruins, MD sent at 06/10/2021  3:21 PM EDT ----- Regarding: Refer to Urology XI Robotic TLH/Salpingectomy today.  Cystoscopy done to confirm Ureteral jets bilaterally.  Incidental finding of double ureters bilaterally.  4 good ureteral jets on Cystoscopy.  Please refer to Urology for investigation.

## 2021-06-12 LAB — SURGICAL PATHOLOGY

## 2021-06-23 ENCOUNTER — Encounter: Payer: Self-pay | Admitting: Obstetrics & Gynecology

## 2021-06-23 ENCOUNTER — Ambulatory Visit: Payer: No Typology Code available for payment source | Admitting: Adult Health

## 2021-06-23 ENCOUNTER — Encounter: Payer: Self-pay | Admitting: Adult Health

## 2021-06-23 ENCOUNTER — Other Ambulatory Visit: Payer: Self-pay

## 2021-06-23 ENCOUNTER — Ambulatory Visit (INDEPENDENT_AMBULATORY_CARE_PROVIDER_SITE_OTHER): Payer: No Typology Code available for payment source | Admitting: Obstetrics & Gynecology

## 2021-06-23 VITALS — BP 130/82 | HR 88 | Resp 14 | Ht 66.0 in | Wt 244.0 lb

## 2021-06-23 DIAGNOSIS — Z09 Encounter for follow-up examination after completed treatment for conditions other than malignant neoplasm: Secondary | ICD-10-CM

## 2021-06-23 DIAGNOSIS — G4733 Obstructive sleep apnea (adult) (pediatric): Secondary | ICD-10-CM

## 2021-06-23 NOTE — Progress Notes (Signed)
@Patient  ID: [name], female    DOB: 03/21/1971, 50 y.o.   MRN: [id number]  Chief Complaint  Patient presents with   Follow-up    Referring provider: Asencion Noble, MD  HPI: 50 year old female followed for severe obstructive sleep apnea  TEST/EVENTS :  HST 03/25/2021 AHI 72/hour, lowest desaturation 86%, ODI 87/hour 60% snoring  06/23/2021 Follow up : OSA  Patient presents for a follow-up visit.  Patient was seen in May for sleep consult.  She had a sleep study in April 2022 that showed severe obstructive sleep apnea with AHI at 72/hour.  Lowest desaturation 86%.  Patient was started on CPAP.  Patient says she is trying to get used to her CPAP.  As she is getting in about 4 to 6 hours each night.  Patient is on auto CPAP 5 to 15 cm H2O.  CPAP download shows 80% compliance.  Daily average usage at 4 hours.  AHI 2.1. Currently using nasal pillows .  She feels that CPAP has really helped her.  She has noticed a significant decrease in her daytime sleepiness.  Working on weight loss.  She is starting with a personal trainer next month.  We discussed healthy weight loss.  Had hysterectomy 2 weeks ago, has some residual soreness in nose from nasal swab.  We discussed using saline spray and saline nasal gel.   No Known Allergies  Immunization History  Administered Date(s) Administered   Influenza Split 09/13/2020   Influenza,inj,Quad PF,6+ Mos 09/16/2018   Influenza-Unspecified 10/02/2016   PFIZER(Purple Top)SARS-COV-2 Vaccination 12/28/2019, 01/18/2020, 10/28/2020    Past Medical History:  Diagnosis Date   Abnormal uterine bleeding (AUB) 10/11/2015   ADD (attention deficit disorder)    Anxiety    Arthritis    oa   COVID feb 16th or feb 17th   01-29-21 or 2-17-2022rapid home test cold symptoms x 4 days all symptoms resolved   COVID 04/04/2021   epic, asymptomatic   Elevated TSH since jan 2022   thryoid med adjusted see dr Loanne Drilling 05-02-2021   Fatty liver    no problems in  several yrs as of 04-02-21 per pt   GERD (gastroesophageal reflux disease)    Heavy periods    Hypertension    Hypothyroidism    Iron deficiency anemia    Sleep apnea    Thyroid nodule    US done 2013 sees dr Ebony Hail may 20th 2022 for not sure which side no swallowing problems   Vitamin D deficiency     Tobacco History: Social History   Tobacco Use  Smoking Status Never  Smokeless Tobacco Never   Counseling given: Not Answered   Outpatient Medications Prior to Visit  Medication Sig Dispense Refill   Ascorbic Acid (VITAMIN C PO) Take by mouth.     CALCIUM PO Take by mouth.     Cholecalciferol (VITAMIN D3 PO) Take by mouth.     cyclobenzaprine (FLEXERIL) 10 MG tablet TAKE 1 TABLET BY MOUTH ONCE DAILY AS NEEDED. 90 tablet 1   escitalopram (LEXAPRO) 10 MG tablet Take 1 tablet (10 mg total) by mouth every morning. (Patient taking differently: Take 10 mg by mouth at bedtime.) 90 tablet 4   famotidine (PEPCID) 20 MG tablet Take 20 mg by mouth as needed for heartburn or indigestion.     fluticasone (FLONASE) 50 MCG/ACT nasal spray Place into both nostrils daily.     Ibuprofen (ADVIL PO) Take by mouth. Takes 2 of 200 mg prn  levothyroxine (SYNTHROID) 150 MCG tablet TAKE 1 TABLET (150 MCG TOTAL) BY MOUTH DAILY BEFORE BREAKFAST. 60 tablet 0   MAGNESIUM PO Take by mouth. 500 mg dialy     Multiple Vitamin (MULTIVITAMIN) capsule Take by mouth daily.     UNABLE TO FIND L glutamine 500 mg daily     valsartan-hydrochlorothiazide (DIOVAN-HCT) 320-25 MG tablet Take 1 tablet by mouth daily. 90 tablet 4   ferrous sulfate 325 (65 FE) MG EC tablet TAKE 1 TABLET (325 MG TOTAL) BY MOUTH 2 TIMES DAILY WITH A MEAL. (Patient not taking: Reported on 06/23/2021) 180 tablet 0   oxyCODONE-acetaminophen (PERCOCET) 7.5-325 MG tablet Take 1 tablet by mouth every 6 (six) hours as needed for severe pain. (Patient not taking: Reported on 06/23/2021) 20 tablet 0   No facility-administered medications prior to  visit.     Review of Systems:   Constitutional:   No  weight loss, night sweats,  Fevers, chills, fatigue, or  lassitude.  HEENT:   No headaches,  Difficulty swallowing,  Tooth/dental problems, or  Sore throat,                No sneezing, itching, ear ache, nasal congestion, post nasal drip,   CV:  No chest pain,  Orthopnea, PND, swelling in lower extremities, anasarca, dizziness, palpitations, syncope.   GI  No heartburn, indigestion, abdominal pain, nausea, vomiting, diarrhea, change in bowel habits, loss of appetite, bloody stools.   Resp: No shortness of breath with exertion or at rest.  No excess mucus, no productive cough,  No non-productive cough,  No coughing up of blood.  No change in color of mucus.  No wheezing.  No chest wall deformity  Skin: no rash or lesions.  GU: no dysuria, change in color of urine, no urgency or frequency.  No flank pain, no hematuria   MS:  No joint pain or swelling.  No decreased range of motion.  No back pain.    Physical Exam  BP 122/78 (BP Location: Left Arm, Cuff Size: Large)   Pulse 83   Temp 97.9 F (36.6 C) (Temporal)   Ht 5\' 6"  (1.676 m)   Wt 244 lb 6.4 oz (110.9 kg)   SpO2 98%   BMI 39.45 kg/m   GEN: A/Ox3; pleasant , NAD, well nourished    HEENT:  Wilder/AT,   NOSE-clear, THROAT-clear, no lesions, no postnasal drip or exudate noted.  Class III - IV MP airway  NECK:  Supple w/ fair ROM; no JVD; normal carotid impulses w/o bruits; no thyromegaly or nodules palpated; no lymphadenopathy.    RESP  Clear  P & A; w/o, wheezes/ rales/ or rhonchi. no accessory muscle use, no dullness to percussion  CARD:  RRR, no m/r/g, no peripheral edema, pulses intact, no cyanosis or clubbing.  GI:   Soft & nt; nml bowel sounds; no organomegaly or masses detected.   Musco: Warm bil, no deformities or joint swelling noted.   Neuro: alert, no focal deficits noted.    Skin: Warm, no lesions or rashes    Lab Results:     BNP No results  found for: BNP  ProBNP No results found for: PROBNP  Imaging:   No flowsheet data found.  No results found for: NITRICOXIDE      Assessment & Plan:   No problem-specific Assessment & Plan notes found for this encounter.     Rexene Edison, NP 06/23/2021

## 2021-06-23 NOTE — Assessment & Plan Note (Signed)
Continue to work on healthy weight loss 

## 2021-06-23 NOTE — Patient Instructions (Addendum)
Continue on CPAP At bedtime   Keep up good work  Try to wear CPAP for at least 6hr each night .  Do not drive if sleepy  Activity as tolerated.  Work on healthy weight .  Saline nasal spray and gel As needed   Follow up with Dr. Elsworth Soho  or Melisse Caetano NP in 6 months and As needed

## 2021-06-23 NOTE — Progress Notes (Signed)
    [name] [birth year] [id number]        50 y.o.  G3P2012   RP: Postop XI Robotic TLH/Bilateral Salpingectomy 06/10/2021  HPI: Very good postop evolution with no abdominal pelvic pain.  Skin incision on the abdomen well closed.  Patient felt the stitches at the left vulvar tear a few times with motion, but not painful.  No vaginal bleeding or discharge.  No fever.  Urine and bowel movements normal.   OB History  Gravida Para Term Preterm AB Living  3 2 2   1 2   SAB IAB Ectopic Multiple Live Births    1          # Outcome Date GA Lbr Len/2nd Weight Sex Delivery Anes PTL Lv  3 Term           2 Term           1 IAB             Past medical history,surgical history, problem list, medications, allergies, family history and social history were all reviewed and documented in the EPIC chart.   Directed ROS with pertinent positives and negatives documented in the history of present illness/assessment and plan.  Exam:  Vitals:   06/23/21 1340  BP: 130/82  Pulse: 88  Resp: 14  Weight: 244 lb (110.7 kg)  Height: 5\' 6"  (1.676 m)   General appearance:  Normal  Abdomen: Normal.  Incisions well closed.  No discharge.  No erythema, induration or tenderness.  Gynecologic exam: Vulva with left labial tear healing well.  Speculum:  Vaginal vault well closed.  No bleeding.  No abnormal d/c.  Patho 06/10/2021: SURGICAL PATHOLOGY  CASE: [id number]  PATIENT: [name]  Surgical Pathology Report      Clinical History: Severe menorrhagia, secondary anemia, adenomyosis,  fibroids ([facility])      FINAL MICROSCOPIC DIAGNOSIS:   A. UTERUS, CERVIX, BILATERAL FALLOPIAN TUBES:  - Uterus with leiomyomata, largest measuring 3.5 cm  - Benign endometrial polyp, 2.2 cm  - Adenomyosis  - Benign inactive endometrium  - Benign unremarkable cervix  - Benign unremarkable bilateral fallopian tubes  - No evidence of malignancy      Assessment/Plan:  50 y.o. [id number]   1. Status  post gynecological surgery, follow-up exam  Excellent postop evolution without complication.  Pathology benign.  Recommend waiting until 6 weeks postop before resuming full physical activity at the gym.  No intercourse until 8 weeks postop.  Otherwise will slowly progress with physical activities.  Follow-up in 4 to 5 weeks.  Princess Bruins MD, 7:26 PM 06/23/2021

## 2021-06-23 NOTE — Assessment & Plan Note (Signed)
Severe obstructive sleep apnea with significant improvement on CPAP. CPAP download shows good compliance and control. Continue on current settings  Plan  Patient Instructions  Continue on CPAP At bedtime   Keep up good work  Try to wear CPAP for at least 6hr each night .  Do not drive if sleepy  Activity as tolerated.  Work on healthy weight .  Saline nasal spray and gel As needed   Follow up with Dr. Elsworth Soho  or Kadin Bera NP in 6 months and As needed

## 2021-07-08 NOTE — Telephone Encounter (Addendum)
[facility] left message for patient on 06/13/21, patient has not called back. I called patient and left detailed message on voicemail to please called urology and schedule.   Encounter closed as patient has received a voicemail from urology and this office about scheduling.

## 2021-07-22 ENCOUNTER — Other Ambulatory Visit: Payer: Self-pay | Admitting: Nurse Practitioner

## 2021-07-22 ENCOUNTER — Other Ambulatory Visit (HOSPITAL_BASED_OUTPATIENT_CLINIC_OR_DEPARTMENT_OTHER): Payer: Self-pay

## 2021-07-22 DIAGNOSIS — E039 Hypothyroidism, unspecified: Secondary | ICD-10-CM

## 2021-07-22 NOTE — Telephone Encounter (Signed)
[name] you checked TSH level in 01/2021 and then her PCP checked in 03/2021 it was normal.

## 2021-07-23 ENCOUNTER — Other Ambulatory Visit (HOSPITAL_BASED_OUTPATIENT_CLINIC_OR_DEPARTMENT_OTHER): Payer: Self-pay

## 2021-07-23 MED ORDER — LEVOTHYROXINE SODIUM 150 MCG PO TABS
ORAL_TABLET | ORAL | 0 refills | Status: DC
  Filled 2021-07-23: qty 60, 60d supply, fill #0

## 2021-08-04 ENCOUNTER — Encounter: Payer: Self-pay | Admitting: Obstetrics & Gynecology

## 2021-08-04 ENCOUNTER — Ambulatory Visit (INDEPENDENT_AMBULATORY_CARE_PROVIDER_SITE_OTHER): Payer: No Typology Code available for payment source | Admitting: Obstetrics & Gynecology

## 2021-08-04 ENCOUNTER — Other Ambulatory Visit: Payer: Self-pay

## 2021-08-04 VITALS — BP 138/88 | HR 86 | Resp 16 | Ht 66.0 in | Wt 257.0 lb

## 2021-08-04 DIAGNOSIS — Z09 Encounter for follow-up examination after completed treatment for conditions other than malignant neoplasm: Secondary | ICD-10-CM

## 2021-08-04 NOTE — Progress Notes (Signed)
    [name] 07-09-1971 [id number]        50 y.o.  G3P2012    RP: Postop XI Robotic TLH/Bilateral Salpingectomy 06/10/2021   HPI: Very good postop evolution with no abdominal pelvic pain.  Skin incision on the abdomen well closed.  No vaginal bleeding or discharge.  No fever.  Urine and bowel movements normal.  Started back with fitness activities.   OB History  Gravida Para Term Preterm AB Living  '3 2 2   1 2  '$ SAB IAB Ectopic Multiple Live Births    1          # Outcome Date GA Lbr Len/2nd Weight Sex Delivery Anes PTL Lv  3 Term           2 Term           1 IAB             Past medical history,surgical history, problem list, medications, allergies, family history and social history were all reviewed and documented in the EPIC chart.   Directed ROS with pertinent positives and negatives documented in the history of present illness/assessment and plan.  Exam:  Vitals:   08/04/21 1022  BP: 138/88  Pulse: 86  Resp: 16  Weight: 257 lb (116.6 kg)  Height: '5\' 6"'$  (1.676 m)   General appearance:  Normal  Abdomen: Normal.  Incisions well healed.  Gynecologic exam: Vulva normal.  Bimanual exam:  Vaginal vault well closed, no induration, non-tender.  No pelvic mass felt.  Normal secretions, no blood.   Assessment/Plan:  50 y.o. [id number]   1. Status post gynecological surgery, follow-up exam Excellent postop healing with no complication.  Patient has resumed fitness activities.  Can now resume sexual activities.  We will follow-up here with Marny Lowenstein nurse practitioner for annual gynecologic exam.  2. Morbid obesity ([place])  Started with fitness activities.  Lower calorie/carb diet recommended.  Intermittent fasting discussed, patient will further read about it and decide if wants to use it for weight loss and other health benefits.  Princess Bruins MD, 10:31 AM 08/04/2021

## 2021-08-26 ENCOUNTER — Ambulatory Visit: Payer: No Typology Code available for payment source | Admitting: Nurse Practitioner

## 2021-08-26 ENCOUNTER — Encounter: Payer: Self-pay | Admitting: Nurse Practitioner

## 2021-08-26 ENCOUNTER — Other Ambulatory Visit: Payer: Self-pay

## 2021-08-26 ENCOUNTER — Other Ambulatory Visit (HOSPITAL_BASED_OUTPATIENT_CLINIC_OR_DEPARTMENT_OTHER): Payer: Self-pay

## 2021-08-26 VITALS — BP 124/80

## 2021-08-26 DIAGNOSIS — N951 Menopausal and female climacteric states: Secondary | ICD-10-CM

## 2021-08-26 DIAGNOSIS — Z79890 Hormone replacement therapy (postmenopausal): Secondary | ICD-10-CM

## 2021-08-26 MED ORDER — ESTRADIOL 0.05 MG/24HR TD PTTW
1.0000 | MEDICATED_PATCH | TRANSDERMAL | 3 refills | Status: DC
  Filled 2021-08-26: qty 24, 84d supply, fill #0
  Filled 2021-11-13: qty 24, 84d supply, fill #1

## 2021-08-26 NOTE — Progress Notes (Signed)
   Acute Office Visit  Subjective:    Patient ID: [name], female    DOB: 1971-08-13, 50 y.o.   MRN: [id number]   HPI 50 y.o. presents today to discuss menopausal symptoms. She is experiencing severe night sweats, hot flashes, and mood changes.She had robotic assisted hysterectomy with bilateral salpingectomies. She had some menopausal symptoms prior, but these have increased significantly over the last month. She has been struggling with weight gain and has recently started a nutrition program.    Review of Systems  Constitutional: Negative.   Endocrine: Positive for heat intolerance.  Psychiatric/Behavioral:         Mood changes      Objective:    Physical Exam Constitutional:      Appearance: Normal appearance.  GU: Not indicated  BP 124/80   LMP 03/14/2021  Wt Readings from Last 3 Encounters:  08/04/21 257 lb (116.6 kg)  06/23/21 244 lb (110.7 kg)  06/23/21 244 lb 6.4 oz (110.9 kg)        Assessment & Plan:   Problem List Items Addressed This Visit   None Visit Diagnoses     Hormone replacement therapy    -  Primary   Relevant Medications   estradiol (VIVELLE-DOT) 0.05 MG/24HR patch (Start on 08/28/2021)   Menopausal symptoms       Relevant Medications   estradiol (VIVELLE-DOT) 0.05 MG/24HR patch (Start on 08/28/2021)      Plan: We discussed management of menopausal symptoms with hormone replacement therapy. She has done lots of research on her own and feels she would benefit from this. We discussed the benefits of cardiac and bone health and of course symptom management. We also discussed the risks of blood clots, heart attack, stroke, and breast cancer. She understands and would like to continue. HTN well controlled on Diovan-HCT. Recommend checking BP routinely. All questions answered.      [name] DNP, 5:01 PM 08/26/2021

## 2021-09-03 ENCOUNTER — Other Ambulatory Visit (HOSPITAL_BASED_OUTPATIENT_CLINIC_OR_DEPARTMENT_OTHER): Payer: Self-pay

## 2021-09-03 MED ORDER — CYCLOBENZAPRINE HCL 10 MG PO TABS
ORAL_TABLET | ORAL | 1 refills | Status: DC
  Filled 2021-09-03: qty 90, 90d supply, fill #0
  Filled 2021-12-02: qty 90, 90d supply, fill #1

## 2021-09-16 ENCOUNTER — Other Ambulatory Visit: Payer: Self-pay | Admitting: Internal Medicine

## 2021-09-16 DIAGNOSIS — Z1231 Encounter for screening mammogram for malignant neoplasm of breast: Secondary | ICD-10-CM

## 2021-10-07 DIAGNOSIS — Z1231 Encounter for screening mammogram for malignant neoplasm of breast: Secondary | ICD-10-CM

## 2021-10-10 ENCOUNTER — Ambulatory Visit: Payer: No Typology Code available for payment source | Admitting: Endocrinology

## 2021-11-04 ENCOUNTER — Ambulatory Visit
Admission: RE | Admit: 2021-11-04 | Discharge: 2021-11-04 | Disposition: A | Discharge status: Home / Self Care | Payer: No Typology Code available for payment source | Source: Ambulatory Visit | Attending: Internal Medicine | Admitting: Internal Medicine

## 2021-11-04 ENCOUNTER — Other Ambulatory Visit: Payer: Self-pay

## 2021-11-04 DIAGNOSIS — Z1231 Encounter for screening mammogram for malignant neoplasm of breast: Secondary | ICD-10-CM

## 2021-11-11 ENCOUNTER — Other Ambulatory Visit (HOSPITAL_BASED_OUTPATIENT_CLINIC_OR_DEPARTMENT_OTHER): Payer: Self-pay

## 2021-11-12 ENCOUNTER — Other Ambulatory Visit (HOSPITAL_BASED_OUTPATIENT_CLINIC_OR_DEPARTMENT_OTHER): Payer: Self-pay

## 2021-11-13 ENCOUNTER — Other Ambulatory Visit (HOSPITAL_BASED_OUTPATIENT_CLINIC_OR_DEPARTMENT_OTHER): Payer: Self-pay

## 2021-12-02 ENCOUNTER — Other Ambulatory Visit (HOSPITAL_BASED_OUTPATIENT_CLINIC_OR_DEPARTMENT_OTHER): Payer: Self-pay

## 2021-12-23 ENCOUNTER — Other Ambulatory Visit (HOSPITAL_BASED_OUTPATIENT_CLINIC_OR_DEPARTMENT_OTHER): Payer: Self-pay

## 2021-12-23 ENCOUNTER — Other Ambulatory Visit: Payer: Self-pay | Admitting: Nurse Practitioner

## 2021-12-23 DIAGNOSIS — E039 Hypothyroidism, unspecified: Secondary | ICD-10-CM

## 2021-12-24 ENCOUNTER — Other Ambulatory Visit (HOSPITAL_BASED_OUTPATIENT_CLINIC_OR_DEPARTMENT_OTHER): Payer: Self-pay

## 2021-12-24 MED ORDER — LEVOTHYROXINE SODIUM 150 MCG PO TABS
150.0000 ug | ORAL_TABLET | Freq: Every day | ORAL | 12 refills | Status: DC
  Filled 2021-12-24: qty 90, 90d supply, fill #0
  Filled 2022-04-29: qty 90, 90d supply, fill #1
  Filled 2022-08-12: qty 90, 90d supply, fill #2

## 2021-12-25 ENCOUNTER — Ambulatory Visit (INDEPENDENT_AMBULATORY_CARE_PROVIDER_SITE_OTHER): Payer: No Typology Code available for payment source | Admitting: Adult Health

## 2021-12-25 ENCOUNTER — Encounter: Payer: Self-pay | Admitting: Adult Health

## 2021-12-25 ENCOUNTER — Other Ambulatory Visit: Payer: Self-pay

## 2021-12-25 DIAGNOSIS — G4733 Obstructive sleep apnea (adult) (pediatric): Secondary | ICD-10-CM

## 2021-12-25 NOTE — Progress Notes (Signed)
@Patient  ID: [name], female    DOB: Jul 20, 1971, 51 y.o.   MRN: [id number]  Chief Complaint  Patient presents with   Follow-up    Referring provider: Asencion Noble, MD  HPI: 89 female followed for severe obstructive sleep apnea Care manager for [facility]  TEST/EVENTS : HST 03/25/2021 AHI 72/hour, lowest desaturation 86%, ODI 87/hour 60% snoring  12/25/2021 Follow up : OSA  Patient presents for 37-month follow-up.  Patient has underlying severe obstructive sleep apnea was started on CPAP last year.  Patient says she is doing much better since starting CPAP.  Her daytime sleepiness has decreased significantly.  She is benefiting from her CPAP.  Patient says occasionally she has some vivid dreams and takes off her CPAP in her sleep.  But for the most part wears it every night for at least 6 or more hours.  She is using nasal pillows.  Patient is working on weight loss.  She has joined a gym and is taking water aerobic classes. She is also looking into weight loss surgery. CPAP download shows excellent compliance with 100% usage.  Daily average usage at 6 hours.  Patient is on auto CPAP 5 to 15 cm H2O.  AHI 4.3/hour daily average pressure at 11.3 cm H2O.  No Known Allergies  Immunization History  Administered Date(s) Administered   Influenza Split 09/13/2020   Influenza,inj,Quad PF,6+ Mos 09/16/2018, 09/15/2021   Influenza-Unspecified 10/02/2016   PFIZER(Purple Top)SARS-COV-2 Vaccination 12/28/2019, 01/18/2020, 10/28/2020    Past Medical History:  Diagnosis Date   Abnormal uterine bleeding (AUB) 10/11/2015   ADD (attention deficit disorder)    Anxiety    Arthritis    oa   COVID feb 16th or feb 17th   01-29-21 or 2-17-2022rapid home test cold symptoms x 4 days all symptoms resolved   COVID 04/04/2021   epic, asymptomatic   Elevated TSH since jan 2022   thryoid med adjusted see dr Loanne Drilling 05-02-2021   Fatty liver    no problems in several yrs as of 04-02-21 per pt    GERD (gastroesophageal reflux disease)    Heavy periods    Hypertension    Hypothyroidism    Iron deficiency anemia    Sleep apnea    Thyroid nodule    US done 2013 sees dr Ebony Hail may 20th 2022 for not sure which side no swallowing problems   Vitamin D deficiency     Tobacco History: Social History   Tobacco Use  Smoking Status Never  Smokeless Tobacco Never   Counseling given: Not Answered   Outpatient Medications Prior to Visit  Medication Sig Dispense Refill   Cholecalciferol (VITAMIN D3 PO) Take by mouth.     cyclobenzaprine (FLEXERIL) 10 MG tablet Take 1 tablet by mouth once daily as needed 90 tablet 1   escitalopram (LEXAPRO) 10 MG tablet Take 1 tablet (10 mg total) by mouth every morning. (Patient taking differently: Take 10 mg by mouth at bedtime.) 90 tablet 4   estradiol (VIVELLE-DOT) 0.05 MG/24HR patch Place 1 patch (0.05 mg total) onto the skin 2 (two) times a week. 24 patch 3   fluticasone (FLONASE) 50 MCG/ACT nasal spray Place into both nostrils daily.     Ibuprofen (ADVIL PO) Take by mouth. Takes 2 of 200 mg prn     levothyroxine (SYNTHROID) 150 MCG tablet Take 1 tablet (150 mcg total) by mouth daily before breakfast. 60 tablet 12   Multiple Vitamin (MULTIVITAMIN) capsule Take by mouth daily.  valsartan-hydrochlorothiazide (DIOVAN-HCT) 320-25 MG tablet Take 1 tablet by mouth daily. 90 tablet 4   No facility-administered medications prior to visit.     Review of Systems:   Constitutional:   No  weight loss, night sweats,  Fevers, chills, fatigue, or  lassitude.  HEENT:   No headaches,  Difficulty swallowing,  Tooth/dental problems, or  Sore throat,                No sneezing, itching, ear ache, nasal congestion, post nasal drip,   CV:  No chest pain,  Orthopnea, PND, swelling in lower extremities, anasarca, dizziness, palpitations, syncope.   GI  No heartburn, indigestion, abdominal pain, nausea, vomiting, diarrhea, change in bowel habits, loss of  appetite, bloody stools.   Resp: No shortness of breath with exertion or at rest.  No excess mucus, no productive cough,  No non-productive cough,  No coughing up of blood.  No change in color of mucus.  No wheezing.  No chest wall deformity  Skin: no rash or lesions.  GU: no dysuria, change in color of urine, no urgency or frequency.  No flank pain, no hematuria   MS:  No joint pain or swelling.  No decreased range of motion.  No back pain.    Physical Exam  BP 128/90 (BP Location: Left Arm, Patient Position: Sitting, Cuff Size: Large)    Pulse 78    Temp 98 F (36.7 C) (Oral)    Ht 5' 6.75" (1.695 m)    Wt 250 lb (113.4 kg)    LMP 03/14/2021    SpO2 97%    BMI 39.45 kg/m   GEN: A/Ox3; pleasant , NAD, well nourished    HEENT:  [place]/AT,   NOSE-clear, THROAT-clear, no lesions, no postnasal drip or exudate noted.  Class III MP airway  NECK:  Supple w/ fair ROM; no JVD; normal carotid impulses w/o bruits; no thyromegaly or nodules palpated; no lymphadenopathy.    RESP  Clear  P & A; w/o, wheezes/ rales/ or rhonchi. no accessory muscle use, no dullness to percussion  CARD:  RRR, no m/r/g, no peripheral edema, pulses intact, no cyanosis or clubbing.  GI:   Soft & nt; nml bowel sounds; no organomegaly or masses detected.   Musco: Warm bil, no deformities or joint swelling noted.   Neuro: alert, no focal deficits noted.    Skin: Warm, no lesions or rashes    Lab Results:  CBC     BNP No results found for: BNP  ProBNP No results found for: PROBNP  Imaging: No results found.    No flowsheet data found.  No results found for: NITRICOXIDE      Assessment & Plan:   OSA (obstructive sleep apnea) Excellent control and compliance on CPAP   Plan  Patient Instructions  Continue on CPAP At bedtime   Keep up good work  Do not drive if sleepy  Activity as tolerated.  Work on healthy weight .  Saline nasal spray and gel As needed   Follow up with Dr. Elsworth Soho  or  Guiselle Mian NP in 1 year and As needed      '  Morbid obesity ([place]) Healthy weight loss.      Rexene Edison, NP 12/25/2021

## 2021-12-25 NOTE — Patient Instructions (Signed)
Continue on CPAP At bedtime   Keep up good work  Do not drive if sleepy  Activity as tolerated.  Work on healthy weight .  Saline nasal spray and gel As needed   Follow up with Dr. Elsworth Soho  or Siarah Deleo NP in 1 year and As needed

## 2021-12-25 NOTE — Assessment & Plan Note (Signed)
Excellent control and compliance on CPAP   Plan  Patient Instructions  Continue on CPAP At bedtime   Keep up good work  Do not drive if sleepy  Activity as tolerated.  Work on healthy weight .  Saline nasal spray and gel As needed   Follow up with Dr. Elsworth Soho  or Diksha Tagliaferro NP in 1 year and As needed      '

## 2021-12-25 NOTE — Assessment & Plan Note (Signed)
Healthy weight loss 

## 2021-12-26 ENCOUNTER — Other Ambulatory Visit (HOSPITAL_BASED_OUTPATIENT_CLINIC_OR_DEPARTMENT_OTHER): Payer: Self-pay

## 2021-12-26 ENCOUNTER — Ambulatory Visit
Admission: RE | Admit: 2021-12-26 | Discharge: 2021-12-26 | Disposition: A | Discharge status: Home / Self Care | Payer: No Typology Code available for payment source | Source: Ambulatory Visit | Attending: Emergency Medicine | Admitting: Emergency Medicine

## 2021-12-26 VITALS — BP 157/105 | HR 75 | Temp 98.0°F | Resp 16

## 2021-12-26 DIAGNOSIS — J358 Other chronic diseases of tonsils and adenoids: Secondary | ICD-10-CM | POA: Diagnosis not present

## 2021-12-26 DIAGNOSIS — J351 Hypertrophy of tonsils: Secondary | ICD-10-CM | POA: Diagnosis present

## 2021-12-26 DIAGNOSIS — B9689 Other specified bacterial agents as the cause of diseases classified elsewhere: Secondary | ICD-10-CM | POA: Insufficient documentation

## 2021-12-26 DIAGNOSIS — R52 Pain, unspecified: Secondary | ICD-10-CM | POA: Insufficient documentation

## 2021-12-26 DIAGNOSIS — J038 Acute tonsillitis due to other specified organisms: Secondary | ICD-10-CM | POA: Diagnosis not present

## 2021-12-26 DIAGNOSIS — R509 Fever, unspecified: Secondary | ICD-10-CM | POA: Insufficient documentation

## 2021-12-26 DIAGNOSIS — R5383 Other fatigue: Secondary | ICD-10-CM | POA: Insufficient documentation

## 2021-12-26 LAB — POCT RAPID STREP A (OFFICE): Rapid Strep A Screen: NEGATIVE

## 2021-12-26 MED ORDER — CEFDINIR 300 MG PO CAPS
300.0000 mg | ORAL_CAPSULE | Freq: Two times a day (BID) | ORAL | 0 refills | Status: AC
  Filled 2021-12-26: qty 20, 10d supply, fill #0

## 2021-12-26 MED ORDER — LIDOCAINE VISCOUS HCL 2 % MT SOLN
15.0000 mL | OROMUCOSAL | 0 refills | Status: DC | PRN
  Filled 2021-12-26: qty 300, 3d supply, fill #0

## 2021-12-26 MED ORDER — IBUPROFEN 400 MG PO TABS
400.0000 mg | ORAL_TABLET | Freq: Three times a day (TID) | ORAL | 0 refills | Status: DC | PRN
  Filled 2021-12-26: qty 30, 10d supply, fill #0

## 2021-12-26 NOTE — ED Triage Notes (Signed)
Patient presents to Urgent Care with complaints of headache x 1 week, sore throat 3 days ago. Treating symptoms with advil.   Denies fever.

## 2021-12-26 NOTE — Discharge Instructions (Signed)
Based on my physical exam today and the history that you provided, I believe that you have bacterial pharyngitis.  Bacterial pharyngitis is most commonly caused by bacteria called group A Streptococcus but there are many other culprits.   The rapid strep test that we performed in the office only catches 40% of known cases of group A streptococcal pharyngitis.  Additionally, and unfortunately, the throat culture that we perform here in the urgent care only evaluates for group A strep and not any of the other bacteria  that are known to cause bacterial pharyngitis.      Because you have significant swelling of both tonsils, significant redness of both tonsils and white patches on both tonsils, I have prescribed Cefdinir 300 mg twice daily for 10 days to treat you for bacterial pharyngitis.  Cefdinir covers group A strep along with other known causative organisms.  Please take this antibiotic as prescribed and do not skip any doses.  Once you have been on antibiotics for a full 24 hours, you are no longer considered contagious.       If you receive a phone call telling you that your throat culture is negative, I still strongly recommend that you complete your entire prescription of antibiotics regardless of the result of your throat culture, particularly if you begin to feel better within the first 48 hours of therapy.  The throat culture we perform here at urgent care only tests for group A strep and not any of the other bacteria that can also cause bacterial pharyngitis.  Please follow-up with your primary care provider in the next week to ten days for repeat evaluation if you have not had complete resolution of your symptoms.      You were tested for both COVID and influenza today because here in the urgent care setting, we do not have an available option for an individual influenza test.  I apologize for the redundancy if you have already taken a COVID test at home.  The result of your viral testing will be  posted to your MyChart once it is complete, this typically takes 24 to 48 hours.  If there is a positive result, you will be contacted by phone with further recommendations, if any.    Conservative care is also recommended at this time.  This includes rest, pushing clear fluids and activity as tolerated.  Warm beverages such as teas and broths versus cold beverages/popsicles and frozen sherbet/sorbet are your choice, both warm and cold are beneficial.  You may also notice that your appetite is reduced; this is okay as long as you are drinking plenty of clear fluids.    Based on my physical exam findings and the history you have provided  today, I do not recommend antibiotics at this time.  I do not believe the risks and side effects of antibiotics would outweigh any minimal benefit that they might provide.       Please see the list below for recommended medications, dosages and frequencies to provide relief of your current symptoms:     Cefdinir (Omnicef), take 1 capsule twice daily for 10 days.  Ibuprofen  (Advil, Motrin): This is a good anti-inflammatory medication which addresses aches, pains and inflammation of the upper airways that causes sinus and nasal congestion as well as in the lower airways which makes your cough feel tight and sometimes burn.  I recommend that you take between 400 to 600 mg every 6-8 hours as needed, I have provided you with  a prescription for 400 mg.      Lidocaine (Xylocaine): This is a numbing medication that can be switched for 15 seconds and either spit out or swallowed.  You can use this every 3 hours while awake to relieve pain in your mouth and throat.  I have sent a prescription for this medication to your pharmacy.   Please remain home from work, school, public places until you have been fever free for 24 hours without the use of antifever medications such as Tylenol or ibuprofen.    Please follow-up within the next 3 to 5 days either with your primary care  provider or urgent care if your symptoms do not resolve.  If you do not have a primary care provider, we will assist you in finding one.

## 2021-12-26 NOTE — ED Provider Notes (Signed)
[facility]    CSN: [id number] Arrival date & time: 12/26/21  7867    HISTORY   Chief Complaint  Patient presents with   Sore Throat   Headache   Fatigue   HPI [name] is a 51 y.o. female. Patient complains of a 1 week history of headache, 3 days ago began to have a very sore throat, predominantly on the right but not so much on the left.  Patient denies ear pain, subjective fever.  On physical exam today, patient is much warmer than 98 degrees.  Patient states she is been taking ibuprofen with some relief.  Patient reports working for [employer], states she needs to be tested for COVID and flu.  Patient denies known sick contacts, states she works from home.  The history is provided by the patient.  Past Medical History:  Diagnosis Date   Abnormal uterine bleeding (AUB) 10/11/2015   ADD (attention deficit disorder)    Anxiety    Arthritis    oa   COVID feb 16th or feb 17th   01-29-21 or 2-17-2022rapid home test cold symptoms x 4 days all symptoms resolved   COVID 04/04/2021   epic, asymptomatic   Elevated TSH since jan 2022   thryoid med adjusted see dr Loanne Drilling 05-02-2021   Fatty liver    no problems in several yrs as of 04-02-21 per pt   GERD (gastroesophageal reflux disease)    Heavy periods    Hypertension    Hypothyroidism    Iron deficiency anemia    Sleep apnea    Thyroid nodule    US done 2013 sees dr Ebony Hail may 20th 2022 for not sure which side no swallowing problems   Vitamin D deficiency    Patient Active Problem List   Diagnosis Date Noted   Morbid obesity ([place]) 06/23/2021   Postoperative state 06/10/2021   Preop cardiovascular exam 05/27/2021   NSVT (nonsustained ventricular tachycardia) 05/27/2021   OSA (obstructive sleep apnea) 04/18/2021   Multinodular goiter 04/11/2021   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/27/2018   Unilateral primary osteoarthritis, left knee 11/01/2018   Primary osteoarthritis of left knee 11/15/2017    Dense breast tissue on mammogram 10/21/2016   HLD (hyperlipidemia) 10/21/2016   LVH (left ventricular hypertrophy) 10/21/2016   Mitral regurgitation 10/21/2016   Abnormal uterine bleeding (AUB) 10/11/2015   Hypertension 10/01/2015   Menorrhagia with regular cycle 06/26/2014   ADD (attention deficit disorder) 02/18/2012   Hypothyroidism 12/30/2011   Past Surgical History:  Procedure Laterality Date   CYSTOSCOPY  06/10/2021   Procedure: CYSTOSCOPY;  Surgeon: Princess Bruins, MD;  Location: [place];  Service: Gynecology;;   JOINT REPLACEMENT     ROBOTIC ASSISTED TOTAL HYSTERECTOMY WITH BILATERAL SALPINGO OOPHERECTOMY Bilateral 06/10/2021   Procedure: XI ROBOTIC ASSISTED TOTAL LAPAROSCOPIC HYSTERECTOMY WITH BILATERAL SALPINGECTOMY, REPAIR OF LEFT SMALL LABIA MINORA TEAR;  Surgeon: Princess Bruins, MD;  Location: [place];  Service: Gynecology;  Laterality: Bilateral;   tooth implant  2016   TOTAL KNEE ARTHROPLASTY Left 12/27/2018   Procedure: LEFT TOTAL KNEE ARTHROPLASTY;  Surgeon: Mcarthur Rossetti, MD;  Location: [place];  Service: Orthopedics;  Laterality: Left;   TUBAL LIGATION  ~2000   WISDOM TOOTH EXTRACTION  age 36   OB History     Gravida  3   Para  2   Term  2   Preterm      AB  1   Living  2      SAB      IAB  1   Ectopic      Multiple      Live Births             Home Medications    Prior to Admission medications   Medication Sig Start Date End Date Taking? Authorizing Provider  Cholecalciferol (VITAMIN D3 PO) Take by mouth.    [provider]  cyclobenzaprine (FLEXERIL) 10 MG tablet Take 1 tablet by mouth once daily as needed 09/03/21     escitalopram (LEXAPRO) 10 MG tablet Take 1 tablet (10 mg total) by mouth every morning. Patient taking differently: Take 10 mg by mouth at bedtime. 06/02/21     estradiol (VIVELLE-DOT) 0.05 MG/24HR patch Place 1 patch (0.05 mg total) onto the skin 2 (two) times  a week. 08/28/21   [name], NP  fluticasone (FLONASE) 50 MCG/ACT nasal spray Place into both nostrils daily.    [provider]  Ibuprofen (ADVIL PO) Take by mouth. Takes 2 of 200 mg prn    [provider]  levothyroxine (SYNTHROID) 150 MCG tablet Take 1 tablet (150 mcg total) by mouth daily before breakfast. 12/24/21     Multiple Vitamin (MULTIVITAMIN) capsule Take by mouth daily.    [provider]  valsartan-hydrochlorothiazide (DIOVAN-HCT) 320-25 MG tablet Take 1 tablet by mouth daily. 03/20/21      Family History Family History  Problem Relation Age of Onset   Other Son        hx/o guillian barre   Hearing loss Father    Heart disease Maternal Grandmother    Heart disease Maternal Grandfather    Stroke Paternal Grandmother    Stroke Paternal Grandfather    Cancer Neg Hx    Diabetes Neg Hx    Breast cancer Neg Hx    Thyroid disease Neg Hx    Social History Social History   Tobacco Use   Smoking status: Never   Smokeless tobacco: Never  Vaping Use   Vaping Use: Never used  Substance Use Topics   Alcohol use: Yes    Alcohol/week: 2.0 standard drinks    Types: 2 Glasses of wine per week    Comment: q month   Drug use: No   Allergies   Patient has no known allergies.  Review of Systems Review of Systems Pertinent findings noted in history of present illness.   Physical Exam Triage Vital Signs ED Triage Vitals  Enc Vitals Group     BP 10/10/21 0827 (!) 147/82     Pulse Rate 10/10/21 0827 72     Resp 10/10/21 0827 18     Temp 10/10/21 0827 98.3 F (36.8 C)     Temp Source 10/10/21 0827 Oral     SpO2 10/10/21 0827 98 %     Weight --      Height --      Head Circumference --      Peak Flow --      Pain Score 10/10/21 0826 5     Pain Loc --      Pain Edu? --      Excl. in [place]? --   No data found.  Updated Vital Signs BP (!) 157/105 (BP Location: Left Arm)    Pulse 75    Temp 98 F (36.7 C) (Oral)    Resp 16    LMP  03/14/2021    SpO2 95%   Physical Exam  Constitutional:      General: She is not in acute distress.    Appearance: She is well-developed. She is ill-appearing. She is not toxic-appearing.  HENT:     Head: Normocephalic and atraumatic.     Salivary Glands: Right salivary gland is diffusely enlarged and tender. Left salivary gland is diffusely enlarged and tender.     Right Ear: Hearing and external ear normal.     Left Ear: Hearing and external ear normal.     Ears:     Comments: Right EAC with mild erythema, left EAC normal, bilateral TMs are normal    Nose: No mucosal edema, congestion or rhinorrhea.     Right Turbinates: Not enlarged, swollen or pale.     Left Turbinates: Not enlarged, swollen or pale.     Right Sinus: No maxillary sinus tenderness or frontal sinus tenderness.     Left Sinus: No maxillary sinus tenderness or frontal sinus tenderness.     Mouth/Throat:     Lips: Pink. No lesions.     Mouth: Mucous membranes are moist. No oral lesions or angioedema.     Dentition: No gingival swelling.     Tongue: No lesions.     Palate: No mass.     Pharynx: Uvula midline. Pharyngeal swelling, oropharyngeal exudate and posterior oropharyngeal erythema present. No uvula swelling.     Tonsils: Tonsillar exudate present. 2+ on the right. 1+ on the left.     Comments: Both tonsils diffusely erythematous, right tonsil is larger than the left. Eyes:     Extraocular Movements: Extraocular movements intact.     Conjunctiva/sclera: Conjunctivae normal.     Pupils: Pupils are equal, round, and reactive to light.  Neck:     Thyroid: No thyroid mass, thyromegaly or thyroid tenderness.     Trachea: Tracheal tenderness present. No abnormal tracheal secretions or tracheal deviation.     Comments: Voice is muffled Cardiovascular:     Rate and Rhythm: Normal rate and regular rhythm.     Pulses: Normal pulses.     Heart sounds: Normal heart sounds, S1 normal and S2 normal. No murmur heard.   No  friction rub. No gallop.  Pulmonary:     Effort: Pulmonary effort is normal. No accessory muscle usage, prolonged expiration, respiratory distress or retractions.     Breath sounds: No stridor, decreased air movement or transmitted upper airway sounds. No decreased breath sounds, wheezing, rhonchi or rales.  Abdominal:     General: Bowel sounds are normal.     Palpations: Abdomen is soft.     Tenderness: There is generalized abdominal tenderness. There is no right CVA tenderness, left CVA tenderness or rebound. Negative signs include Murphy's sign.     Hernia: No hernia is present.  Musculoskeletal:        General: No tenderness. Normal range of motion.     Cervical back: Full passive range of motion without pain, normal range of motion and neck supple.     Right lower leg: No edema.     Left lower leg: No edema.  Lymphadenopathy:     Cervical: Cervical adenopathy present.     Right cervical: Superficial cervical adenopathy present.     Left cervical: Superficial cervical adenopathy present.  Skin:    General: Skin is warm and dry.     Findings: No erythema, lesion or rash.  Neurological:     General: No focal deficit present.     Mental Status: She is alert and  oriented to person, place, and time. Mental status is at baseline.  Psychiatric:        Mood and Affect: Mood normal.        Behavior: Behavior normal.        Thought Content: Thought content normal.        Judgment: Judgment normal.    Visual Acuity Right Eye Distance:   Left Eye Distance:   Bilateral Distance:    Right Eye Near:   Left Eye Near:    Bilateral Near:     UC Couse / Diagnostics / Procedures:    EKG  Radiology No results found.  Procedures Procedures (including critical care time)  UC Diagnoses / Final Clinical Impressions(s)   I have reviewed the triage vital signs and the nursing notes.  Pertinent labs & imaging results that were available during my care of the patient were reviewed by me  and considered in my medical decision making (see chart for details).   Final diagnoses:  Enlarged tonsils  Acute bacterial tonsillitis  Fever, unspecified  Body aches  Other fatigue  Tonsillar exudate   Rapid test was negative, throat culture will be performed per protocol.  We will start patient empirically on cefdinir for presumed bacterial tonsillitis.  COVID/flu testing performed for the benefit of her job.  Conservative care recommended, supportive medications prescribed.  Return precautions advised.  ED Prescriptions     Medication Sig Dispense Auth. Provider   cefdinir (OMNICEF) 300 MG capsule Take 1 capsule (300 mg total) by mouth 2 (two) times daily for 10 days. 20 capsule Lynden Oxford Scales, PA-C   lidocaine (XYLOCAINE) 2 % solution Use as directed 15 mLs in the mouth or throat every 3 (three) hours as needed for mouth pain (Sore throat). 300 mL Lynden Oxford Scales, PA-C   ibuprofen (ADVIL) 400 MG tablet Take 1 tablet (400 mg total) by mouth every 8 (eight) hours as needed for up to 30 doses. 30 tablet Lynden Oxford Scales, PA-C      PDMP not reviewed this encounter.  Pending results:  Labs Reviewed  CULTURE, GROUP A STREP ([place])  COVID-19, FLU A+B NAA  POCT RAPID STREP A (OFFICE)    Medications Ordered in UC: Medications - No data to display  Disposition Upon Discharge:  Condition: stable for discharge home Home: take medications as prescribed; routine discharge instructions as discussed; follow up as advised.  Patient presented with an acute illness with associated systemic symptoms and significant discomfort requiring urgent management. In my opinion, this is a condition that a prudent lay person (someone who possesses an average knowledge of health and medicine) may potentially expect to result in complications if not addressed urgently such as respiratory distress, impairment of bodily function or dysfunction of bodily organs.   Routine symptom specific,  illness specific and/or disease specific instructions were discussed with the patient and/or caregiver at length.   As such, the patient has been evaluated and assessed, work-up was performed and treatment was provided in alignment with urgent care protocols and evidence based medicine.  Patient/parent/caregiver has been advised that the patient may require follow up for further testing and treatment if the symptoms continue in spite of treatment, as clinically indicated and appropriate.  If the patient was tested for COVID-19, Influenza and/or RSV, then the patient/parent/guardian was advised to isolate at home pending the results of his/her diagnostic coronavirus test and potentially longer if theyre positive. I have also advised pt that if his/her COVID-19 test returns positive,  it's recommended to self-isolate for at least 10 days after symptoms first appeared AND until fever-free for 24 hours without fever reducer AND other symptoms have improved or resolved. Discussed self-isolation recommendations as well as instructions for household member/close contacts as per the [facility] and [place] [facility], and also gave patient the [place] packet with this information.  Patient/parent/caregiver has been advised to return to the [facility] or PCP in 3-5 days if no better; to PCP or the Emergency Department if new signs and symptoms develop, or if the current signs or symptoms continue to change or worsen for further workup, evaluation and treatment as clinically indicated and appropriate  The patient will follow up with their current PCP if and as advised. If the patient does not currently have a PCP we will assist them in obtaining one.   The patient may need specialty follow up if the symptoms continue, in spite of conservative treatment and management, for further workup, evaluation, consultation and treatment as clinically indicated and appropriate.  Patient/parent/caregiver verbalized understanding and agreement of plan as  discussed.  All questions were addressed during visit.  Please see discharge instructions below for further details of plan.  Discharge Instructions:   Discharge Instructions       Based on my physical exam today and the history that you provided, I believe that you have bacterial pharyngitis.  Bacterial pharyngitis is most commonly caused by bacteria called group A Streptococcus but there are many other culprits.   The rapid strep test that we performed in the office only catches 40% of known cases of group A streptococcal pharyngitis.  Additionally, and unfortunately, the throat culture that we perform here in the urgent care only evaluates for group A strep and not any of the other bacteria  that are known to cause bacterial pharyngitis.      Because you have significant swelling of both tonsils, significant redness of both tonsils and white patches on both tonsils, I have prescribed Cefdinir 300 mg twice daily for 10 days to treat you for bacterial pharyngitis.  Cefdinir covers group A strep along with other known causative organisms.  Please take this antibiotic as prescribed and do not skip any doses.  Once you have been on antibiotics for a full 24 hours, you are no longer considered contagious.       If you receive a phone call telling you that your throat culture is negative, I still strongly recommend that you complete your entire prescription of antibiotics regardless of the result of your throat culture, particularly if you begin to feel better within the first 48 hours of therapy.  The throat culture we perform here at urgent care only tests for group A strep and not any of the other bacteria that can also cause bacterial pharyngitis.  Please follow-up with your primary care provider in the next week to ten days for repeat evaluation if you have not had complete resolution of your symptoms.      You were tested for both COVID and influenza today because here in the urgent care setting, we  do not have an available option for an individual influenza test.  I apologize for the redundancy if you have already taken a COVID test at home.  The result of your viral testing will be posted to your MyChart once it is complete, this typically takes 24 to 48 hours.  If there is a positive result, you will be contacted by phone with further recommendations, if any.  Conservative care is also recommended at this time.  This includes rest, pushing clear fluids and activity as tolerated.  Warm beverages such as teas and broths versus cold beverages/popsicles and frozen sherbet/sorbet are your choice, both warm and cold are beneficial.  You may also notice that your appetite is reduced; this is okay as long as you are drinking plenty of clear fluids.    Based on my physical exam findings and the history you have provided  today, I do not recommend antibiotics at this time.  I do not believe the risks and side effects of antibiotics would outweigh any minimal benefit that they might provide.       Please see the list below for recommended medications, dosages and frequencies to provide relief of your current symptoms:     Cefdinir (Omnicef), take 1 capsule twice daily for 10 days.  Ibuprofen  (Advil, Motrin): This is a good anti-inflammatory medication which addresses aches, pains and inflammation of the upper airways that causes sinus and nasal congestion as well as in the lower airways which makes your cough feel tight and sometimes burn.  I recommend that you take between 400 to 600 mg every 6-8 hours as needed, I have provided you with a prescription for 400 mg.      Lidocaine (Xylocaine): This is a numbing medication that can be switched for 15 seconds and either spit out or swallowed.  You can use this every 3 hours while awake to relieve pain in your mouth and throat.  I have sent a prescription for this medication to your pharmacy.   Please remain home from work, school, public places until you  have been fever free for 24 hours without the use of antifever medications such as Tylenol or ibuprofen.    Please follow-up within the next 3 to 5 days either with your primary care provider or urgent care if your symptoms do not resolve.  If you do not have a primary care provider, we will assist you in finding one.         This office note has been dictated using Museum/gallery curator.  Unfortunately, and despite my best efforts, this method of dictation can sometimes lead to occasional typographical or grammatical errors.  I apologize in advance if this occurs.     Lynden Oxford Scales, PA-C 12/26/21 1259

## 2021-12-28 LAB — COVID-19, FLU A+B NAA
Influenza A, NAA: NOT DETECTED
Influenza B, NAA: NOT DETECTED
SARS-CoV-2, NAA: NOT DETECTED

## 2021-12-28 LAB — CULTURE, GROUP A STREP (THRC)

## 2022-01-13 ENCOUNTER — Other Ambulatory Visit (HOSPITAL_BASED_OUTPATIENT_CLINIC_OR_DEPARTMENT_OTHER): Payer: Self-pay

## 2022-01-13 MED ORDER — DIPHENOXYLATE-ATROPINE 2.5-0.025 MG PO TABS
ORAL_TABLET | ORAL | 0 refills | Status: DC
  Filled 2022-01-13: qty 15, 3d supply, fill #0

## 2022-01-14 ENCOUNTER — Other Ambulatory Visit (HOSPITAL_COMMUNITY): Payer: Self-pay | Admitting: General Surgery

## 2022-01-14 DIAGNOSIS — D734 Cyst of spleen: Secondary | ICD-10-CM

## 2022-01-19 ENCOUNTER — Encounter: Payer: Self-pay | Admitting: Nurse Practitioner

## 2022-01-19 ENCOUNTER — Encounter (HOSPITAL_BASED_OUTPATIENT_CLINIC_OR_DEPARTMENT_OTHER): Payer: Self-pay | Admitting: Pharmacist

## 2022-01-19 ENCOUNTER — Ambulatory Visit (INDEPENDENT_AMBULATORY_CARE_PROVIDER_SITE_OTHER): Payer: No Typology Code available for payment source | Admitting: Nurse Practitioner

## 2022-01-19 ENCOUNTER — Other Ambulatory Visit (HOSPITAL_BASED_OUTPATIENT_CLINIC_OR_DEPARTMENT_OTHER): Payer: Self-pay

## 2022-01-19 ENCOUNTER — Other Ambulatory Visit: Payer: Self-pay

## 2022-01-19 VITALS — BP 124/82 | Ht 66.5 in | Wt 241.0 lb

## 2022-01-19 DIAGNOSIS — N951 Menopausal and female climacteric states: Secondary | ICD-10-CM | POA: Diagnosis not present

## 2022-01-19 DIAGNOSIS — Z01419 Encounter for gynecological examination (general) (routine) without abnormal findings: Secondary | ICD-10-CM

## 2022-01-19 DIAGNOSIS — Z01812 Encounter for preprocedural laboratory examination: Secondary | ICD-10-CM

## 2022-01-19 DIAGNOSIS — Z1211 Encounter for screening for malignant neoplasm of colon: Secondary | ICD-10-CM | POA: Diagnosis not present

## 2022-01-19 DIAGNOSIS — Z79890 Hormone replacement therapy (postmenopausal): Secondary | ICD-10-CM | POA: Diagnosis not present

## 2022-01-19 MED ORDER — ESTRADIOL 0.075 MG/24HR TD PTTW
1.0000 | MEDICATED_PATCH | TRANSDERMAL | 3 refills | Status: DC
  Filled 2022-01-19: qty 24, 84d supply, fill #0
  Filled 2022-06-22: qty 24, 84d supply, fill #1
  Filled 2022-08-25 – 2022-08-28 (×2): qty 24, 84d supply, fill #2
  Filled 2023-01-04: qty 24, 84d supply, fill #3

## 2022-01-19 NOTE — Progress Notes (Signed)
[name] 1971/06/29 [id number]   History:  51 y.o. [id number] presents for annual exam. S/P June 2022 robotic TLH with bilateral salpingectomies for menorrhagia s/t fibroids. Started on ERT September 2022 for hot flashes, night sweats, and mood changes. She has only noticed a small improvement. Normal pap and mammogram history. HTN managed by PCP, hypothyroidism managed by endocrinology, being followed by general surgery for splenic cyst (CT abdomen scheduled 2/13). In process for bariatric surgery and would like screening lab work done today.   Gynecologic History Patient's last menstrual period was 03/14/2021.   Contraception/Family planning: status post hysterectomy Sexually active: Yes  Health Maintenance Last Pap: 12/27/2019. Results were: Normal Last mammogram: 11/04/2021. Results were: Normal Last colonoscopy: Never Last Dexa: Not indicated  Past medical history, past surgical history, family history and social history were all reviewed and documented in the EPIC chart. Married. 2 children. 3 grandchildren ages 28 months, 1 year, and almost 3 years. Works remote for [employer].   ROS:  A ROS was performed and pertinent positives and negatives are included.  Exam:  Vitals:   01/19/22 0821  BP: 124/82  Weight: 241 lb (109.3 kg)  Height: 5' 6.5" (1.689 m)   Body mass index is 38.32 kg/m.  General appearance:  Normal Thyroid:  Symmetrical, normal in size, without palpable masses or nodularity. Respiratory  Auscultation:  Clear without wheezing or rhonchi Cardiovascular  Auscultation:  Regular rate, without rubs, murmurs or gallops  Edema/varicosities:  Not grossly evident Abdominal  Soft,nontender, without masses, guarding or rebound.  Liver/spleen:  No organomegaly noted  Hernia:  None appreciated  Skin  Inspection:  Grossly normal Breasts: Examined lying and sitting.   Right: Without masses, retractions, nipple discharge or axillary adenopathy.   Left: Without  masses, retractions, nipple discharge or axillary adenopathy. Genitourinary   Inguinal/mons:  Normal without inguinal adenopathy  External genitalia:  Normal appearing vulva with no masses, tenderness, or lesions  BUS/Urethra/Skene's glands:  Normal  Vagina:  Normal appearing with normal color and discharge, no lesions  Cervix:  Absent  Uterus:  Absent  Adnexa/parametria:     Rt: Normal in size, without masses or tenderness.   Lt: Normal in size, without masses or tenderness.  Anus and perineum: Normal  Digital rectal exam: Normal sphincter tone without palpated masses or tenderness  Patient informed chaperone available to be present for breast and pelvic exam. Patient has requested no chaperone to be present. Patient has been advised what will be completed during breast and pelvic exam.   Assessment/Plan:  51 y.o. [id number] for annual exam.   Well female exam with routine gynecological exam - Plan: CBC with Differential/Platelet, Comprehensive metabolic panel, TSH, Lipid panel. Education provided on SBEs, importance of preventative screenings, current guidelines, high calcium diet, regular exercise, and multivitamin daily.   Hormone replacement therapy - Plan: estradiol (VIVELLE-DOT) 0.075 MG/24HR twice weekly. Started on ERT September 2022 for hot flashes, night sweats, and mood changes with only a small improvement in symptoms. Will increase dose. She is aware of risk of blood clots, heart attack, stroke, and breast cancer and benefits of heart health, bone health, and symptom management.   Screening for colon cancer - Plan: Cologuard. No family history of personal risk factors. She is candidate for Cologuard.   Pre-procedure lab exam - Plan: CBC with Differential/Platelet, Comprehensive metabolic panel, TSH, VITAMIN D 25 Hydroxy (Vit-D Deficiency, Fractures), Hemoglobin A1c, Lipid panel, Iron, TIBC and Ferritin Panel. In process of scheduling bariatric surgery. Requested pre-procedure labs  today.   Screening for cervical cancer - Normal Pap history. No longer screening per guidelines.   Screening for breast cancer - Normal mammogram history.  Continue annual screenings.  Normal breast exam today.   Return in 1 year for annual.     [name] DNP, 8:56 AM 01/19/2022

## 2022-01-20 ENCOUNTER — Other Ambulatory Visit (HOSPITAL_BASED_OUTPATIENT_CLINIC_OR_DEPARTMENT_OTHER): Payer: Self-pay

## 2022-01-26 ENCOUNTER — Ambulatory Visit (HOSPITAL_COMMUNITY)
Admission: RE | Admit: 2022-01-26 | Discharge: 2022-01-26 | Disposition: A | Discharge status: Home / Self Care | Payer: No Typology Code available for payment source | Source: Ambulatory Visit | Attending: General Surgery | Admitting: General Surgery

## 2022-01-26 ENCOUNTER — Other Ambulatory Visit: Payer: Self-pay

## 2022-02-05 ENCOUNTER — Encounter: Payer: No Typology Code available for payment source | Attending: General Surgery | Admitting: Skilled Nursing Facility1

## 2022-02-05 ENCOUNTER — Encounter: Payer: Self-pay | Admitting: Skilled Nursing Facility1

## 2022-02-05 ENCOUNTER — Other Ambulatory Visit: Payer: Self-pay

## 2022-02-05 DIAGNOSIS — Z713 Dietary counseling and surveillance: Secondary | ICD-10-CM | POA: Insufficient documentation

## 2022-02-05 DIAGNOSIS — Z6838 Body mass index (BMI) 38.0-38.9, adult: Secondary | ICD-10-CM | POA: Diagnosis not present

## 2022-02-05 NOTE — Progress Notes (Signed)
Nutrition Assessment for Bariatric Surgery Medical Nutrition Therapy Appt Start Time: 10:20    End Time: 11:50  Patient was seen on 02/05/2022 for Pre-Operative Nutrition Assessment. Letter of approval faxed to [facility] bariatric surgery program coordinator on 02/05/2022.   Referral stated Supervised Weight Loss (SWL) visits needed: 0  Pt completed visits.   Pt has cleared nutrition requirements.   Planned surgery: sleeve gastrectomy or RYGB Pt expectation of surgery: to lose weight Pt expectation of dietitian: Education and Support     NUTRITION ASSESSMENT   Anthropometrics  Start weight at [facility]: 243.8 lbs (date: 02/05/2022)  Height: 66.5 in BMI: 38.76 kg/m2     Clinical  Medical hx: anxiety, HTN, GERD Medications: see list  Labs: cholesterol 217, HDL 32, Triglycerides 275, LDL 143, A1C 6.5, Creatinine 1.22 Notable signs/symptoms: N/A Any previous deficiencies? Yes: iron, vitamin D  Micronutrient Nutrition Focused Physical Exam: Hair: No issues observed Eyes: No issues observed Mouth: No issues observed Neck: No issues observed Nails: No issues observed Skin: No issues observed  Lifestyle & Dietary Hx  Pt states she is unsure which surgery she wants.   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  Pt states she just really wants to be strong and healthy.  Pt states she is an emotional eater.  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   Pt is an emotional at eater but seems to be enlightened on the subject  24-Hr Dietary Recall First Meal: skipped or egg and toast + 2 mandrin ornages Snack:  Second Meal: peach cobbler and peanut butter and jelly sandwich  Snack: peach cobbler Third Meal: 2 manderin ornges + ice cream Snack: hamburger + mac n cheese + onion rings  Beverages: coffee, water, diet green tea, juice   Estimated Energy Needs Calories: 1600   NUTRITION DIAGNOSIS  Overweight/obesity ([place]-3.3) related  to past poor dietary habits and physical inactivity as evidenced by patient w/ planned sleeve or RYGB surgery following dietary guidelines for continued weight loss.    NUTRITION INTERVENTION  Nutrition counseling (C-1) and education (E-2) to facilitate bariatric surgery goals.  Educated pt on micronutrient deficiencies post surgery and strategies to mitigate that risk   Pre-Op Goals Reviewed with the Patient Track food and beverage intake (pen and paper, MyFitness Pal, Baritastic app, etc.) Make healthy food choices while monitoring portion sizes Consume 3 meals per day or try to eat every 3-5 hours Avoid concentrated sugars and fried foods Keep sugar & fat in the single digits per serving on food labels Practice CHEWING your food (aim for applesauce consistency) Practice not drinking 15 minutes before, during, and 30 minutes after each meal and snack Avoid all carbonated beverages (ex: soda, sparkling beverages)  Limit caffeinated beverages (ex: coffee, tea, energy drinks) Avoid all sugar-sweetened beverages (ex: regular soda, sports drinks)  Avoid alcohol  Aim for 64-100 ounces of FLUID daily (with at least half of fluid intake being plain water)  Aim for at least 60-80 grams of PROTEIN daily Look for a liquid protein source that contains ?15 g protein and ?5 g carbohydrate (ex: shakes, drinks, shots) Make a list of non-food related activities Physical activity is an important part of a healthy lifestyle so keep it moving! The goal is to reach 150 minutes of exercise per week, including cardiovascular and weight baring activity. Make non starchy vegetables for yourself Work on relationship with food and emotional eating  *Goals that  are bolded indicate the pt would like to start working towards these  Handouts Provided Include  Bariatric Surgery handouts (Nutrition Visits, Pre-Op Goals, Protein Shakes, Vitamins & Minerals) Should I eat sheet Mindful meals sheet  Learning Style &  Readiness for Change Teaching method utilized: Visual & Auditory  Demonstrated degree of understanding via: Teach Back  Readiness Level: contemplative Barriers to learning/adherence to lifestyle change: emotional eating     MONITORING & EVALUATION Dietary intake, weekly physical activity, body weight, and pre-op goals reached at next nutrition visit.    Next Steps  Patient is to follow up at [place] for Pre-Op Class >2 weeks before surgery for further nutrition education.   Pt has completed visits. No further supervised visits required/recomended

## 2022-02-06 ENCOUNTER — Telehealth (INDEPENDENT_AMBULATORY_CARE_PROVIDER_SITE_OTHER): Payer: No Typology Code available for payment source | Admitting: Physician Assistant

## 2022-02-06 ENCOUNTER — Telehealth: Payer: Self-pay | Admitting: Physician Assistant

## 2022-02-06 ENCOUNTER — Other Ambulatory Visit (HOSPITAL_BASED_OUTPATIENT_CLINIC_OR_DEPARTMENT_OTHER): Payer: Self-pay

## 2022-02-06 ENCOUNTER — Encounter: Payer: Self-pay | Admitting: Physician Assistant

## 2022-02-06 ENCOUNTER — Telehealth: Payer: Self-pay | Admitting: *Deleted

## 2022-02-06 VITALS — Ht 66.0 in | Wt 242.0 lb

## 2022-02-06 DIAGNOSIS — Z1212 Encounter for screening for malignant neoplasm of rectum: Secondary | ICD-10-CM

## 2022-02-06 DIAGNOSIS — R933 Abnormal findings on diagnostic imaging of other parts of digestive tract: Secondary | ICD-10-CM

## 2022-02-06 DIAGNOSIS — K219 Gastro-esophageal reflux disease without esophagitis: Secondary | ICD-10-CM | POA: Diagnosis not present

## 2022-02-06 DIAGNOSIS — Z1211 Encounter for screening for malignant neoplasm of colon: Secondary | ICD-10-CM

## 2022-02-06 MED ORDER — NA SULFATE-K SULFATE-MG SULF 17.5-3.13-1.6 GM/177ML PO SOLN
1.0000 | Freq: Once | ORAL | 0 refills | Status: AC
  Filled 2022-02-06: qty 354, 1d supply, fill #0

## 2022-02-06 NOTE — Telephone Encounter (Signed)
-----   Message from Levin Erp, [place] sent at 02/06/2022  9:15 AM EST ----- Regarding: EGD and Colonoscopy Nocholas Damaso,  Please schedule patient for an EGD and colonoscopy in the [place] with Dr. Dr. Hilarie Fredrickson.  She will need to be talked through the instructions and have her bowel prep sent to the pharmacy.  Please let me know when this is completed.  Thanks, JL L

## 2022-02-06 NOTE — Progress Notes (Addendum)
Virtual Visit via Video Note  I connected with [name] on 02/06/22 at  8:30 AM EST by a video enabled telemedicine application and verified that I am speaking with the correct person using two identifiers.  Location: Patient: At home Provider: In clinic   I discussed the limitations of evaluation and management by telemedicine and the availability of in person appointments. The patient expressed understanding and agreed to proceed.   Chief Complaint: Abnormal barium swallow  HPI:    [name] is a 51 year old Caucasian female with a past medical history as listed below including reflux, fatty liver and obesity, who was seen via telehealth this morning for an abnormal barium swallow.    01/19/2022 seen NP with an elevated glucose of 119 and a creatinine of 1.22, otherwise normal, normal CBC and TSH as well as iron panel.    01/26/2022 patient had an upper GI series with KUB as part of preoperative exam for gastric bypass surgery and the swallowed 13 mm barium tablet did not pass beyond the level of the distal esophagus despite a prolonged period of observation and despite the patient drinking additional water and barium contrast, this is thought to reflect the presence of a mild smooth distal esophageal stricture at this site, EGD recommended for further eval.  Otherwise unremarkable upper GI.    Today, the patient explains that she is going through the work-up to have a gastric bypass surgery.  During that she had upper GI series as above and was found to have a stricture.  Tells me she has noticed occasionally that things feel like they passed through a tight spot in her esophagus but denies any overt dysphagia symptoms.  Does tell me that she suffered from reflux mainly at night for years, but got a CPAP machine over the past 6 months and this has completely relieved her symptoms.  She is not taking any medication for reflux.  Denies abdominal pain.    Patient tells me that she has  collected the materials for a Cologuard from her PCP but has not completed this.  Denies family history of colon cancer.    Denies fever, chills, weight loss, blood in her stool, abdominal pain, nausea or vomiting.  Past Medical History:  Diagnosis Date   Abnormal uterine bleeding (AUB) 10/11/2015   ADD (attention deficit disorder)    Anxiety    Arthritis    oa   COVID feb 16th or feb 17th   01-29-21 or 2-17-2022rapid home test cold symptoms x 4 days all symptoms resolved   COVID 04/04/2021   epic, asymptomatic   Elevated TSH since jan 2022   thryoid med adjusted see dr Loanne Drilling 05-02-2021   Fatty liver    no problems in several yrs as of 04-02-21 per pt   GERD (gastroesophageal reflux disease)    Heavy periods    Hypertension    Hypothyroidism    Iron deficiency anemia    Sleep apnea    Thyroid nodule    US done 2013 sees dr Ebony Hail may 20th 2022 for not sure which side no swallowing problems   Vitamin D deficiency     Past Surgical History:  Procedure Laterality Date   CYSTOSCOPY  06/10/2021   Procedure: CYSTOSCOPY;  Surgeon: Princess Bruins, MD;  Location: [place];  Service: Gynecology;;   JOINT REPLACEMENT     ROBOTIC ASSISTED TOTAL HYSTERECTOMY WITH BILATERAL SALPINGO OOPHERECTOMY Bilateral 06/10/2021   Procedure: XI ROBOTIC ASSISTED TOTAL LAPAROSCOPIC HYSTERECTOMY WITH  BILATERAL SALPINGECTOMY, REPAIR OF LEFT SMALL LABIA MINORA TEAR;  Surgeon: Princess Bruins, MD;  Location: [place];  Service: Gynecology;  Laterality: Bilateral;   tooth implant  2016   TOTAL KNEE ARTHROPLASTY Left 12/27/2018   Procedure: LEFT TOTAL KNEE ARTHROPLASTY;  Surgeon: Mcarthur Rossetti, MD;  Location: [place];  Service: Orthopedics;  Laterality: Left;   TUBAL LIGATION  ~2000   WISDOM TOOTH EXTRACTION  age 17    Current Outpatient Medications  Medication Sig Dispense Refill   Cholecalciferol (VITAMIN D3 PO) Take by mouth.     cyclobenzaprine  (FLEXERIL) 10 MG tablet Take 1 tablet by mouth once daily as needed 90 tablet 1   diphenoxylate-atropine (LOMOTIL) 2.5-0.025 MG tablet Take 1 tablet by mouth every 6 hours as needed. (Patient not taking: Reported on 01/19/2022) 15 tablet 0   escitalopram (LEXAPRO) 10 MG tablet Take 1 tablet (10 mg total) by mouth every morning. (Patient taking differently: Take 10 mg by mouth at bedtime.) 90 tablet 4   estradiol (VIVELLE-DOT) 0.075 MG/24HR Place 1 patch onto the skin 2 (two) times a week. 24 patch 3   fluticasone (FLONASE) 50 MCG/ACT nasal spray Place into both nostrils daily.     ibuprofen (ADVIL) 400 MG tablet Take 1 tablet (400 mg total) by mouth every 8 (eight) hours as needed for up to 30 doses. 30 tablet 0   levothyroxine (SYNTHROID) 150 MCG tablet Take 1 tablet (150 mcg total) by mouth daily before breakfast. 60 tablet 12   Multiple Vitamin (MULTIVITAMIN) capsule Take by mouth daily. (Patient not taking: Reported on 01/19/2022)     valsartan-hydrochlorothiazide (DIOVAN-HCT) 320-25 MG tablet Take 1 tablet by mouth daily. 90 tablet 4   No current facility-administered medications for this visit.    Allergies as of 02/06/2022   (No Known Allergies)    Family History  Problem Relation Age of Onset   Other Son        hx/o guillian barre   Hearing loss Father    Heart disease Maternal Grandmother    Heart disease Maternal Grandfather    Stroke Paternal Grandmother    Stroke Paternal Grandfather    Cancer Neg Hx    Diabetes Neg Hx    Breast cancer Neg Hx    Thyroid disease Neg Hx     Social History   Socioeconomic History   Marital status: Married    Spouse name: Legrand Como   Number of children: 2   Years of education: 14   Highest education level: Not on file  Occupational History   Occupation: Paediatric nurse: [employer]    Comment: [employer]  Tobacco Use   Smoking status: Never   Smokeless tobacco: Never  Vaping Use   Vaping Use: Never used   Substance and Sexual Activity   Alcohol use: Yes    Alcohol/week: 1.0 standard drink    Types: 1 Glasses of wine per week    Comment: q month   Drug use: No   Sexual activity: Yes    Birth control/protection: Surgical    Comment: tubal lig.-1st intercourse 51yo-More than 5 partners  Other Topics Concern   Not on file  Social History Narrative   Recently married to [employer]   4 children between them   One son at home   Social Determinants of Health   Financial Resource Strain: Not on file  Food Insecurity: Not on file  Transportation Needs: Not on file  Physical Activity:  Not on file  Stress: Not on file  Social Connections: Not on file  Intimate Partner Violence: Not on file    Review of Systems:    Constitutional: No weight loss, fever or chills Skin: No rash  Cardiovascular: No chest pain Respiratory: No SOB Gastrointestinal: See HPI and otherwise negative Genitourinary: No dysuria Neurological: No headache Musculoskeletal: No new muscle or joint pain Hematologic: No bleeding  Psychiatric: No history of depression or anxiety   Physical Exam:  Vital signs: Ht 5\' 6"  (1.676 m)    Wt 242 lb (109.8 kg)    LMP 03/14/2021    BMI 39.06 kg/m   (limited as patient is seen virtually)  Constitutional:   Pleasant overweight Caucasian female appears to be in NAD, Well developed, Well nourished, alert and cooperative Head:  Normocephalic and atraumatic. Eyes:   No icterus. Conjunctiva pink. Ears:  Normal auditory acuity. Neurologic:  Alert and  oriented x4 Psychiatric: =Demonstrates good judgement and reason without abnormal affect or behaviors.  RELEVANT LABS AND IMAGING: CBC    Component Value Date/Time   WBC 6.8 01/19/2022 0847   RBC 4.70 01/19/2022 0847   HGB 14.1 01/19/2022 0847   HGB 10.4 (L) 07/14/2018 1141   HCT 41.9 01/19/2022 0847   HCT 34.4 07/14/2018 1141   PLT 295 01/19/2022 0847   PLT 335 10/11/2015 1323   MCV 89.1 01/19/2022 0847   MCV 78 (L)  07/14/2018 1141   MCH 30.0 01/19/2022 0847   MCHC 33.7 01/19/2022 0847   RDW 13.4 01/19/2022 0847   RDW 19.0 (H) 07/14/2018 1141   LYMPHSABS 1,564 01/19/2022 0847   LYMPHSABS 2.2 07/14/2018 1141   MONOABS 0.5 05/01/2014 1548   EOSABS 150 01/19/2022 0847   EOSABS 0.2 07/14/2018 1141   BASOSABS 48 01/19/2022 0847   BASOSABS 0.1 07/14/2018 1141    CMP     Component Value Date/Time   NA 143 01/19/2022 0847   NA 141 07/14/2018 1141   K 3.9 01/19/2022 0847   CL 103 01/19/2022 0847   CO2 28 01/19/2022 0847   GLUCOSE 119 (H) 01/19/2022 0847   BUN 18 01/19/2022 0847   BUN 11 07/14/2018 1141   CREATININE 1.22 (H) 01/19/2022 0847   CALCIUM 9.6 01/19/2022 0847   PROT 7.3 01/19/2022 0847   PROT 7.2 07/14/2018 1141   ALBUMIN 4.1 05/10/2021 1144   ALBUMIN 4.4 07/14/2018 1141   AST 112 (H) 01/19/2022 0847   ALT 88 (H) 01/19/2022 0847   ALKPHOS 98 05/10/2021 1144   BILITOT 0.4 01/19/2022 0847   BILITOT <0.2 07/14/2018 1141   GFRNONAA >60 06/06/2021 0928   GFRAA >60 12/28/2018 0127    Assessment: 1.  Abnormal barium swallow: Recently and work-up for bariatric surgery showing a stricture, patient does report a feeling of tightness in this area occasionally, likely related to below 2.  GERD: Was having frequent symptoms at night, but since starting the CPAP this has resolved 3.  Screening for colorectal cancer: Patient is 56 and never had screening for colon cancer, she has a Cologuard at home which she has not completed  Plan: 1.  Patient will be scheduled for an EGD with dilation and colonoscopy in the [facility] with Dr. Dr. Hilarie Fredrickson.  Did discuss risks, benefits, limitations and alternatives and the patient agrees to proceed. Patient is appropriate for endoscopic procedure(s) in the ambulatory ([facility] [place]) setting.  2.  Discussed reflux with the patient.  She is not currently having any symptoms.  Due to this we  will not start her on any medication, but did discuss that pending results of EGD this may  be started after time of procedure.  She verbalized understanding. 3.  Patient to follow in clinic per recommendations after above.  I spent 20 minutes in care of this patient.  Ellouise Newer, PA-C [facility] 02/06/2022, 8:24 AM  Cc: Asencion Noble, MD

## 2022-02-06 NOTE — Patient Instructions (Signed)
[name],  It was a pleasure to speak with you today, as we discussed one of our nurses should be calling you within the next 2 to 3 days to set up your EGD and colonoscopy.  They will talk you through the bowel prep at that time and send it to the pharmacy for you.  Sincerely, Ellouise Newer, PA-C

## 2022-02-06 NOTE — Telephone Encounter (Signed)
Ms. [name], [name] are scheduled for a virtual visit with your provider today.    Just as we do with appointments in the office, we must obtain your consent to participate.  Your consent will be active for this visit and any virtual visit you may have with one of our providers in the next 365 days.    If you have a MyChart account, I can also send a copy of this consent to you electronically.  All virtual visits are billed to your insurance company just like a traditional visit in the office.  As this is a virtual visit, video technology does not allow for your provider to perform a traditional examination.  This may limit your provider's ability to fully assess your condition.  If your provider identifies any concerns that need to be evaluated in person or the need to arrange testing such as labs, EKG, etc, we will make arrangements to do so.    Although advances in technology are sophisticated, we cannot ensure that it will always work on either your end or our end.  If the connection with a video visit is poor, we may have to switch to a telephone visit.  With either a video or telephone visit, we are not always able to ensure that we have a secure connection.   I need to obtain your verbal consent now.   Are you willing to proceed with your visit today?   [name] has provided verbal consent on 02/06/2022 for a virtual visit (video or telephone).   [place], [place] 02/06/2022  8:37 AM

## 2022-02-09 ENCOUNTER — Other Ambulatory Visit: Payer: Self-pay

## 2022-02-09 ENCOUNTER — Ambulatory Visit (HOSPITAL_COMMUNITY)
Admission: RE | Admit: 2022-02-09 | Discharge: 2022-02-09 | Disposition: A | Discharge status: Home / Self Care | Payer: No Typology Code available for payment source | Source: Ambulatory Visit | Attending: General Surgery | Admitting: General Surgery

## 2022-02-09 ENCOUNTER — Encounter (HOSPITAL_COMMUNITY): Payer: Self-pay

## 2022-02-09 DIAGNOSIS — D734 Cyst of spleen: Secondary | ICD-10-CM | POA: Diagnosis not present

## 2022-02-09 MED ORDER — SODIUM CHLORIDE (PF) 0.9 % IJ SOLN
INTRAMUSCULAR | Status: AC
  Filled 2022-02-09: qty 50

## 2022-02-09 MED ORDER — IOHEXOL 300 MG/ML  SOLN
100.0000 mL | Freq: Once | INTRAMUSCULAR | Status: AC | PRN
  Administered 2022-02-09: 100 mL via INTRAVENOUS

## 2022-02-13 ENCOUNTER — Other Ambulatory Visit (HOSPITAL_BASED_OUTPATIENT_CLINIC_OR_DEPARTMENT_OTHER): Payer: Self-pay

## 2022-02-16 NOTE — Progress Notes (Signed)
Addendum: Reviewed and agree with assessment and management plan. Ioane Bhola M, MD  

## 2022-02-19 ENCOUNTER — Ambulatory Visit (AMBULATORY_SURGERY_CENTER): Payer: No Typology Code available for payment source | Admitting: Internal Medicine

## 2022-02-19 ENCOUNTER — Other Ambulatory Visit: Payer: Self-pay

## 2022-02-19 ENCOUNTER — Encounter: Payer: Self-pay | Admitting: Internal Medicine

## 2022-02-19 ENCOUNTER — Encounter: Payer: No Typology Code available for payment source | Admitting: Internal Medicine

## 2022-02-19 VITALS — BP 130/77 | HR 65 | Temp 97.5°F | Resp 15 | Ht 66.0 in | Wt 242.0 lb

## 2022-02-19 DIAGNOSIS — K222 Esophageal obstruction: Secondary | ICD-10-CM

## 2022-02-19 DIAGNOSIS — Z1212 Encounter for screening for malignant neoplasm of rectum: Secondary | ICD-10-CM

## 2022-02-19 DIAGNOSIS — Z1211 Encounter for screening for malignant neoplasm of colon: Secondary | ICD-10-CM

## 2022-02-19 DIAGNOSIS — R933 Abnormal findings on diagnostic imaging of other parts of digestive tract: Secondary | ICD-10-CM

## 2022-02-19 DIAGNOSIS — K2970 Gastritis, unspecified, without bleeding: Secondary | ICD-10-CM

## 2022-02-19 DIAGNOSIS — K2950 Unspecified chronic gastritis without bleeding: Secondary | ICD-10-CM | POA: Diagnosis not present

## 2022-02-19 MED ORDER — SODIUM CHLORIDE 0.9 % IV SOLN: 500.0000 mL | Freq: Once | INTRAVENOUS | Status: DC

## 2022-02-19 NOTE — Progress Notes (Signed)
Report to PACU, RN, vss, BBS= Clear.  

## 2022-02-19 NOTE — Progress Notes (Signed)
See office note dated 02/06/2022 for details ?Patient presenting for upper endoscopy and colonoscopy ?EGD to evaluate history of GERD but also abnormal barium esophagram suggestive of distal esophageal stricture impeding barium tablet.  Colonoscopy is for screening. ? ?She remains appropriate for outpatient EGD and colonoscopy in the [facility] today ? ?The nature of the procedure, as well as the risks, benefits, and alternatives were carefully and thoroughly reviewed with the patient. Ample time for discussion and questions allowed. The patient understood, was satisfied, and agreed to proceed.  ? ?

## 2022-02-19 NOTE — Op Note (Signed)
[facility] ?Patient Name: [name] ?Procedure Date: 02/19/2022 9:55 AM ?MRN: [id number] ?Endoscopist: Jerene Bears , MD ?Age: 51 ?Referring MD:  ?Date of Birth: [birth year] ?Gender: Female ?Account #: [id number] ?Procedure:                Upper GI endoscopy ?Indications:              Abnormal cine-esophagram with poor clearance of  ?                          barium tablet in distal esophagus raising the  ?                          question of a stricture; hx of GERD resolved with  ?                          initiation of CPAP ?Medicines:                Monitored Anesthesia Care ?Procedure:                Pre-Anesthesia Assessment: ?                          - Prior to the procedure, a History and Physical  ?                          was performed, and patient medications and  ?                          allergies were reviewed. The patient's tolerance of  ?                          previous anesthesia was also reviewed. The risks  ?                          and benefits of the procedure and the sedation  ?                          options and risks were discussed with the patient.  ?                          All questions were answered, and informed consent  ?                          was obtained. Prior Anticoagulants: The patient has  ?                          taken no previous anticoagulant or antiplatelet  ?                          agents. ASA Grade Assessment: II - A patient with  ?                          mild systemic disease. After reviewing the risks  ?  and benefits, the patient was deemed in  ?                          satisfactory condition to undergo the procedure. ?                          After obtaining informed consent, the endoscope was  ?                          passed under direct vision. Throughout the  ?                          procedure, the patient's blood pressure, pulse, and  ?                          oxygen saturations were monitored continuously.  The  ?                          Endoscope was introduced through the mouth, and  ?                          advanced to the second part of duodenum. The upper  ?                          GI endoscopy was accomplished without difficulty.  ?                          The patient tolerated the procedure well. ?Scope In: ?Scope Out: ?Findings:                 Regular Z-line at 40 cm. A non-obstructing Schatzki  ?                          ring was found at the gastroesophageal junction. A  ?                          TTS dilator was passed through the scope. Dilation  ?                          with a 16-17-18 mm balloon dilator was performed to  ?                          18 mm. The dilation site was examined and showed  ?                          mild mucosal disruption. ?                          Diffuse mild inflammation characterized by erythema  ?                          and granularity was found in the gastric body.  ?  Biopsies were taken with a cold forceps for  ?                          histology and Helicobacter pylori testing. ?                          The cardia and gastric fundus were normal on  ?                          retroflexion. ?                          The examined duodenum was normal. ?Complications:            No immediate complications. ?Estimated Blood Loss:     Estimated blood loss was minimal. ?Impression:               - Non-obstructing Schatzki ring. Dilated to 18 mm  ?                          with balloon. ?                          - Mild gastritis. Biopsied. ?                          - Normal examined duodenum. ?Recommendation:           - Patient has a contact number available for  ?                          emergencies. The signs and symptoms of potential  ?                          delayed complications were discussed with the  ?                          patient. Return to normal activities tomorrow.  ?                          Written discharge  instructions were provided to the  ?                          patient. ?                          - Resume previous diet. ?                          - Continue present medications. ?                          - Await pathology results. ?Jerene Bears, MD ?02/19/2022 10:36:41 AM ?This report has been signed electronically. ?

## 2022-02-19 NOTE — Patient Instructions (Addendum)
Read read the handouts given to you by your recovery room nurse. ? ?YOU HAD AN ENDOSCOPIC PROCEDURE TODAY AT [facility]:   Refer to the procedure report that was given to you for any specific questions about what was found during the examination.  If the procedure report does not answer your questions, please call your gastroenterologist to clarify.  If you requested that your care partner not be given the details of your procedure findings, then the procedure report has been included in a sealed envelope for you to review at your convenience later. ? ?YOU SHOULD EXPECT: Some feelings of bloating in the abdomen. Passage of more gas than usual.  Walking can help get rid of the air that was put into your GI tract during the procedure and reduce the bloating. If you had a lower endoscopy (such as a colonoscopy or flexible sigmoidoscopy) you may notice spotting of blood in your stool or on the toilet paper. If you underwent a bowel prep for your procedure, you may not have a normal bowel movement for a few days. ? ?Please Note:  You might notice some irritation and congestion in your nose or some drainage.  This is from the oxygen used during your procedure.  There is no need for concern and it should clear up in a day or so. ? ?SYMPTOMS TO REPORT IMMEDIATELY: ? ?Following lower endoscopy (colonoscopy or flexible sigmoidoscopy): ? Excessive amounts of blood in the stool ? Significant tenderness or worsening of abdominal pains ? Swelling of the abdomen that is new, acute ? Fever of 100?F or higher ? ?Following upper endoscopy (EGD) ? Vomiting of blood or coffee ground material ? New chest pain or pain under the shoulder blades ? Painful or persistently difficult swallowing ? New shortness of breath ? Fever of 100?F or higher ? Black, tarry-looking stools ? ?For urgent or emergent issues, a gastroenterologist can be reached at any hour by calling [phone]. ?Do not use MyChart messaging for urgent  concerns.  ? ? ?DIET:  We do recommend a small meal at first, but then you may proceed to your regular diet.  Drink plenty of fluids but you should avoid alcoholic beverages for 24 hours. Try to increase the fiber in your diet, and drink plenty of water. ? ?ACTIVITY:  You should plan to take it easy for the rest of today and you should NOT DRIVE or use heavy machinery until tomorrow (because of the sedation medicines used during the test).   ? ?FOLLOW UP: ?Our staff will call the number listed on your records 48-72 hours following your procedure to check on you and address any questions or concerns that you may have regarding the information given to you following your procedure. If we do not reach you, we will leave a message.  We will attempt to reach you two times.  During this call, we will ask if you have developed any symptoms of COVID 19. If you develop any symptoms (ie: fever, flu-like symptoms, shortness of breath, cough etc.) before then, please call [phone].  If you test positive for Covid 19 in the 2 weeks post procedure, please call and report this information to [place].   ? ?If any biopsies were taken you will be contacted by phone or by letter within the next 1-3 weeks.  Please call us at [phone] if you have not heard about the biopsies in 3 weeks.  ? ? ?SIGNATURES/CONFIDENTIALITY: ?You and/or your care partner have  signed paperwork which will be entered into your electronic medical record.  These signatures attest to the fact that that the information above on your After Visit Summary has been reviewed and is understood.  Full responsibility of the confidentiality of this discharge information lies with you and/or your care-partner.  ?

## 2022-02-19 NOTE — Progress Notes (Signed)
VS by CW  Pt's states no medical or surgical changes since previsit or office visit.  

## 2022-02-19 NOTE — Progress Notes (Signed)
Called to room to assist during endoscopic procedure.  Patient ID and intended procedure confirmed with present staff. Received instructions for my participation in the procedure from the performing physician.  

## 2022-02-19 NOTE — Op Note (Signed)
[facility] ?Patient Name: [name] ?Procedure Date: 02/19/2022 9:54 AM ?MRN: [id number] ?Endoscopist: Jerene Bears , MD ?Age: 51 ?Referring MD:  ?Date of Birth: 05/01/1971 ?Gender: Female ?Account #: [id number] ?Procedure:                Colonoscopy ?Indications:              Screening for colorectal malignant neoplasm, This  ?                          is the patient's first colonoscopy ?Medicines:                Monitored Anesthesia Care ?Procedure:                Pre-Anesthesia Assessment: ?                          - Prior to the procedure, a History and Physical  ?                          was performed, and patient medications and  ?                          allergies were reviewed. The patient's tolerance of  ?                          previous anesthesia was also reviewed. The risks  ?                          and benefits of the procedure and the sedation  ?                          options and risks were discussed with the patient.  ?                          All questions were answered, and informed consent  ?                          was obtained. Prior Anticoagulants: The patient has  ?                          taken no previous anticoagulant or antiplatelet  ?                          agents. ASA Grade Assessment: II - A patient with  ?                          mild systemic disease. After reviewing the risks  ?                          and benefits, the patient was deemed in  ?                          satisfactory condition to undergo the procedure. ?  After obtaining informed consent, the colonoscope  ?                          was passed under direct vision. Throughout the  ?                          procedure, the patient's blood pressure, pulse, and  ?                          oxygen saturations were monitored continuously. The  ?                          Olympus PCF-H190DL (#[id number]) Colonoscope was  ?                          introduced through the anus and  advanced to the  ?                          cecum, identified by appendiceal orifice and  ?                          ileocecal valve. The colonoscopy was performed  ?                          without difficulty. The patient tolerated the  ?                          procedure well. The quality of the bowel  ?                          preparation was good. The ileocecal valve,  ?                          appendiceal orifice, and rectum were photographed. ?Scope In: 10:14:47 AM ?Scope Out: 10:31:21 AM ?Scope Withdrawal Time: 0 hours 7 minutes 47 seconds  ?Total Procedure Duration: 0 hours 16 minutes 34 seconds  ?Findings:                 The digital rectal exam was normal. ?                          Multiple small-mouthed diverticula were found in  ?                          the sigmoid colon and descending colon. ?                          Internal hemorrhoids were found during  ?                          retroflexion. The hemorrhoids were small. ?                          The exam was otherwise without abnormality. ?Complications:            No immediate complications. ?  Estimated Blood Loss:     Estimated blood loss: none. ?Impression:               - Diverticulosis in the sigmoid colon and in the  ?                          descending colon. ?                          - Small internal hemorrhoids. ?                          - The examination was otherwise normal. ?                          - No specimens collected. ?Recommendation:           - Patient has a contact number available for  ?                          emergencies. The signs and symptoms of potential  ?                          delayed complications were discussed with the  ?                          patient. Return to normal activities tomorrow.  ?                          Written discharge instructions were provided to the  ?                          patient. ?                          - Resume previous diet. ?                          - Continue  present medications. ?                          - Repeat colonoscopy in 10 years for screening  ?                          purposes. ?Jerene Bears, MD ?02/19/2022 10:33:28 AM ?This report has been signed electronically. ?

## 2022-02-23 ENCOUNTER — Telehealth: Payer: Self-pay

## 2022-02-23 ENCOUNTER — Telehealth: Payer: Self-pay | Admitting: *Deleted

## 2022-02-23 NOTE — Telephone Encounter (Signed)
?  Follow up Call- ? ?Call back number 02/19/2022  ?Post procedure Call Back phone  # [phone]  ?Permission to leave phone message Yes  ?Some recent data might be hidden  ?  ? ?Patient questions: ? ?Do you have a fever, pain , or abdominal swelling? No. ?Pain Score  0 * ? ?Have you tolerated food without any problems? Yes.   ? ?Have you been able to return to your normal activities? Yes.   ? ?Do you have any questions about your discharge instructions: ?Diet   No. ?Medications  No. ?Follow up visit  No. ? ?Do you have questions or concerns about your Care? No. ? ?Actions: ?* If pain score is 4 or above: ?No action needed, pain <4. ? ? ?

## 2022-02-23 NOTE — Telephone Encounter (Signed)
First follow up call attempt.  LVM. 

## 2022-02-24 ENCOUNTER — Encounter: Payer: Self-pay | Admitting: Internal Medicine

## 2022-02-25 ENCOUNTER — Other Ambulatory Visit (HOSPITAL_BASED_OUTPATIENT_CLINIC_OR_DEPARTMENT_OTHER): Payer: Self-pay

## 2022-02-25 ENCOUNTER — Ambulatory Visit (INDEPENDENT_AMBULATORY_CARE_PROVIDER_SITE_OTHER): Payer: No Typology Code available for payment source

## 2022-02-25 ENCOUNTER — Ambulatory Visit: Payer: No Typology Code available for payment source | Admitting: Podiatry

## 2022-02-25 ENCOUNTER — Other Ambulatory Visit: Payer: Self-pay

## 2022-02-25 DIAGNOSIS — M722 Plantar fascial fibromatosis: Secondary | ICD-10-CM

## 2022-02-25 DIAGNOSIS — S92505A Nondisplaced unspecified fracture of left lesser toe(s), initial encounter for closed fracture: Secondary | ICD-10-CM

## 2022-02-25 MED ORDER — BETAMETHASONE SOD PHOS & ACET 6 (3-3) MG/ML IJ SUSP
3.0000 mg | Freq: Once | INTRAMUSCULAR | Status: AC
  Administered 2022-02-25: 3 mg via INTRA_ARTICULAR

## 2022-02-25 MED ORDER — METHYLPREDNISOLONE 4 MG PO TBPK
ORAL_TABLET | ORAL | 0 refills | Status: DC
  Filled 2022-02-25: qty 21, 6d supply, fill #0

## 2022-02-25 MED ORDER — MELOXICAM 15 MG PO TABS
15.0000 mg | ORAL_TABLET | Freq: Every day | ORAL | 1 refills | Status: DC
  Filled 2022-02-25: qty 30, 30d supply, fill #0

## 2022-02-25 NOTE — Progress Notes (Signed)
? ?Subjective: ?51 y.o. female presenting today as a new patient for 2 separate complaints.  First, the patient states that she has been experiencing right heel pain for several months.  She was seen here in the office back in 2014 for the same condition.  She was diagnosed with plantar fasciitis.  She believes that is what she has.  She has tried icing, stretching, night splinting with minimal improvement.  She also takes Motrin 800 mg for the pain.  She presents for further treatment and evaluation.  She denies a history of injury. ? ?Patient also states that about 2 weeks ago she stubbed her left fourth and fifth toes and it was very painful.  Patient had significant pain and tenderness.  It has improved somewhat over the last 2 weeks however she would like to have it evaluated today. ? ? ?Past Medical History:  ?Diagnosis Date  ? Abnormal uterine bleeding (AUB) 10/11/2015  ? ADD (attention deficit disorder)   ? Anxiety   ? Arthritis   ? oa  ? COVID feb 16th or feb 17th  ? 01-29-21 or 2-17-2022rapid home test cold symptoms x 4 days all symptoms resolved  ? COVID 04/04/2021  ? epic, asymptomatic  ? Elevated TSH since jan 2022  ? thryoid med adjusted see dr Loanne Drilling 05-02-2021  ? Fatty liver   ? no problems in several yrs as of 04-02-21 per pt  ? GERD (gastroesophageal reflux disease)   ? Heavy periods   ? Hypertension   ? Hypothyroidism   ? Iron deficiency anemia   ? Sleep apnea   ? Thyroid nodule   ? US done 2013 sees dr Ebony Hail may 20th 2022 for not sure which side no swallowing problems  ? Vitamin D deficiency   ? ? ? ?Objective: ?Physical Exam ?General: The patient is alert and oriented x3 in no acute distress. ? ?Dermatology: Skin is warm, dry and supple bilateral lower extremities. Negative for open lesions or macerations bilateral.  Piezogenic papules noted to the posterior heel ? ?Vascular: Dorsalis Pedis and Posterior Tibial pulses palpable bilateral.  Capillary fill time is immediate to all  digits. ? ?Neurological: Epicritic and protective threshold intact bilateral.  ? ?Musculoskeletal: Tenderness to palpation to the plantar aspect of the right heel along the plantar fascia. All other joints range of motion within normal limits bilateral. Strength 5/5 in all groups bilateral.  ? ?There is also tenderness with light touch with some edema to the fifth toe of the left foot.  Patient states that the tenderness extends throughout the forefoot as well. ? ?Radiographic exam: ?Normal osseous mineralization. Joint spaces preserved. No fracture/dislocation/boney destruction. No other soft tissue abnormalities or radiopaque foreign bodies.  ?Transverse fracture noted across the proximal phalanx of the fifth toe left foot.  Closed, nondisplaced. ? ?Assessment: ?1. Plantar fasciitis right ?2.  Fracture left fifth toe; closed, nondisplaced, initial encounter ? ?Plan of Care:  ?1. Patient evaluated. Xrays reviewed.   ?2. Injection of 0.5cc Celestone soluspan injected into the right plantar fascia  ?3. Rx for Medrol Dose Pack placed ?4. Rx for Meloxicam ordered for patient. ?5. Plantar fascial band(s) dispensed ?6. Instructed patient regarding therapies and modalities at home to alleviate symptoms.  ?7.  In regards to the toe fracture of the left foot, I did offer a postsurgical shoe.  Patient declined and states that she will simply be very cautious of the toe as it heals over the next few months  ?8.  Return to clinic  in 4 weeks.   ? ? ?Edrick Kins, DPM ?[place] ? ?Dr. Edrick Kins, DPM  ?  ?[address]                                        ?[place], [place] [address]                ?Office [phone]  ?Fax [phone] ? ? ? ? ?

## 2022-03-04 ENCOUNTER — Other Ambulatory Visit (HOSPITAL_BASED_OUTPATIENT_CLINIC_OR_DEPARTMENT_OTHER): Payer: Self-pay

## 2022-03-05 ENCOUNTER — Other Ambulatory Visit (HOSPITAL_BASED_OUTPATIENT_CLINIC_OR_DEPARTMENT_OTHER): Payer: Self-pay

## 2022-03-05 MED ORDER — CYCLOBENZAPRINE HCL 10 MG PO TABS
ORAL_TABLET | ORAL | 1 refills | Status: DC
  Filled 2022-03-05: qty 90, 90d supply, fill #0
  Filled 2022-06-05: qty 90, 90d supply, fill #1

## 2022-03-16 LAB — CBC WITH DIFFERENTIAL/PLATELET
Absolute Monocytes: 524 cells/uL (ref 200–950)
Basophils Absolute: 48 cells/uL (ref 0–200)
Basophils Relative: 0.7 %
Eosinophils Absolute: 150 cells/uL (ref 15–500)
Eosinophils Relative: 2.2 %
HCT: 41.9 % (ref 35.0–45.0)
Hemoglobin: 14.1 g/dL (ref 11.7–15.5)
Lymphs Abs: 1564 cells/uL (ref 850–3900)
MCH: 30 pg (ref 27.0–33.0)
MCHC: 33.7 g/dL (ref 32.0–36.0)
MCV: 89.1 fL (ref 80.0–100.0)
MPV: 10.6 fL (ref 7.5–12.5)
Monocytes Relative: 7.7 %
Neutro Abs: 4515 cells/uL (ref 1500–7800)
Neutrophils Relative %: 66.4 %
Platelets: 295 10*3/uL (ref 140–400)
RBC: 4.7 10*6/uL (ref 3.80–5.10)
RDW: 13.4 % (ref 11.0–15.0)
Total Lymphocyte: 23 %
WBC: 6.8 10*3/uL (ref 3.8–10.8)

## 2022-03-16 LAB — COMPREHENSIVE METABOLIC PANEL
AG Ratio: 1.5 (calc) (ref 1.0–2.5)
ALT: 88 U/L — ABNORMAL HIGH (ref 6–29)
AST: 112 U/L — ABNORMAL HIGH (ref 10–35)
Albumin: 4.4 g/dL (ref 3.6–5.1)
Alkaline phosphatase (APISO): 173 U/L — ABNORMAL HIGH (ref 37–153)
BUN/Creatinine Ratio: 15 (calc) (ref 6–22)
BUN: 18 mg/dL (ref 7–25)
CO2: 28 mmol/L (ref 20–32)
Calcium: 9.6 mg/dL (ref 8.6–10.4)
Chloride: 103 mmol/L (ref 98–110)
Creat: 1.22 mg/dL — ABNORMAL HIGH (ref 0.50–1.03)
Globulin: 2.9 g/dL (calc) (ref 1.9–3.7)
Glucose, Bld: 119 mg/dL — ABNORMAL HIGH (ref 65–99)
Potassium: 3.9 mmol/L (ref 3.5–5.3)
Sodium: 143 mmol/L (ref 135–146)
Total Bilirubin: 0.4 mg/dL (ref 0.2–1.2)
Total Protein: 7.3 g/dL (ref 6.1–8.1)

## 2022-03-16 LAB — TSH: TSH: 1 mIU/L

## 2022-03-16 LAB — HEMOGLOBIN A1C
Hgb A1c MFr Bld: 6.5 % of total Hgb — ABNORMAL HIGH (ref <5.7)
Mean Plasma Glucose: 140 mg/dL
eAG (mmol/L): 7.7 mmol/L

## 2022-03-16 LAB — LIPID PANEL
Cholesterol: 217 mg/dL — ABNORMAL HIGH (ref <200)
HDL: 32 mg/dL — ABNORMAL LOW (ref >=50)
LDL Cholesterol (Calc): 143 mg/dL (calc) — ABNORMAL HIGH
Non-HDL Cholesterol (Calc): 185 mg/dL (calc) — ABNORMAL HIGH (ref <130)
Total CHOL/HDL Ratio: 6.8 (calc) — ABNORMAL HIGH (ref <5.0)
Triglycerides: 275 mg/dL — ABNORMAL HIGH (ref <150)

## 2022-03-16 LAB — IRON,TIBC AND FERRITIN PANEL
%SAT: 29 % (calc) (ref 16–45)
Ferritin: 106 ng/mL (ref 16–232)
Iron: 98 ug/dL (ref 45–160)
TIBC: 336 mcg/dL (calc) (ref 250–450)

## 2022-03-16 LAB — VITAMIN D 25 HYDROXY (VIT D DEFICIENCY, FRACTURES): Vit D, 25-Hydroxy: 42 ng/mL (ref 30–100)

## 2022-03-23 ENCOUNTER — Encounter: Payer: No Typology Code available for payment source | Attending: General Surgery | Admitting: Skilled Nursing Facility1

## 2022-03-23 DIAGNOSIS — Z6841 Body Mass Index (BMI) 40.0 and over, adult: Secondary | ICD-10-CM | POA: Diagnosis not present

## 2022-03-23 DIAGNOSIS — Z713 Dietary counseling and surveillance: Secondary | ICD-10-CM | POA: Insufficient documentation

## 2022-03-25 NOTE — Progress Notes (Signed)
Pre-Operative Nutrition Class:   ? ?Patient was seen on 03/23/2022 for Pre-Operative Bariatric Surgery Education at the [facility].   ? ?Surgery date: 04/21/2022 ?Surgery type: RYGB ?Start weight at [facility]: 243.8 ?Weight today: 254.1 ? ?Samples given per MNT protocol. Patient educated on appropriate usage: ?Bariatric Advantage Multivitamin ?Lot #[id number] ?Exp:10/23 ?  ?Bariatric Advantage Calcium  ?Lot #[id number] ?Exp:10/19/2022 ?  ?Protein Shake Ensure Max ?Lot # [id number] ?Exp: 01/14/2023 ?  ?Bariatric Advantage Protein Powder ?Lot # [id number] ?Exp: 02/24 ? ?The following the learning objectives were met by the patient during this course: ?Identify Pre-Op Dietary Goals and will begin 2 weeks pre-operatively ?Identify appropriate sources of fluids and proteins  ?State protein recommendations and appropriate sources pre and post-operatively ?Identify Post-Operative Dietary Goals and will follow for 2 weeks post-operatively ?Identify appropriate multivitamin and calcium sources ?Describe the need for physical activity post-operatively and will follow MD recommendations ?State when to call healthcare provider regarding medication questions or post-operative complications ?When having a diagnosis of diabetes understanding hypoglycemia symptoms and the inclusion of 1 complex carbohydrate per meal ? ?Handouts given during class include: ?Pre-Op Bariatric Surgery Diet Handout ?Protein Shake Handout ?Post-Op Bariatric Surgery Nutrition Handout ?BELT Program Information Flyer ?Support Group Information Flyer ?[facility] Bariatric Supplements Price List ? ?Follow-Up Plan: ?Patient will follow-up at [facility] 2 weeks post operatively for diet advancement per MD.  ?

## 2022-03-30 ENCOUNTER — Ambulatory Visit: Payer: No Typology Code available for payment source | Admitting: Podiatry

## 2022-03-30 DIAGNOSIS — M722 Plantar fascial fibromatosis: Secondary | ICD-10-CM | POA: Diagnosis not present

## 2022-03-30 DIAGNOSIS — S92505A Nondisplaced unspecified fracture of left lesser toe(s), initial encounter for closed fracture: Secondary | ICD-10-CM

## 2022-04-03 NOTE — Progress Notes (Signed)
Sent message, via epic in basket, requesting orders in epic from surgeon.  

## 2022-04-06 ENCOUNTER — Ambulatory Visit: Payer: Self-pay | Admitting: General Surgery

## 2022-04-06 DIAGNOSIS — N920 Excessive and frequent menstruation with regular cycle: Secondary | ICD-10-CM

## 2022-04-06 DIAGNOSIS — I10 Essential (primary) hypertension: Secondary | ICD-10-CM

## 2022-04-06 DIAGNOSIS — M722 Plantar fascial fibromatosis: Secondary | ICD-10-CM | POA: Diagnosis not present

## 2022-04-06 MED ORDER — BETAMETHASONE SOD PHOS & ACET 6 (3-3) MG/ML IJ SUSP
3.0000 mg | Freq: Once | INTRAMUSCULAR | Status: AC
  Administered 2022-04-06: 3 mg via INTRA_ARTICULAR

## 2022-04-06 NOTE — Progress Notes (Signed)
? ?Subjective: ?51 y.o. female presenting today for follow-up evaluation of right heel pain has been going on for several months as well as a toe fracture.  Patient states that her toe is no longer painful and has no symptoms associated.  She continues to have some slight residual heel pain.  She is taking the meloxicam as prescribed.  She does notice significant improvement but there continues to be some slight tenderness to the area. ? ?Past Medical History:  ?Diagnosis Date  ? Abnormal uterine bleeding (AUB) 10/11/2015  ? ADD (attention deficit disorder)   ? Anxiety   ? Arthritis   ? oa  ? COVID feb 16th or feb 17th  ? 01-29-21 or 2-17-2022rapid home test cold symptoms x 4 days all symptoms resolved  ? COVID 04/04/2021  ? epic, asymptomatic  ? Elevated TSH since jan 2022  ? thryoid med adjusted see dr Loanne Drilling 05-02-2021  ? Fatty liver   ? no problems in several yrs as of 04-02-21 per pt  ? GERD (gastroesophageal reflux disease)   ? Heavy periods   ? Hypertension   ? Hypothyroidism   ? Iron deficiency anemia   ? Sleep apnea   ? Thyroid nodule   ? US done 2013 sees dr Ebony Hail may 20th 2022 for not sure which side no swallowing problems  ? Vitamin D deficiency   ? ? ? ?Objective: ?Physical Exam ?General: The patient is alert and oriented x3 in no acute distress. ? ?Dermatology: Skin is warm, dry and supple bilateral lower extremities. Negative for open lesions or macerations bilateral.  Piezogenic papules noted to the posterior heel ? ?Vascular: Dorsalis Pedis and Posterior Tibial pulses palpable bilateral.  Capillary fill time is immediate to all digits. ? ?Neurological: Epicritic and protective threshold intact bilateral.  ? ?Musculoskeletal: There continues to be some tenderness to palpation to the plantar aspect of the right heel along the plantar fascia. All other joints range of motion within normal limits bilateral. Strength 5/5 in all groups bilateral.  ? ?Fifth toe left foot asymptomatic with palpation.  No  tenderness noted. ? ?Radiographic exam 02/25/2022: ?Normal osseous mineralization. Joint spaces preserved. No fracture/dislocation/boney destruction. No other soft tissue abnormalities or radiopaque foreign bodies.  ?Transverse fracture noted across the proximal phalanx of the fifth toe left foot.  Closed, nondisplaced. ? ?Assessment: ?1. Plantar fasciitis right ?2.  Fracture left fifth toe; closed, nondisplaced, subsequent encounter with routine healing ? ?Plan of Care:  ?1. Patient evaluated. Xrays reviewed.   ?2. Injection of 0.5cc Celestone soluspan injected into the right plantar fascia  ?3.  Continue meloxicam as prescribed.  Patient states that she is having bariatric surgery coming up and after bariatric surgery she has not going to be able to take NSAIDs.  Discontinue as recommended by bariatric surgeon ?4.  Continue plantar fascial brace as needed ?5.  Continue wearing good supportive shoes and sneakers ?6.  In regards to the toe fracture of the left foot, continue good shoes that do not irritate the toe. ?7.  Return to clinic as needed ? ? ?Edrick Kins, DPM ?[place] ? ?Dr. Edrick Kins, DPM  ?  ?[address]                                        ?[place], [place] [address]                ?  Office [phone]  ?Fax [phone] ? ? ? ? ?

## 2022-04-08 ENCOUNTER — Other Ambulatory Visit (HOSPITAL_COMMUNITY): Payer: Self-pay

## 2022-04-08 NOTE — Progress Notes (Signed)
DUE TO COVID-19 ONLY  2  VISITOR IS ALLOWED TO COME WITH YOU AND STAY IN THE WAITING ROOM ONLY DURING PRE OP AND PROCEDURE DAY OF SURGERY.   2 4 VISITOR  MAY VISIT WITH YOU AFTER SURGERY IN YOUR PRIVATE ROOM DURING VISITING HOURS ONLY! ?YOU MAY HAVE ONE PERSON SPEND THE NITE WITH YOU IN YOUR ROOM AFTER SURGERY.   ? ? ? Your procedure is scheduled on:  ?   04/21/2022  ? Report to [facility] Main  Entrance ? ? Report to admitting at     0515             AM ?DO NOT [place], PICTURE ID OR WALLET DAY OF SURGERY.  ?  ? ? Call this number if you have problems the morning of surgery [phone]  ? ? REMEMBER: NO  SOLID FOODS , CANDY, GUM OR MINTS AFTER MIDNITE THE NITE BEFORE SURGERY .       Marland Kitchen CLEAR LIQUIDS UNTIL    0430am             DAY OF SURGERY.      PLEASE FINISH  G2 Lower sugar  DRINK PER SURGEON ORDER  WHICH NEEDS TO BE COMPLETED AT  0430am         MORNING OF SURGERY.   ? ? ? ? ?CLEAR LIQUID DIET ? ? ?Foods Allowed      ?WATER ?BLACK COFFEE ( SUGAR OK, NO MILK, CREAM OR CREAMER) REGULAR AND DECAF  ?TEA ( SUGAR OK NO MILK, CREAM, OR CREAMER) REGULAR AND DECAF  ?PLAIN JELLO ( NO RED)  ?FRUIT ICES ( NO RED, NO FRUIT PULP)  ?POPSICLES ( NO RED)  ?JUICE- APPLE, WHITE GRAPE AND WHITE CRANBERRY  ?SPORT DRINK LIKE GATORADE ( NO RED)  ?CLEAR BROTH ( VEGETABLE , CHICKEN OR BEEF)                                                               ? ?    ? ?BRUSH YOUR TEETH MORNING OF SURGERY AND RINSE YOUR MOUTH OUT, NO CHEWING GUM CANDY OR MINTS. ?  ? ? Take these medicines the morning of surgery with A SIP OF WATER:  flonase, synthroid  ? ? ?DO NOT TAKE ANY DIABETIC MEDICATIONS DAY OF YOUR SURGERY ?                  ?            You may not have any metal on your body including hair pins and  ?            piercings  Do not wear jewelry, make-up, lotions, powders or perfumes, deodorant ?            Do not wear nail polish on your fingernails.   ?           IF YOU ARE A FEMALE AND WANT TO SHAVE UNDER ARMS OR  LEGS PRIOR TO SURGERY YOU MUST DO SO AT LEAST 48 HOURS PRIOR TO SURGERY.  ?            Men may shave face and neck. ? ? Do not bring valuables to the hospital. [facility] NOT ?  RESPONSIBLE   FOR VALUABLES. ? Contacts, dentures or bridgework may not be worn into surgery. ? Leave suitcase in the car. After surgery it may be brought to your room. ? ?  ? Patients discharged the day of surgery will not be allowed to drive home. IF YOU ARE HAVING SURGERY AND GOING HOME THE SAME DAY, YOU MUST HAVE AN ADULT TO DRIVE YOU HOME AND BE WITH YOU FOR 24 HOURS. YOU MAY GO HOME BY TAXI OR UBER OR ORTHERWISE, BUT AN ADULT MUST ACCOMPANY YOU HOME AND STAY WITH YOU FOR 24 HOURS. ?  ? ?            Please read over the following fact sheets you were given: ?_____________________________________________________________________ ? ?[facility] - Preparing for Surgery ?Before surgery, you can play an important role.  Because skin is not sterile, your skin needs to be as free of germs as possible.  You can reduce the number of germs on your skin by washing with CHG (chlorahexidine gluconate) soap before surgery.  CHG is an antiseptic cleaner which kills germs and bonds with the skin to continue killing germs even after washing. ?Please DO NOT use if you have an allergy to CHG or antibacterial soaps.  If your skin becomes reddened/irritated stop using the CHG and inform your nurse when you arrive at Short Stay. ?Do not shave (including legs and underarms) for at least 48 hours prior to the first CHG shower.  You may shave your face/neck. ?Please follow these instructions carefully: ? 1.  Shower with CHG Soap the night before surgery and the  morning of Surgery. ? 2.  If you choose to wash your hair, wash your hair first as usual with your  normal  shampoo. ? 3.  After you shampoo, rinse your hair and body thoroughly to remove the  shampoo.                           4.  Use CHG as you would any other liquid soap.  You can apply  chg directly  to the skin and wash  ?                     Gently with a scrungie or clean washcloth. ? 5.  Apply the CHG Soap to your body ONLY FROM THE NECK DOWN.   Do not use on face/ open      ?                     Wound or open sores. Avoid contact with eyes, ears mouth and genitals (private parts).  ?                     Production manager,  Genitals (private parts) with your normal soap. ?            6.  Wash thoroughly, paying special attention to the area where your surgery  will be performed. ? 7.  Thoroughly rinse your body with warm water from the neck down. ? 8.  DO NOT shower/wash with your normal soap after using and rinsing off  the CHG Soap. ?               9.  Pat yourself dry with a clean towel. ?           10.  Wear clean pajamas. ?  11.  Place clean sheets on your bed the night of your first shower and do not  sleep with pets. ?Day of Surgery : ?Do not apply any lotions/deodorants the morning of surgery.  Please wear clean clothes to the hospital/surgery center. ? ?FAILURE TO FOLLOW THESE INSTRUCTIONS MAY RESULT IN THE CANCELLATION OF YOUR SURGERY ?PATIENT SIGNATURE_________________________________ ? ?NURSE SIGNATURE__________________________________ ? ?________________________________________________________________________  ? ? ?           ?

## 2022-04-08 NOTE — Progress Notes (Addendum)
Anesthesia Review: ? ?PCP: Carloyn Manner fagan  ?Cardiologist : DR Percival Spanish [facility] 05/28/21  ?Pulmonology- Tammy  Parrett,NP- [facility] 12/25/21  ?Chest x-ray : 01/26/22  ?EKG : 05/14/21  ?Echo :2016  ?Stress test:06/04/21  ?Cardiac Cath :  ?Activity level: can do a flgiht of stairs without difficulty  ?Sleep Study/ CPAP : has cpap  ?Fasting Blood Sugar :      / Checks Blood Sugar -- times a day:   ?Blood Thinner/ Instructions /Last Dose: ?ASA / Instructions/ Last Dose :  ?CMP done 04/10/22 rotued to Dr Greer Pickerel.   ?Shawn Stall made aware of  CMP  results on 04/10/22.   ?

## 2022-04-10 ENCOUNTER — Encounter (HOSPITAL_COMMUNITY): Payer: Self-pay

## 2022-04-10 ENCOUNTER — Encounter (HOSPITAL_COMMUNITY)
Admission: RE | Admit: 2022-04-10 | Discharge: 2022-04-10 | Disposition: A | Discharge status: Home / Self Care | Payer: No Typology Code available for payment source | Source: Ambulatory Visit | Attending: General Surgery | Admitting: General Surgery

## 2022-04-10 ENCOUNTER — Other Ambulatory Visit: Payer: Self-pay

## 2022-04-10 DIAGNOSIS — Z01812 Encounter for preprocedural laboratory examination: Secondary | ICD-10-CM | POA: Diagnosis present

## 2022-04-10 DIAGNOSIS — N920 Excessive and frequent menstruation with regular cycle: Secondary | ICD-10-CM | POA: Diagnosis not present

## 2022-04-10 DIAGNOSIS — I10 Essential (primary) hypertension: Secondary | ICD-10-CM | POA: Insufficient documentation

## 2022-04-10 LAB — CBC WITH DIFFERENTIAL/PLATELET
Abs Immature Granulocytes: 0.04 10*3/uL (ref 0.00–0.07)
Basophils Absolute: 0.1 10*3/uL (ref 0.0–0.1)
Basophils Relative: 1 %
Eosinophils Absolute: 0.3 10*3/uL (ref 0.0–0.5)
Eosinophils Relative: 3 %
HCT: 44.4 % (ref 36.0–46.0)
Hemoglobin: 14.9 g/dL (ref 12.0–15.0)
Immature Granulocytes: 1 %
Lymphocytes Relative: 20 %
Lymphs Abs: 1.6 10*3/uL (ref 0.7–4.0)
MCH: 30.5 pg (ref 26.0–34.0)
MCHC: 33.6 g/dL (ref 30.0–36.0)
MCV: 90.8 fL (ref 80.0–100.0)
Monocytes Absolute: 0.6 10*3/uL (ref 0.1–1.0)
Monocytes Relative: 7 %
Neutro Abs: 5.5 10*3/uL (ref 1.7–7.7)
Neutrophils Relative %: 68 %
Platelets: 253 10*3/uL (ref 150–400)
RBC: 4.89 MIL/uL (ref 3.87–5.11)
RDW: 14.1 % (ref 11.5–15.5)
WBC: 8.1 10*3/uL (ref 4.0–10.5)
nRBC: 0 % (ref 0.0–0.2)

## 2022-04-10 LAB — COMPREHENSIVE METABOLIC PANEL
ALT: 227 U/L — ABNORMAL HIGH (ref 0–44)
AST: 180 U/L — ABNORMAL HIGH (ref 15–41)
Albumin: 4.6 g/dL (ref 3.5–5.0)
Alkaline Phosphatase: 140 U/L — ABNORMAL HIGH (ref 38–126)
Anion gap: 10 (ref 5–15)
BUN: 29 mg/dL — ABNORMAL HIGH (ref 6–20)
CO2: 24 mmol/L (ref 22–32)
Calcium: 9.7 mg/dL (ref 8.9–10.3)
Chloride: 104 mmol/L (ref 98–111)
Creatinine, Ser: 1.12 mg/dL — ABNORMAL HIGH (ref 0.44–1.00)
GFR, Estimated: 59 mL/min — ABNORMAL LOW (ref >60)
Glucose, Bld: 129 mg/dL — ABNORMAL HIGH (ref 70–99)
Potassium: 3.4 mmol/L — ABNORMAL LOW (ref 3.5–5.1)
Sodium: 138 mmol/L (ref 135–145)
Total Bilirubin: 0.8 mg/dL (ref 0.3–1.2)
Total Protein: 7.9 g/dL (ref 6.5–8.1)

## 2022-04-10 LAB — TYPE AND SCREEN
ABO/RH(D): O POS
Antibody Screen: NEGATIVE

## 2022-04-13 NOTE — Progress Notes (Addendum)
Anesthesia Chart Review ? ? Case: [id number] Date/Time: 04/21/22 0715  ? Procedure: LAPAROSCOPIC ROUX-EN-Y GASTRIC BYPASS WITH UPPER ENDOSCOPY  ? Anesthesia type: General  ? Pre-op diagnosis: morbid obesity  ? Location: [facility] ROOM 01 / [facility]  ? Surgeons: Greer Pickerel, MD  ? ?  ? ? ?DISCUSSION:51 y.o. never smoker with h/o GERD, HTN, sleep apnea with cpap, morbid obesity scheduled for above procedure 04/21/2022 with Dr. Greer Pickerel.  ? ?Elevated AST/ALT.  Discussed with GI.  Per Dr. Hilarie Fredrickson, "That is a new elevation and a bit more transaminitis than I would expect with fatty liver alone. ?I agree that I am not convinced that should stop surgery but I would recommend further evaluation with labs and if they are negative possibly even a liver biopsy at the time of her gastric bypass surgery. ?Would investigate any new medications and check viral hepatitis serologies, ANA, IgG, AMA, ASMA, ferritin + IBC." ? ?Labs to be repeated prior to surgery.  ?VS: BP 121/85   Pulse 78   Temp 36.9 ?C (Oral)   Resp 16   Ht '5\' 6"'$  (1.676 m)   Wt 115 kg   LMP 03/14/2021   SpO2 98%   BMI 40.92 kg/m?  ? ?PROVIDERS: ?Asencion Noble, MD is PCP  ? ? ?LABS:  forwarded to GI ?(all labs ordered are listed, but only abnormal results are displayed) ? ?Labs Reviewed  ?COMPREHENSIVE METABOLIC PANEL - Abnormal; Notable for the following components:  ?    Result Value  ? Potassium 3.4 (*)   ? Glucose, Bld 129 (*)   ? BUN 29 (*)   ? Creatinine, Ser 1.12 (*)   ? AST 180 (*)   ? ALT 227 (*)   ? Alkaline Phosphatase 140 (*)   ? GFR, Estimated 59 (*)   ? All other components within normal limits  ?CBC WITH DIFFERENTIAL/PLATELET  ?TYPE AND SCREEN  ? ? ? ?IMAGES: ? ? ?EKG: ?05/14/21 ?Rate 100 bpm ?Sinus tachycardia  ?Multiple ventricular premature complexes ?Repo abnrm suggests ischemia, anterolateral ? ?CV: ?Myocardial Perfusion 06/04/21 ?There was no ST segment deviation noted during stress. ?The study is normal. ?This is a low risk study. ?The left ventricular  ejection fraction is normal (55-65%). ?  ?Normal resting and stress perfusion. No ischemia or infarction EF ?65% ? ?Echo 11/20/2015 ?Study Conclusions  ? ?- Left ventricle: The cavity size was normal. Wall thickness was  ?  increased in a pattern of mild LVH. Systolic function was normal.  ?  The estimated ejection fraction was in the range of 60% to 65%.  ?  Wall motion was normal; there were no regional wall motion  ?  abnormalities. Left ventricular diastolic function parameters  ?  were normal.  ?- Mitral valve: No echocardiographic evidence for prolapse. There  ?  was mild regurgitation.  ?- Tricuspid valve: There was trivial regurgitation. ?Past Medical History:  ?Diagnosis Date  ? Abnormal uterine bleeding (AUB) 10/11/2015  ? ADD (attention deficit disorder)   ? Arthritis   ? oa  ? COVID feb 16th or feb 17th  ? 01-29-21 or 2-17-2022rapid home test cold symptoms x 4 days all symptoms resolved  ? COVID 04/04/2021  ? epic, asymptomatic  ? Elevated TSH since jan 2022  ? thryoid med adjusted see dr Loanne Drilling 05-02-2021  ? Fatty liver   ? no problems in several yrs as of 04-02-21 per pt  ? GERD (gastroesophageal reflux disease)   ? Heavy periods   ? Hypertension   ?  Hypothyroidism   ? Sleep apnea   ? cpap  ? Thyroid nodule   ? US done 2013 sees dr Ebony Hail may 20th 2022 for not sure which side no swallowing problems  ? Vitamin D deficiency   ? ? ?Past Surgical History:  ?Procedure Laterality Date  ? CYSTOSCOPY  06/10/2021  ? Procedure: CYSTOSCOPY;  Surgeon: Princess Bruins, MD;  Location: [facility];  Service: Gynecology;;  ? JOINT REPLACEMENT    ? ROBOTIC ASSISTED TOTAL HYSTERECTOMY WITH BILATERAL SALPINGO OOPHERECTOMY Bilateral 06/10/2021  ? Procedure: XI ROBOTIC ASSISTED TOTAL LAPAROSCOPIC HYSTERECTOMY WITH BILATERAL SALPINGECTOMY, REPAIR OF LEFT SMALL LABIA MINORA TEAR;  Surgeon: Princess Bruins, MD;  Location: [facility];  Service: Gynecology;  Laterality: Bilateral;  ? tooth  implant  2016  ? TOTAL KNEE ARTHROPLASTY Left 12/27/2018  ? Procedure: LEFT TOTAL KNEE ARTHROPLASTY;  Surgeon: Mcarthur Rossetti, MD;  Location: [facility];  Service: Orthopedics;  Laterality: Left;  ? TUBAL LIGATION  ~2000  ? WISDOM TOOTH EXTRACTION  age 75  ? ? ?MEDICATIONS: ? acetaminophen (TYLENOL) 500 MG tablet  ? Cholecalciferol (VITAMIN D3 PO)  ? cyclobenzaprine (FLEXERIL) 10 MG tablet  ? diphenoxylate-atropine (LOMOTIL) 2.5-0.025 MG tablet  ? escitalopram (LEXAPRO) 10 MG tablet  ? estradiol (VIVELLE-DOT) 0.075 MG/24HR  ? fluticasone (FLONASE) 50 MCG/ACT nasal spray  ? ibuprofen (ADVIL) 400 MG tablet  ? levothyroxine (SYNTHROID) 150 MCG tablet  ? meloxicam (MOBIC) 15 MG tablet  ? methylPREDNISolone (MEDROL DOSEPAK) 4 MG TBPK tablet  ? Multiple Vitamins-Minerals (BARIATRIC FUSION PO)  ? valsartan-hydrochlorothiazide (DIOVAN-HCT) 320-25 MG tablet  ? ?No current facility-administered medications for this encounter.  ? ? ? ?Konrad Felix Ward, PA-C ?[facility] ?[phone] ? ? ? ? ? ?

## 2022-04-15 ENCOUNTER — Other Ambulatory Visit (INDEPENDENT_AMBULATORY_CARE_PROVIDER_SITE_OTHER): Payer: No Typology Code available for payment source

## 2022-04-15 ENCOUNTER — Telehealth: Payer: Self-pay

## 2022-04-15 DIAGNOSIS — R7989 Other specified abnormal findings of blood chemistry: Secondary | ICD-10-CM

## 2022-04-15 DIAGNOSIS — K760 Fatty (change of) liver, not elsewhere classified: Secondary | ICD-10-CM

## 2022-04-15 LAB — IBC + FERRITIN
Ferritin: 90.2 ng/mL (ref 10.0–291.0)
Iron: 124 ug/dL (ref 42–145)
Saturation Ratios: 31.2 % (ref 20.0–50.0)
TIBC: 397.6 ug/dL (ref 250.0–450.0)
Transferrin: 284 mg/dL (ref 212.0–360.0)

## 2022-04-15 NOTE — H&P (Signed)
LFTs are ok for Surgery - ?Pt has fatty liver so not unexpected LFTs elevated ?No addl workup needed ? ?Leighton Ruff. Redmond Pulling, MD, FACS ?General, Bariatric, & Minimally Invasive Surgery ?[facility], [place] ? ?

## 2022-04-15 NOTE — Telephone Encounter (Signed)
Pt returned call. We have reviewed Dr. Vena Rua recommendations below. Pt knows to come by the lab today or tomorrow so that we can have the results back prior to her upcoming surgery. She is aware that if labs are normal then a liver biopsy at the time of her scheduled surgery may be needed. Pt verbalized understanding of all information and had no concerns at the end of the call. ? ?Lab orders in epic. ?

## 2022-04-15 NOTE — Telephone Encounter (Signed)
Lm on vm for patient to return call 

## 2022-04-15 NOTE — Telephone Encounter (Signed)
-----   Message from Levin Erp, [place] sent at 04/15/2022 10:29 AM EDT ----- ?Regarding: FW: Please review chart ?Janett Billow, ? ?I had Dr. Hilarie Fredrickson review with his recommendations as below.  We will try and get labs drawn this week so that if liver biopsy needs to be considered it could possibly done at the time of her gastric bypass. ? ?[place], ? ?Can you call the patient and let her know that we were alerted by her presurgical evaluation of elevated LFTs.  She needs further labs as below.  Please ask if she has started any new medications recently. ? ?Thanks, JL L ?----- Message ----- ?From: Jerene Bears, MD ?Sent: 04/15/2022  10:28 AM EDT ?To: Levin Erp, PA ?Subject: RE: Please review chart                       ? ?[name] ?That is a new elevation and a bit more transaminitis than I would expect with fatty liver alone. ?I agree that I am not convinced that should stop surgery but I would recommend further evaluation with labs and if they are negative possibly even a liver biopsy at the time of her gastric bypass surgery. ?Would investigate any new medications and check viral hepatitis serologies, ANA, IgG, AMA, ASMA, ferritin + IBC. ?JMP ? ? ?----- Message ----- ?From: Levin Erp, PA ?Sent: 04/13/2022   3:53 PM EDT ?To: Jerene Bears, MD ?Subject: Please review chart                           ? ?Dr. Hilarie Fredrickson, I was contacted by Lyndon Code, PA regarding this patient.  Apparently since I had her telemedicine visit in February she now has elevated LFTs.  Looking back at his CT shows fatty liver infiltration and enlarged liver.  They are asking if she needs further work-up prior to her scheduled gastric bypass on 5/9 or if she is okay to proceed.  I am not sure she really has a contradiction for gastric bypass, but wanted to get your take on it.  I told Ms. Ward that I would have you review as well. ? ?Thanks, JL L ? ? ? ?

## 2022-04-16 NOTE — Telephone Encounter (Signed)
FYI patient wanted to let you know that she was started on Mobic and Prednisone a month ago and has had 2 steroids shots in the past month as well.  ?

## 2022-04-16 NOTE — Telephone Encounter (Signed)
Patient informed. 

## 2022-04-16 NOTE — Anesthesia Preprocedure Evaluation (Addendum)
Anesthesia Evaluation  ?Patient identified by MRN, date of birth, ID band ?Patient awake ? ? ? ?Reviewed: ?Allergy & Precautions, NPO status , Patient's Chart, lab work & pertinent test results ? ?Airway ?Mallampati: II ? ?TM Distance: >3 FB ?Neck ROM: Full ? ? ? Dental ? ?(+) Teeth Intact, Caps, Dental Advisory Given, Missing ?  ?Pulmonary ?sleep apnea and Continuous Positive Airway Pressure Ventilation ,  ?  ?Pulmonary exam normal ?breath sounds clear to auscultation ? ? ? ? ? ? Cardiovascular ?hypertension, Pt. on medications ?Normal cardiovascular exam ?Rhythm:Regular Rate:Normal ? ? ?  ?Neuro/Psych ?PSYCHIATRIC DISORDERS ADDnegative neurological ROS ?   ? GI/Hepatic ?Neg liver ROS, GERD  ,  ?Endo/Other  ?Hypothyroidism Morbid obesity ? Renal/GU ?negative Renal ROS  ?negative genitourinary ?  ?Musculoskeletal ? ?(+) Arthritis , Osteoarthritis,   ? Abdominal ?(+) + obese,   ?Peds ? Hematology ?negative hematology ROS ?(+)   ?Anesthesia Other Findings ? ? Reproductive/Obstetrics ? ?  ? ? ? ? ? ? ? ? ? ? ? ? ? ?  ?  ? ? ? ? ? ?Anesthesia Physical ?Anesthesia Plan ? ?ASA: 3 ? ?Anesthesia Plan: General  ? ?Post-op Pain Management: Dilaudid IV and Precedex  ? ?Induction: Intravenous and Cricoid pressure planned ? ?PONV Risk Score and Plan: 4 or greater and Treatment may vary due to age or medical condition, Midazolam, Scopolamine patch - Pre-op and Ondansetron ? ?Airway Management Planned: Oral ETT ? ?Additional Equipment: None ? ?Intra-op Plan:  ? ?Post-operative Plan: Extubation in OR ? ?Informed Consent: I have reviewed the patients History and Physical, chart, labs and discussed the procedure including the risks, benefits and alternatives for the proposed anesthesia with the patient or authorized representative who has indicated his/her understanding and acceptance.  ? ? ? ?Dental advisory given ? ?Plan Discussed with: CRNA and Anesthesiologist ? ?Anesthesia Plan Comments: (See  PAT note 04/10/2022)  ? ? ? ? ?Anesthesia Quick Evaluation ? ?

## 2022-04-16 NOTE — Telephone Encounter (Signed)
Mobic can cause elevated LFTs (but typically < 2% risk) ?Avoid this for now if she can and we will await labs ordered yesterday ?JMP ? ?

## 2022-04-20 ENCOUNTER — Encounter (HOSPITAL_COMMUNITY): Payer: Self-pay | Admitting: General Surgery

## 2022-04-21 ENCOUNTER — Inpatient Hospital Stay (HOSPITAL_COMMUNITY): Payer: No Typology Code available for payment source | Admitting: Anesthesiology

## 2022-04-21 ENCOUNTER — Other Ambulatory Visit: Payer: Self-pay

## 2022-04-21 ENCOUNTER — Inpatient Hospital Stay (HOSPITAL_COMMUNITY)
Admission: RE | Admit: 2022-04-21 | Discharge: 2022-04-22 | DRG: 621 | Disposition: A | Discharge status: Home / Self Care | Payer: No Typology Code available for payment source | Source: Ambulatory Visit | Attending: General Surgery | Admitting: General Surgery

## 2022-04-21 ENCOUNTER — Inpatient Hospital Stay (HOSPITAL_COMMUNITY): Payer: No Typology Code available for payment source | Admitting: Physician Assistant

## 2022-04-21 ENCOUNTER — Encounter (HOSPITAL_COMMUNITY): Payer: Self-pay | Admitting: General Surgery

## 2022-04-21 ENCOUNTER — Encounter (HOSPITAL_COMMUNITY)
Admission: RE | Disposition: A | Discharge status: Home / Self Care | Payer: Self-pay | Source: Ambulatory Visit | Attending: General Surgery

## 2022-04-21 DIAGNOSIS — I10 Essential (primary) hypertension: Secondary | ICD-10-CM | POA: Diagnosis present

## 2022-04-21 DIAGNOSIS — Z6839 Body mass index (BMI) 39.0-39.9, adult: Secondary | ICD-10-CM | POA: Diagnosis not present

## 2022-04-21 DIAGNOSIS — E781 Pure hyperglyceridemia: Secondary | ICD-10-CM | POA: Diagnosis present

## 2022-04-21 DIAGNOSIS — D734 Cyst of spleen: Secondary | ICD-10-CM | POA: Diagnosis present

## 2022-04-21 DIAGNOSIS — E7800 Pure hypercholesterolemia, unspecified: Secondary | ICD-10-CM | POA: Diagnosis present

## 2022-04-21 DIAGNOSIS — G4733 Obstructive sleep apnea (adult) (pediatric): Secondary | ICD-10-CM | POA: Diagnosis present

## 2022-04-21 DIAGNOSIS — Z96652 Presence of left artificial knee joint: Secondary | ICD-10-CM | POA: Diagnosis present

## 2022-04-21 DIAGNOSIS — E119 Type 2 diabetes mellitus without complications: Secondary | ICD-10-CM | POA: Diagnosis present

## 2022-04-21 DIAGNOSIS — Z791 Long term (current) use of non-steroidal anti-inflammatories (NSAID): Secondary | ICD-10-CM | POA: Diagnosis not present

## 2022-04-21 DIAGNOSIS — K449 Diaphragmatic hernia without obstruction or gangrene: Secondary | ICD-10-CM | POA: Diagnosis present

## 2022-04-21 DIAGNOSIS — K760 Fatty (change of) liver, not elsewhere classified: Secondary | ICD-10-CM | POA: Diagnosis present

## 2022-04-21 DIAGNOSIS — Z9884 Bariatric surgery status: Principal | ICD-10-CM

## 2022-04-21 DIAGNOSIS — Z79899 Other long term (current) drug therapy: Secondary | ICD-10-CM | POA: Diagnosis not present

## 2022-04-21 DIAGNOSIS — N920 Excessive and frequent menstruation with regular cycle: Secondary | ICD-10-CM

## 2022-04-21 DIAGNOSIS — Z79890 Hormone replacement therapy (postmenopausal): Secondary | ICD-10-CM | POA: Diagnosis not present

## 2022-04-21 DIAGNOSIS — E039 Hypothyroidism, unspecified: Secondary | ICD-10-CM | POA: Diagnosis present

## 2022-04-21 HISTORY — PX: GASTRIC ROUX-EN-Y: SHX5262

## 2022-04-21 LAB — GLUCOSE, CAPILLARY
Glucose-Capillary: 160 mg/dL — ABNORMAL HIGH (ref 70–99)
Glucose-Capillary: 153 mg/dL — ABNORMAL HIGH (ref 70–99)
Glucose-Capillary: 171 mg/dL — ABNORMAL HIGH (ref 70–99)

## 2022-04-21 LAB — HEMOGLOBIN AND HEMATOCRIT, BLOOD
HCT: 41.7 % (ref 36.0–46.0)
Hemoglobin: 14.3 g/dL (ref 12.0–15.0)

## 2022-04-21 SURGERY — LAPAROSCOPIC ROUX-EN-Y GASTRIC BYPASS WITH UPPER ENDOSCOPY
Anesthesia: General

## 2022-04-21 MED ORDER — PANTOPRAZOLE SODIUM 40 MG IV SOLR
40.0000 mg | Freq: Every day | INTRAVENOUS | Status: DC
  Administered 2022-04-21: 40 mg via INTRAVENOUS
  Filled 2022-04-21: qty 10

## 2022-04-21 MED ORDER — MORPHINE SULFATE (PF) 2 MG/ML IV SOLN: 1.0000 mg | INTRAVENOUS | Status: DC | PRN

## 2022-04-21 MED ORDER — ENSURE MAX PROTEIN PO LIQD
2.0000 [oz_av] | ORAL | Status: DC
  Administered 2022-04-22 (×5): 2 [oz_av] via ORAL

## 2022-04-21 MED ORDER — LACTATED RINGERS IR SOLN
Status: DC | PRN
  Administered 2022-04-21: 1

## 2022-04-21 MED ORDER — PHENYLEPHRINE HCL-NACL 20-0.9 MG/250ML-% IV SOLN
INTRAVENOUS | Status: AC
  Filled 2022-04-21: qty 1000

## 2022-04-21 MED ORDER — PHENYLEPHRINE HCL-NACL 20-0.9 MG/250ML-% IV SOLN
INTRAVENOUS | Status: DC | PRN
  Administered 2022-04-21: 20 ug/min via INTRAVENOUS

## 2022-04-21 MED ORDER — PHENYLEPHRINE HCL (PRESSORS) 10 MG/ML IV SOLN
INTRAVENOUS | Status: AC
  Filled 2022-04-21: qty 1

## 2022-04-21 MED ORDER — SODIUM CHLORIDE (PF) 0.9 % IJ SOLN
INTRAMUSCULAR | Status: DC | PRN
  Administered 2022-04-21: 50 mL

## 2022-04-21 MED ORDER — HYDROCHLOROTHIAZIDE 25 MG PO TABS
25.0000 mg | ORAL_TABLET | Freq: Every day | ORAL | Status: DC
  Administered 2022-04-21: 25 mg via ORAL
  Filled 2022-04-21: qty 1

## 2022-04-21 MED ORDER — HYDROMORPHONE HCL 1 MG/ML IJ SOLN
INTRAMUSCULAR | Status: AC
  Filled 2022-04-21: qty 2

## 2022-04-21 MED ORDER — ENOXAPARIN SODIUM 30 MG/0.3ML IJ SOSY
30.0000 mg | PREFILLED_SYRINGE | Freq: Two times a day (BID) | INTRAMUSCULAR | Status: DC
  Administered 2022-04-21 – 2022-04-22 (×2): 30 mg via SUBCUTANEOUS
  Filled 2022-04-21 (×2): qty 0.3

## 2022-04-21 MED ORDER — SCOPOLAMINE 1 MG/3DAYS TD PT72: 1.0000 | MEDICATED_PATCH | TRANSDERMAL | Status: DC

## 2022-04-21 MED ORDER — SUGAMMADEX SODIUM 500 MG/5ML IV SOLN
INTRAVENOUS | Status: DC | PRN
  Administered 2022-04-21: 400 mg via INTRAVENOUS

## 2022-04-21 MED ORDER — PROCHLORPERAZINE EDISYLATE 10 MG/2ML IJ SOLN: 10.0000 mg | Freq: Four times a day (QID) | INTRAMUSCULAR | Status: DC | PRN

## 2022-04-21 MED ORDER — CHLORHEXIDINE GLUCONATE 0.12 % MT SOLN
15.0000 mL | Freq: Once | OROMUCOSAL | Status: AC
  Administered 2022-04-21: 15 mL via OROMUCOSAL

## 2022-04-21 MED ORDER — MIDAZOLAM HCL 5 MG/5ML IJ SOLN
INTRAMUSCULAR | Status: DC | PRN
  Administered 2022-04-21: 2 mg via INTRAVENOUS

## 2022-04-21 MED ORDER — SUGAMMADEX SODIUM 500 MG/5ML IV SOLN
INTRAVENOUS | Status: AC
  Filled 2022-04-21: qty 5

## 2022-04-21 MED ORDER — BUPIVACAINE LIPOSOME 1.3 % IJ SUSP
INTRAMUSCULAR | Status: DC | PRN
  Administered 2022-04-21: 20 mL

## 2022-04-21 MED ORDER — PROPOFOL 10 MG/ML IV BOLUS
INTRAVENOUS | Status: AC
  Filled 2022-04-21: qty 20

## 2022-04-21 MED ORDER — DEXAMETHASONE SODIUM PHOSPHATE 10 MG/ML IJ SOLN
INTRAMUSCULAR | Status: AC
  Filled 2022-04-21: qty 3

## 2022-04-21 MED ORDER — HYDROMORPHONE HCL 1 MG/ML IJ SOLN
0.2500 mg | INTRAMUSCULAR | Status: DC | PRN
  Administered 2022-04-21 (×4): 0.5 mg via INTRAVENOUS

## 2022-04-21 MED ORDER — ACETAMINOPHEN 500 MG PO TABS
1000.0000 mg | ORAL_TABLET | ORAL | Status: AC
  Administered 2022-04-21: 1000 mg via ORAL
  Filled 2022-04-21: qty 2

## 2022-04-21 MED ORDER — ACETAMINOPHEN 500 MG PO TABS
1000.0000 mg | ORAL_TABLET | Freq: Three times a day (TID) | ORAL | Status: DC
  Administered 2022-04-21 – 2022-04-22 (×4): 1000 mg via ORAL
  Filled 2022-04-21 (×4): qty 2

## 2022-04-21 MED ORDER — BUPIVACAINE LIPOSOME 1.3 % IJ SUSP
20.0000 mL | Freq: Once | INTRAMUSCULAR | Status: DC
Start: 2022-04-21 — End: 2022-04-21

## 2022-04-21 MED ORDER — "VISTASEAL 4 ML SINGLE DOSE KIT "
4.0000 mL | PACK | Freq: Once | CUTANEOUS | Status: DC
  Filled 2022-04-21: qty 4

## 2022-04-21 MED ORDER — ACETAMINOPHEN 160 MG/5ML PO SOLN: 1000.0000 mg | Freq: Three times a day (TID) | ORAL | Status: DC

## 2022-04-21 MED ORDER — EPHEDRINE SULFATE-NACL 50-0.9 MG/10ML-% IV SOSY
PREFILLED_SYRINGE | INTRAVENOUS | Status: DC | PRN
  Administered 2022-04-21: 5 mg via INTRAVENOUS
  Administered 2022-04-21: 10 mg via INTRAVENOUS

## 2022-04-21 MED ORDER — SODIUM CHLORIDE (PF) 0.9 % IJ SOLN
INTRAMUSCULAR | Status: AC
  Filled 2022-04-21: qty 50

## 2022-04-21 MED ORDER — KETOROLAC TROMETHAMINE 30 MG/ML IJ SOLN
INTRAMUSCULAR | Status: AC
  Filled 2022-04-21: qty 1

## 2022-04-21 MED ORDER — LIDOCAINE HCL (PF) 2 % IJ SOLN
INTRAMUSCULAR | Status: AC
  Filled 2022-04-21: qty 30

## 2022-04-21 MED ORDER — OXYCODONE HCL 5 MG PO TABS: 5.0000 mg | ORAL_TABLET | Freq: Once | ORAL | Status: DC | PRN

## 2022-04-21 MED ORDER — SIMETHICONE 80 MG PO CHEW
80.0000 mg | CHEWABLE_TABLET | Freq: Four times a day (QID) | ORAL | Status: DC | PRN
  Administered 2022-04-21 (×2): 80 mg via ORAL
  Filled 2022-04-21 (×3): qty 1

## 2022-04-21 MED ORDER — APREPITANT 40 MG PO CAPS
40.0000 mg | ORAL_CAPSULE | ORAL | Status: AC
  Administered 2022-04-21: 40 mg via ORAL
  Filled 2022-04-21: qty 1

## 2022-04-21 MED ORDER — ONDANSETRON HCL 4 MG/2ML IJ SOLN: 4.0000 mg | Freq: Once | INTRAMUSCULAR | Status: DC | PRN

## 2022-04-21 MED ORDER — MIDAZOLAM HCL 2 MG/2ML IJ SOLN
INTRAMUSCULAR | Status: AC
  Filled 2022-04-21: qty 2

## 2022-04-21 MED ORDER — ROCURONIUM BROMIDE 10 MG/ML (PF) SYRINGE
PREFILLED_SYRINGE | INTRAVENOUS | Status: DC | PRN
  Administered 2022-04-21 (×2): 20 mg via INTRAVENOUS
  Administered 2022-04-21: 80 mg via INTRAVENOUS

## 2022-04-21 MED ORDER — FENTANYL CITRATE (PF) 100 MCG/2ML IJ SOLN
INTRAMUSCULAR | Status: DC | PRN
  Administered 2022-04-21: 100 ug via INTRAVENOUS

## 2022-04-21 MED ORDER — ESCITALOPRAM OXALATE 20 MG PO TABS
10.0000 mg | ORAL_TABLET | Freq: Every day | ORAL | Status: DC
  Administered 2022-04-21: 10 mg via ORAL
  Filled 2022-04-21: qty 1

## 2022-04-21 MED ORDER — LACTATED RINGERS IV SOLN: INTRAVENOUS | Status: DC

## 2022-04-21 MED ORDER — "VISTASEAL 4 ML SINGLE DOSE KIT "
PACK | CUTANEOUS | Status: DC | PRN
  Administered 2022-04-21: 4 mL via TOPICAL

## 2022-04-21 MED ORDER — AMISULPRIDE (ANTIEMETIC) 5 MG/2ML IV SOLN: 10.0000 mg | Freq: Once | INTRAVENOUS | Status: DC | PRN

## 2022-04-21 MED ORDER — OXYCODONE HCL 5 MG/5ML PO SOLN: 5.0000 mg | Freq: Once | ORAL | Status: DC | PRN

## 2022-04-21 MED ORDER — FIBRIN SEALANT 2 ML SINGLE DOSE KIT
PACK | CUTANEOUS | Status: DC | PRN
  Administered 2022-04-21: 2 mL via TOPICAL

## 2022-04-21 MED ORDER — SUCCINYLCHOLINE CHLORIDE 200 MG/10ML IV SOSY
PREFILLED_SYRINGE | INTRAVENOUS | Status: AC
  Filled 2022-04-21: qty 10

## 2022-04-21 MED ORDER — CHLORHEXIDINE GLUCONATE 4 % EX LIQD: 60.0000 mL | Freq: Once | CUTANEOUS | Status: DC

## 2022-04-21 MED ORDER — HYDRALAZINE HCL 20 MG/ML IJ SOLN: 10.0000 mg | INTRAMUSCULAR | Status: DC | PRN

## 2022-04-21 MED ORDER — KETAMINE HCL 10 MG/ML IJ SOLN
INTRAMUSCULAR | Status: AC
  Filled 2022-04-21: qty 1

## 2022-04-21 MED ORDER — SCOPOLAMINE 1 MG/3DAYS TD PT72
1.0000 | MEDICATED_PATCH | TRANSDERMAL | Status: DC
  Administered 2022-04-21: 1.5 mg via TRANSDERMAL
  Filled 2022-04-21: qty 1

## 2022-04-21 MED ORDER — EPHEDRINE 5 MG/ML INJ
INTRAVENOUS | Status: AC
  Filled 2022-04-21: qty 5

## 2022-04-21 MED ORDER — FIBRIN SEALANT 2 ML SINGLE DOSE KIT
2.0000 mL | PACK | Freq: Once | CUTANEOUS | Status: DC
Start: 2022-04-21 — End: 2022-04-21
  Filled 2022-04-21: qty 2

## 2022-04-21 MED ORDER — ORAL CARE MOUTH RINSE: 15.0000 mL | Freq: Once | OROMUCOSAL | Status: AC

## 2022-04-21 MED ORDER — PROPOFOL 10 MG/ML IV BOLUS
INTRAVENOUS | Status: DC | PRN
  Administered 2022-04-21: 150 mg via INTRAVENOUS

## 2022-04-21 MED ORDER — ONDANSETRON HCL 4 MG/2ML IJ SOLN: 4.0000 mg | Freq: Four times a day (QID) | INTRAMUSCULAR | Status: DC | PRN

## 2022-04-21 MED ORDER — FENTANYL CITRATE (PF) 100 MCG/2ML IJ SOLN
INTRAMUSCULAR | Status: AC
  Filled 2022-04-21: qty 2

## 2022-04-21 MED ORDER — KETAMINE HCL 10 MG/ML IJ SOLN
INTRAMUSCULAR | Status: DC | PRN
  Administered 2022-04-21: 20 mg via INTRAVENOUS

## 2022-04-21 MED ORDER — POTASSIUM CHLORIDE IN NACL 20-0.45 MEQ/L-% IV SOLN
INTRAVENOUS | Status: DC
  Filled 2022-04-21 (×4): qty 1000

## 2022-04-21 MED ORDER — VALSARTAN-HYDROCHLOROTHIAZIDE 320-25 MG PO TABS: 1.0000 | ORAL_TABLET | Freq: Every day | ORAL | Status: DC

## 2022-04-21 MED ORDER — SODIUM CHLORIDE 0.9 % IV SOLN
2.0000 g | INTRAVENOUS | Status: AC
  Administered 2022-04-21: 2 g via INTRAVENOUS
  Filled 2022-04-21: qty 2

## 2022-04-21 MED ORDER — HEPARIN SODIUM (PORCINE) 5000 UNIT/ML IJ SOLN
5000.0000 [IU] | INTRAMUSCULAR | Status: AC
  Administered 2022-04-21: 5000 [IU] via SUBCUTANEOUS
  Filled 2022-04-21: qty 1

## 2022-04-21 MED ORDER — ONDANSETRON HCL 4 MG/2ML IJ SOLN
INTRAMUSCULAR | Status: DC | PRN
  Administered 2022-04-21: 4 mg via INTRAVENOUS

## 2022-04-21 MED ORDER — DEXAMETHASONE SODIUM PHOSPHATE 10 MG/ML IJ SOLN
INTRAMUSCULAR | Status: DC | PRN
Start: 2022-04-21 — End: 2022-04-21
  Administered 2022-04-21: 10 mg via INTRAVENOUS

## 2022-04-21 MED ORDER — INSULIN ASPART 100 UNIT/ML IJ SOLN
0.0000 [IU] | INTRAMUSCULAR | Status: DC
  Administered 2022-04-21 (×3): 3 [IU] via SUBCUTANEOUS
  Administered 2022-04-22 (×3): 2 [IU] via SUBCUTANEOUS

## 2022-04-21 MED ORDER — LIDOCAINE 2% (20 MG/ML) 5 ML SYRINGE
INTRAMUSCULAR | Status: DC | PRN
  Administered 2022-04-21: 100 mg via INTRAVENOUS

## 2022-04-21 MED ORDER — OXYCODONE HCL 5 MG/5ML PO SOLN
5.0000 mg | Freq: Four times a day (QID) | ORAL | Status: DC | PRN
  Administered 2022-04-22: 5 mg via ORAL
  Filled 2022-04-21: qty 5

## 2022-04-21 MED ORDER — ARTIFICIAL TEARS OPHTHALMIC OINT
TOPICAL_OINTMENT | OPHTHALMIC | Status: AC
  Filled 2022-04-21: qty 10.5

## 2022-04-21 MED ORDER — DEXAMETHASONE SODIUM PHOSPHATE 4 MG/ML IJ SOLN: 4.0000 mg | INTRAMUSCULAR | Status: DC

## 2022-04-21 MED ORDER — ONDANSETRON HCL 4 MG/2ML IJ SOLN
INTRAMUSCULAR | Status: AC
  Filled 2022-04-21: qty 6

## 2022-04-21 MED ORDER — ROCURONIUM BROMIDE 10 MG/ML (PF) SYRINGE
PREFILLED_SYRINGE | INTRAVENOUS | Status: AC
  Filled 2022-04-21: qty 30

## 2022-04-21 MED ORDER — BUPIVACAINE LIPOSOME 1.3 % IJ SUSP
INTRAMUSCULAR | Status: AC
  Filled 2022-04-21: qty 20

## 2022-04-21 MED ORDER — LEVOTHYROXINE SODIUM 75 MCG PO TABS
150.0000 ug | ORAL_TABLET | Freq: Every day | ORAL | Status: DC
  Administered 2022-04-22: 150 ug via ORAL
  Filled 2022-04-21: qty 2

## 2022-04-21 MED ORDER — IRBESARTAN 150 MG PO TABS
300.0000 mg | ORAL_TABLET | Freq: Every day | ORAL | Status: DC
  Administered 2022-04-21: 300 mg via ORAL
  Filled 2022-04-21: qty 2

## 2022-04-21 SURGICAL SUPPLY — 84 items
APPLICATOR COTTON TIP 6 STRL (MISCELLANEOUS) IMPLANT
APPLICATOR COTTON TIP 6IN STRL (MISCELLANEOUS)
APPLICATOR VISTASEAL 35 (MISCELLANEOUS) ×4 IMPLANT
APPLIER CLIP ROT 13.4 12 LRG (CLIP)
BAG COUNTER SPONGE SURGICOUNT (BAG) IMPLANT
BLADE SURG SZ11 CARB STEEL (BLADE) ×2 IMPLANT
CABLE HIGH FREQUENCY MONO STRZ (ELECTRODE) IMPLANT
CHLORAPREP W/TINT 26 (MISCELLANEOUS) ×4 IMPLANT
CLIP APPLIE ROT 13.4 12 LRG (CLIP) IMPLANT
CLIP SUT LAPRA TY ABSORB (SUTURE) ×4 IMPLANT
CUTTER FLEX LINEAR 45M (STAPLE) IMPLANT
DEVICE SUT QUICK LOAD TK 5 (STAPLE) ×2 IMPLANT
DEVICE SUT TI-KNOT TK 5X26 (MISCELLANEOUS) ×1 IMPLANT
DEVICE SUTURE ENDOST 10MM (ENDOMECHANICALS) ×2 IMPLANT
DRAIN PENROSE 0.25X18 (DRAIN) ×2 IMPLANT
DRSG TEGADERM 2-3/8X2-3/4 SM (GAUZE/BANDAGES/DRESSINGS) ×7 IMPLANT
ELECT REM PT RETURN 15FT ADLT (MISCELLANEOUS) ×2 IMPLANT
GAUZE 4X4 16PLY ~~LOC~~+RFID DBL (SPONGE) ×2 IMPLANT
GAUZE SPONGE 2X2 8PLY STRL LF (GAUZE/BANDAGES/DRESSINGS) ×1 IMPLANT
GAUZE SPONGE 4X4 12PLY STRL (GAUZE/BANDAGES/DRESSINGS) IMPLANT
GLOVE BIO SURGEON STRL SZ7.5 (GLOVE) IMPLANT
GLOVE BIOGEL PI IND STRL 8 (GLOVE) ×1 IMPLANT
GLOVE BIOGEL PI INDICATOR 8 (GLOVE) ×1
GLOVE BIOGEL PI ORTHO PRO 7.5 (GLOVE) ×1
GLOVE PI ORTHO PRO STRL 7.5 (GLOVE) ×1 IMPLANT
GOWN STRL REUS W/ TWL XL LVL3 (GOWN DISPOSABLE) ×4 IMPLANT
GOWN STRL REUS W/TWL XL LVL3 (GOWN DISPOSABLE) ×8
IRRIG SUCT STRYKERFLOW 2 WTIP (MISCELLANEOUS) ×2
IRRIGATION SUCT STRKRFLW 2 WTP (MISCELLANEOUS) ×1 IMPLANT
KIT BASIN OR (CUSTOM PROCEDURE TRAY) ×2 IMPLANT
KIT GASTRIC LAVAGE 34FR ADT (SET/KITS/TRAYS/PACK) ×2 IMPLANT
KIT TURNOVER KIT A (KITS) IMPLANT
MARKER SKIN DUAL TIP RULER LAB (MISCELLANEOUS) ×2 IMPLANT
MAT PREVALON FULL STRYKER (MISCELLANEOUS) ×2 IMPLANT
NDL SPNL 22GX3.5 QUINCKE BK (NEEDLE) ×1 IMPLANT
NEEDLE SPNL 22GX3.5 QUINCKE BK (NEEDLE) ×2 IMPLANT
PACK CARDIOVASCULAR III (CUSTOM PROCEDURE TRAY) ×2 IMPLANT
PENCIL SMOKE EVACUATOR (MISCELLANEOUS) IMPLANT
RELOAD 45 VASCULAR/THIN (ENDOMECHANICALS) IMPLANT
RELOAD ENDO STITCH 2.0 (ENDOMECHANICALS) ×18
RELOAD STAPLE 45 2.5 WHT GRN (ENDOMECHANICALS) IMPLANT
RELOAD STAPLE 45 3.5 BLU ETS (ENDOMECHANICALS) IMPLANT
RELOAD STAPLE 60 2.6 WHT THN (STAPLE) ×2 IMPLANT
RELOAD STAPLE 60 3.6 BLU REG (STAPLE) ×2 IMPLANT
RELOAD STAPLE 60 3.8 GOLD REG (STAPLE) ×1 IMPLANT
RELOAD STAPLE TA45 3.5 REG BLU (ENDOMECHANICALS) IMPLANT
RELOAD STAPLER BLUE 60MM (STAPLE) ×3 IMPLANT
RELOAD STAPLER GOLD 60MM (STAPLE) ×1 IMPLANT
RELOAD STAPLER WHITE 60MM (STAPLE) ×2 IMPLANT
RELOAD SUT SNGL STCH ABSRB 2-0 (ENDOMECHANICALS) ×5 IMPLANT
RELOAD SUT SNGL STCH BLK 2-0 (ENDOMECHANICALS) ×4 IMPLANT
SCISSORS LAP 5X45 EPIX DISP (ENDOMECHANICALS) ×2 IMPLANT
SET TUBE SMOKE EVAC HIGH FLOW (TUBING) ×2 IMPLANT
SHEARS HARMONIC ACE PLUS 45CM (MISCELLANEOUS) ×2 IMPLANT
SLEEVE XCEL OPT CAN 5 100 (ENDOMECHANICALS) ×2 IMPLANT
SOL ANTI FOG 6CC (MISCELLANEOUS) ×1 IMPLANT
SOLUTION ANTI FOG 6CC (MISCELLANEOUS) ×1
SPONGE GAUZE 2X2 8PLY STRL LF (GAUZE/BANDAGES/DRESSINGS) ×1 IMPLANT
SPONGE GAUZE 2X2 STER 10/PKG (GAUZE/BANDAGES/DRESSINGS) ×1
STAPLE LINE REINFORCEMENT LAP (STAPLE) ×2 IMPLANT
STAPLER ECHELON BIOABSB 60 FLE (MISCELLANEOUS) IMPLANT
STAPLER ECHELON LONG 60 440 (INSTRUMENTS) ×2 IMPLANT
STAPLER RELOAD BLUE 60MM (STAPLE) ×6
STAPLER RELOAD GOLD 60MM (STAPLE) ×2
STAPLER RELOAD WHITE 60MM (STAPLE) ×4
STRIP CLOSURE SKIN 1/2X4 (GAUZE/BANDAGES/DRESSINGS) ×2 IMPLANT
SURGILUBE 2OZ TUBE FLIPTOP (MISCELLANEOUS) ×2 IMPLANT
SUT MNCRL AB 4-0 PS2 18 (SUTURE) ×2 IMPLANT
SUT RELOAD ENDO STITCH 2 48X1 (ENDOMECHANICALS) ×5
SUT RELOAD ENDO STITCH 2.0 (ENDOMECHANICALS) ×4
SUT SURGIDAC NAB ES-9 0 48 120 (SUTURE) ×2 IMPLANT
SUT VIC AB 2-0 SH 27 (SUTURE) ×2
SUT VIC AB 2-0 SH 27X BRD (SUTURE) ×1 IMPLANT
SUTURE RELOAD END STTCH 2 48X1 (ENDOMECHANICALS) ×5 IMPLANT
SUTURE RELOAD ENDO STITCH 2.0 (ENDOMECHANICALS) ×4 IMPLANT
SYR 20ML LL LF (SYRINGE) ×4 IMPLANT
TAPE STRIPS DRAPE STRL (GAUZE/BANDAGES/DRESSINGS) ×1 IMPLANT
TOWEL OR 17X26 10 PK STRL BLUE (TOWEL DISPOSABLE) ×2 IMPLANT
TOWEL OR NON WOVEN STRL DISP B (DISPOSABLE) ×2 IMPLANT
TRAY FOLEY MTR SLVR 16FR STAT (SET/KITS/TRAYS/PACK) IMPLANT
TROCAR BLADELESS OPT 5 100 (ENDOMECHANICALS) ×2 IMPLANT
TROCAR UNIVERSAL OPT 12M 100M (ENDOMECHANICALS) ×6 IMPLANT
TROCAR XCEL 12X100 BLDLESS (ENDOMECHANICALS) ×2 IMPLANT
TUBING CONNECTING 10 (TUBING) ×4 IMPLANT

## 2022-04-21 NOTE — Progress Notes (Signed)
PHARMACY CONSULT FOR:  Risk Assessment for Post-Discharge VTE Following Bariatric Surgery ? ?Post-Discharge VTE Risk Assessment: ?This patient's probability of 30-day post-discharge VTE is increased due to the factors marked: ? Sleeve gastrectomy  ?x Liver disorder (transplant, cirrhosis, or nonalcoholic steatohepatitis)  ? Hx of VTE  ? Hemorrhage requiring transfusion  ? GI perforation, leak, or obstruction  ? ====================================================  ?  Female  ?  Age >/=60 years  ?  BMI >/=50 kg/m2  ?  CHF  ?  Dyspnea at Rest  ?  Paraplegia  ? x Non-gastric-band surgery  ?  Operation Time >/=3 hr  ?  Return to OR   ?  Length of Stay >/= 3 d  ? Hypercoagulable condition  ? Significant venous stasis  ? ? ?Predicted probability of 30-day post-discharge VTE: 0.16% ? ?Other patient-specific factors to consider: Has not been here for full 3 days, recheck for new risk factors on 04/24/22. Continue to monitor LFTs.  ? ? ?Recommendation for Discharge: ?No pharmacologic prophylaxis post-discharge ?Follow daily and recalculate estimated 30d VTE risk if returns to OR or LOS reaches 3 days. ? ? ?[name] is a 51 y.o. female who underwent Roux En Y surgery on 04/21/22. ?  ?Case start: 04/21/22 ?Case end: 04/24/22 or upon discharge ? ? ?No Known Allergies ? ?Patient Measurements: ?Height: '5\' 6"'$  (167.6 cm) ?Weight: 108.4 kg (239 lb) ?IBW/kg (Calculated) : 59.3 ?Body mass index is 38.58 kg/m?. ? ?No results for input(s): WBC, HGB, HCT, PLT, APTT, CREATININE, LABCREA, CREATININE, CREAT24HRUR, MG, PHOS, ALBUMIN, PROT, ALBUMIN, AST, ALT, ALKPHOS, BILITOT, BILIDIR, IBILI in the last 72 hours. ?Estimated Creatinine Clearance: 73.2 mL/min (A) (by C-G formula based on SCr of 1.12 mg/dL (H)). ? ?Past Medical History:  ?Diagnosis Date  ? Abnormal uterine bleeding (AUB) 10/11/2015  ? ADD (attention deficit disorder)   ? Arthritis   ? oa  ? COVID feb 16th or feb 17th  ? 01-29-21 or 2-17-2022rapid home test cold symptoms x 4  days all symptoms resolved  ? COVID 04/04/2021  ? epic, asymptomatic  ? Elevated TSH since jan 2022  ? thryoid med adjusted see dr Loanne Drilling 05-02-2021  ? Fatty liver   ? no problems in several yrs as of 04-02-21 per pt  ? GERD (gastroesophageal reflux disease)   ? Heavy periods   ? Hypertension   ? Hypothyroidism   ? Sleep apnea   ? cpap  ? Thyroid nodule   ? US done 2013 sees dr Ebony Hail may 20th 2022 for not sure which side no swallowing problems  ? Vitamin D deficiency   ? ? ?Medications Prior to Admission  ?Medication Sig Dispense Refill Last Dose  ? Cholecalciferol (VITAMIN D3 PO) Take 2,000 Units by mouth daily.   04/20/2022  ? cyclobenzaprine (FLEXERIL) 10 MG tablet Take 1 tablet by mouth once daily as needed. (Patient taking differently: Take 10 mg by mouth at bedtime.) 90 tablet 1 04/20/2022  ? escitalopram (LEXAPRO) 10 MG tablet Take 1 tablet (10 mg total) by mouth every morning. (Patient taking differently: Take 10 mg by mouth at bedtime.) 90 tablet 4 04/20/2022  ? estradiol (VIVELLE-DOT) 0.075 MG/24HR Place 1 patch onto the skin 2 (two) times a week. 24 patch 3 Past Month  ? fluticasone (FLONASE) 50 MCG/ACT nasal spray Place 1-2 sprays into both nostrils daily as needed for allergies.   Past Week  ? levothyroxine (SYNTHROID) 150 MCG tablet Take 1 tablet (150 mcg total) by mouth daily before breakfast. 60  tablet 12 04/20/2022  ? Multiple Vitamins-Minerals (BARIATRIC FUSION PO) Take 2 tablets by mouth daily.   04/20/2022  ? valsartan-hydrochlorothiazide (DIOVAN-HCT) 320-25 MG tablet Take 1 tablet by mouth daily. (Patient taking differently: Take 1 tablet by mouth at bedtime.) 90 tablet 4 04/20/2022  ? acetaminophen (TYLENOL) 500 MG tablet Take 1,000 mg by mouth every 6 (six) hours as needed (for pain.).   More than a month  ? diphenoxylate-atropine (LOMOTIL) 2.5-0.025 MG tablet Take 1 tablet by mouth every 6 hours as needed. (Patient not taking: Reported on 01/19/2022) 15 tablet 0 Not Taking  ? ibuprofen (ADVIL) 400 MG  tablet Take 1 tablet (400 mg total) by mouth every 8 (eight) hours as needed for up to 30 doses. (Patient not taking: Reported on 04/07/2022) 30 tablet 0 Not Taking  ? meloxicam (MOBIC) 15 MG tablet Take 1 tablet (15 mg total) by mouth daily. (Patient not taking: Reported on 04/07/2022) 30 tablet 1 Not Taking  ? methylPREDNISolone (MEDROL DOSEPAK) 4 MG TBPK tablet 6 day dose pack - take as directed (Patient not taking: Reported on 04/07/2022) 21 tablet 0 Not Taking  ? ? ? ?Rickey Barbara, PharmD Candidate ?04/21/2022, 12:19 PM ?  ?

## 2022-04-21 NOTE — Op Note (Signed)
[name] ?[id number] ?[birth year]. ?04/21/2022 ? ?Preoperative diagnosis:  ?Severe obesity (BMI 39) ? ?Splenic cyst ? ?Hepatic steatosis ? ?Hypothyroidism, unspecified type ? ?OSA on CPAP ? ?Essential hypertension ? ?Hypercholesterolemia with hypertriglyceridemia ? ?Low HDL (under 40) ? ?Elevated transaminase level ? 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 ?Postoperative  diagnosis:  ?1. Same + sliding hiatal hernia ? ?Surgical procedure: Laparoscopic Roux-en-Y gastric bypass (ante-colic, ante-gastric) with sliding hiatal hernia repair; upper endoscopy ? ?Surgeon: Gayland Curry, M.D. FACS ? ?Asst.: Romana Juniper MD FACS ? ?Anesthesia: General plus exparel/marcaine mix ? ?Complications: None  ? ?EBL: Minimal  ? ?Drains: None  ? ?Disposition: PACU in good condition  ? ?Indications for procedure: 51 y.o. yo female with morbid obesity who has been unsuccessful at sustained weight loss. The patient's comorbidities are listed above. We discussed the risk and benefits of surgery including but not limited to anesthesia risk, bleeding, infection, blood clot formation, anastomotic leak, anastomotic stricture, ulcer formation, death, respiratory complications, intestinal blockage, internal hernia, gallstone formation, vitamin and nutritional deficiencies, injury to surrounding structures, failure to lose weight and mood changes.  ? ?Description of procedure: Patient is brought to the operating room and general anesthesia induced. The patient had received preoperative broad-spectrum IV antibiotics and subcutaneous heparin. The abdomen was widely sterilely prepped with Chloraprep and draped. Patient timeout was performed and correct patient and procedure confirmed. Access was obtained with a 12 mm Optiview trocar in the left upper quadrant and pneumoperitoneum established without difficulty. Under direct vision 12 mm trocars were placed laterally in the right upper quadrant, right upper quadrant midclavicular line,  and to the left and above the umbilicus for the camera port. A 5 mm trocar was placed laterally in the left upper quadrant.  Exparel/marcaine mix was infiltrated in bilateral lateral abdominal walls as a TAP block for postoperative pain relief.  The omentum was brought into the upper abdomen and the transverse mesocolon elevated and the ligament of Treitz clearly identified. A 50 cm biliopancreatic limb was then carefully measured from the ligament of Treitz. The small intestine was divided at this point with a single firing of the white load linear stapler. A Penrose drain was sutured to the end of the Roux-en-Y limb for later identification. A 100 cm Roux-en-Y limb was then carefully measured. At this point a side-to-side anastomosis was created between the Roux limb and the end of the biliopancreatic limb. This was accomplished with a single firing of the 60 mm white load linear stapler. The common enterotomy was closed with a running 2-0 Vicryl begun at either end of the enterotomy and tied centrally. Vistaseal tissue sealant was placed over the anastomosis. The mesenteric defect was then closed with running 2-0 silk. The omentum was then divided with the harmonic scalpel up towards the transverse colon to allow mobility of the Roux limb toward the gastric pouch. The patient was then placed in steep reversed Trendelenburg. Through a 5 mm subxiphoid site the [facility] retractor was placed and the left lobe of the liver elevated with excellent exposure of the upper stomach and hiatus.  Patient had hepatic steatosis.  The known splenic cyst was identified.  It appeared calcified.  There appeared to be a plane between the greater curvature of the stomach and the splenic cyst.  There also appeared to be a small dimple at the hiatus consistent with a small sliding hiatal hernia.  The angle of Hiss was then mobilized with the harmonic scalpel. A 5 cm  gastric pouch was then carefully measured along the lesser curve of  the stomach. Dissection was carried along the lesser curve at this point with the Harmonic scalpel working carefully back toward the lesser sac at right angles to the lesser curve. The free lesser sac was then entered. After being sure all tubes were removed from the stomach an initial firing of the gold load 60 mm linear stapler was fired at right angles across the lesser curve for about 4 cm. The gastric pouch was further mobilized posteriorly and then the pouch was completed with 3 further firings of the 60 mm blue load linear stapler up through the previously dissected angle of His (the last 2 fires had staple line reinforcement with Ethicon staple line reinforcement). it was ensured that the pouch was completely mobilized away from the gastric remnant. This created a nice tubular 5 cm gastric pouch.  ? ?Because of the concern of a small sliding hiatal hernia we incised the gastropathic ligament with harmonic scalpel.  The right crus of the diaphragm was identified.  I gently incised the peritoneum slightly medial to the right crus and then using blunt dissection I identified the left crus.  There was a small sliding hiatal hernia.  The left and right crura were reapproximated with a single 0 Ethibond Endo Stitch secured with a titanium tie knot. ? ?The Roux limb was then brought up in an antecolic fashion with the candycane facing to the patient's left without undue tension. The gastrojejunostomy was created with an initial posterior row of 2-0 Vicryl between the Roux limb and the staple line of the gastric pouch. Enterotomies were then made in the gastric pouch and the Roux limb with the harmonic scalpel and at approximately 2-2-1/2 cm anastomosis was created with a single firing of the 28m blue load linear stapler. The staple line was inspected and was intact without bleeding. The common enterotomy was then closed with running 2-0 Vicryl begun at either end and tied centrally. The Ewall tube was then easily  passed through the anastomosis and an outer anterior layer of running 2-0 Vicryl was placed. The Ewald tube was removed. With the outlet of the gastrojejunostomy clamped and under saline irrigation the assistant performed upper endoscopy and with the gastric pouch tensely distended with air-there was no evidence of leak on this test. The pouch was desufflated. The PTerance Hartdefect was closed with running 2-0 silk. The abdomen was inspected for any evidence of bleeding or bowel injury and everything looked fine. The Nathanson retractor was removed under direct vision after coating the anastomosis with vistaseal tissue sealant. All CO2 was evacuated and trochars removed. Skin incisions were closed with 4-0 monocryl in a subcuticular fashion followed by steri-strips and bandages. Sponge needle and instrument counts were correct. The patient was taken to the PACU in good condition.   ? ?ELeighton Ruff WRedmond Pulling MD, FACS ?General, Bariatric, & Minimally Invasive Surgery ?[facility] [place]? ?

## 2022-04-21 NOTE — H&P (Signed)
?PROVIDER: Gabriellia Rempel Leanne Chang, MD ? ?MRN: [id number] ?DOB: 1971-03-28 ?DATE OF ENCOUNTER: 04/09/2022 ?Subjective  ? ?Chief Complaint: Pre-op Exam ? ? ?History of Present Illness: ?[name] is a 51 y.o. female who is seen today for long-term follow-up regarding her obesity and obesity related comorbidities are listed at the bottom of the note ? ?Denies any medical changes since I initially met her. She has decided to undergo Roux-en-Y gastric bypass. ? ?She ended up getting an upper GI which had a questionable Schatzki's ring. She underwent upper endoscopy which revealed no underlying lesion but she did get dilated. She states that she really could not tell any difference. She denies any dysphagia. She denies anything feeling like it gets stuck. No vomiting. ? ?She denies any abdominal pain, nausea or vomiting, diarrhea or constipation, chest pain, chest pressure ? ?She has been working on her liver shrinking diet and is already down a few pounds. ? ?Review of Systems: ?A complete review of systems was obtained from the patient. I have reviewed this information and discussed as appropriate with the patient. See HPI as well for other ROS. ? ?ROS  ? ?Medical History: ?Past Medical History:  ?Diagnosis Date  ? Anemia  ? Anxiety  ? Hypertension  ? Liver disease  ? Sleep apnea  ? Thyroid disease  ? ?There is no problem list on file for this patient. ? ?Past Surgical History:  ?Procedure Laterality Date  ? HYSTERECTOMY  ? TKR  ?left knee  ? ? ?No Known Allergies ? ?Current Outpatient Medications on File Prior to Visit  ?Medication Sig Dispense Refill  ? diphenoxylate-atropine (LOMOTIL) 2.5-0.025 mg tablet Take 1 tablet by mouth every 6 (six) hours as needed  ? cholecalciferol, vitamin D3, (CHOLECALCIFEROL, VIT D3,,BULK,) 100,000 unit/gram Powd Take by mouth  ? cyclobenzaprine (FLEXERIL) 10 MG tablet Take 1 tablet by mouth once daily as needed  ? escitalopram oxalate (LEXAPRO) 10 MG tablet Take 10 mg by mouth every  morning  ? estradiol (VIVELLE-DOT) patch 0.05 mg/24 hr Place onto the skin  ? ibuprofen (MOTRIN) 400 MG tablet Take by mouth  ? levothyroxine (SYNTHROID) 150 MCG tablet Take by mouth  ? meloxicam (MOBIC) 15 MG tablet  ? valsartan-hydroCHLOROthiazide (DIOVAN-HCT) 320-25 mg tablet  ? ?No current facility-administered medications on file prior to visit.  ? ?No family history on file.  ? ?Social History  ? ?Tobacco Use  ?Smoking Status Never  ?Smokeless Tobacco Never  ? ? ?Social History  ? ?Socioeconomic History  ? Marital status: Married  ?Tobacco Use  ? Smoking status: Never  ? Smokeless tobacco: Never  ?Substance and Sexual Activity  ? Alcohol use: Yes  ? Drug use: Never  ? ?Objective:  ? ?Vitals:  ?04/09/22 1110  ?BP: 120/76  ?Pulse: 104  ?Temp: 36.4 ?C (97.5 ?F)  ?SpO2: 96%  ?Weight: (!) 109.5 kg (241 lb 6 oz)  ?Height: 169.5 cm (5' 6.75")  ? ?Body mass index is 38.09 kg/m?. ? ?Last 3 Weights  ?04/09/2022 ?1110  ?01/09/2022  ? ? ?Weight: 109.5 kg (241 lb 6 oz)248 lb 3.2 oz  ? ? ?Gen: alert, NAD, non-toxic appearing, severe obesity ?Pupils: equal, no scleral icterus ?Pulm: Lungs clear to auscultation, symmetric chest rise ?CV: regular rate and rhythm ?Abd: soft, nontender, nondistended. No cellulitis. No incisional hernia ?Ext: no edema,  ?Skin: no rash, no jaundice ? ?Labs, Imaging and Diagnostic Testing: ? ?ugi 2/13- A swallowed 13 mm barium tablet did not pass beyond the level of the ?distal esophagus  despite a prolonged period of observation, and ?despite the patient drinking additional water and barium contrast. ?Findings may reflect the presence of a mild smooth distal esophageal ?stricture at this site. ?2/27- ct ?There is no evidence of intestinal obstruction or pneumoperitoneum. ?There is no hydronephrosis. ?There is 5.7 x 5.3 cm smooth marginated fluid density lesion with ?coarse rim of calcifications in the anterior aspect of the spleen ?with no significant interval change. There are no  internal ?septations or mural nodules. Findings suggest benign splenic cyst. ?Markedly enlarged fatty liver. Enlarged spleen. Few diverticula are ?seen in colon without signs of focal diverticulitis. ?evaluation. ? ?egd 3/23- Regular Z-line at 40 cm. A non-obstructing Schatzki ring was found at the gastroesophageal ?junction. A TTS dilator was passed through the scope. Dilation with a 16-17-18 mm balloon ?dilator was performed to 18 mm. The dilation site was examined and showed mild mucosal ?disruption. ?Findings: ?- Diffuse mild inflammation characterized by erythema and granularity was found in the ?gastric body. Biopsies were taken with a cold forceps for histology and Helicobacter pylori ?testing. ?- The cardia and gastric fundus were normal on retroflexion. ?- The examined duodenum was normal. ? ?Colonoscopy also reviewed ? ?Labs from February 6 also reviewed. Normal iron panel. Total cholesterol 217, HDL low at 32, triglycerides elevated at 275, LDL 143 globin A1c 6.5 normal TSH, comprehensive metabolic panel was essentially normal except for blood glucose of 119 and creatinine slightly elevated at 1.226, alkaline phosphatase 173, AST 112, ALT 88 ? ?Assessment and Plan:  ?Diagnoses and all orders for this visit: ? ?Severe obesity (CMS-HCC) ? ?Splenic cyst ? ?Hepatic steatosis ? ?Hypothyroidism, unspecified type ? ?OSA on CPAP ? ?Essential hypertension ? ?Hypercholesterolemia with hypertriglyceridemia ? ?Low HDL (under 40) ? ?Elevated transaminase level ? ?Diabetes mellitus type 2 in obese (CMS-HCC) ? ?Other orders ?- ORDER ? ? ? ?We reviewed her work-up and evaluation. She has received nutritional and psychological clearance. ? ?We reviewed her CT scan of her splenic cyst. Splenic cyst appears to be benign lesion. I discussed her case with some of her colleagues. I think there is a window between her stomach and her spleen in order to do a gastric bypass. However I did discuss with her that she has had a  slight increased risk for splenic injury and/or splenic injury given the size of her splenic cyst. We discussed that if it becomes emergent that we would likely have to convert to an open procedure. She understands that. However I think the likelihood that is low. ? ?We reviewed her LFTs and the findings of hepatic steatosis. We discussed the importance of the preoperative liver shrinking diet. ? ?She has attended her preop education class. We reviewed the typical hospitalization and the typical recovery. We discussed the typical things that we see after surgery. ? ?This patient encounter took 30 minutes today to perform the following: take history, perform exam, review outside records, interpret imaging, counsel the patient on their diagnosis and document encounter, findings & plan in the EHR ? ?No follow-ups on file. ? ?Emily Ruff. Redmond Pulling MD FACS ?General, Minimally Invasive, & Bariatric Surgery ?Electronically signed by Rudean Curt, MD at 04/09/2022 11:51 AM EDT ?

## 2022-04-21 NOTE — Progress Notes (Signed)

## 2022-04-21 NOTE — Transfer of Care (Signed)
Immediate Anesthesia Transfer of Care Note ? ?Patient: [name] ? ?Procedure(s) Performed: Procedure(s): ?LAPAROSCOPIC ROUX-EN-Y GASTRIC BYPASS WITH UPPER ENDOSCOPY SLIDING HIATAL HERNIA REPAIR (N/A) ? ?Patient Location: PACU ? ?Anesthesia Type:General ? ?Level of Consciousness:  sedated, patient cooperative and responds to stimulation ? ?Airway & Oxygen Therapy:Patient Spontanous Breathing and Patient connected to face mask oxgen ? ?Post-op Assessment:  Report given to PACU RN and Post -op Vital signs reviewed and stable ? ?Post vital signs:  Reviewed and stable ? ?Last Vitals:  ?Vitals:  ? 04/21/22 0532 04/21/22 1012  ?BP: 137/81 115/75  ?Pulse: 79 75  ?Resp: 16 15  ?Temp: (!) 36.3 ?C 36.7 ?C  ?SpO2: 98% 100%  ? ? ?Complications: No apparent anesthesia complications ? ?

## 2022-04-21 NOTE — Op Note (Signed)
Preoperative diagnosis: Roux-en-Y gastric bypass ? ?Postoperative diagnosis: Same  ? ?Procedure: Upper endoscopy  ? ?Surgeon: Clovis Riley, M.D. ? ?Anesthesia: Gen.  ? ?Description of procedure: The endoscope was placed in the mouth and oropharynx and under endoscopic vision it was advanced to the esophagogastric junction which was identified at 42cm from the teeth.  The pouch was tensely insufflated while the upper abdomen was flooded with irrigation to perform a leak test, which was negative. No bubbles were seen.  The staple line was hemostatic and the anastomosis was visibly patent, measuring 47cm from the teeth. The lumen was decompressed and the scope was withdrawn without difficulty.   ? ?Clovis Riley, M.D. ?General, Bariatric, & Minimally Invasive Surgery ?[facility] ? ? ?

## 2022-04-21 NOTE — Anesthesia Procedure Notes (Signed)
Procedure Name: Intubation ?Date/Time: 04/21/2022 7:45 AM ?Performed by: Lavina Hamman, CRNA ?Pre-anesthesia Checklist: Patient identified, Emergency Drugs available, Suction available, Patient being monitored and Timeout performed ?Patient Re-evaluated:Patient Re-evaluated prior to induction ?Oxygen Delivery Method: Circle system utilized ?Preoxygenation: Pre-oxygenation with 100% oxygen ?Induction Type: IV induction ?Ventilation: Mask ventilation without difficulty ?Laryngoscope Size: Mac and 4 ?Grade View: Grade II ?Tube type: Oral ?Tube size: 7.0 mm ?Number of attempts: 1 ?Airway Equipment and Method: Stylet ?Placement Confirmation: ETT inserted through vocal cords under direct vision, positive ETCO2, CO2 detector and breath sounds checked- equal and bilateral ?Secured at: 22 cm ?Tube secured with: Tape ?Dental Injury: Teeth and Oropharynx as per pre-operative assessment  ? ? ? ? ?

## 2022-04-21 NOTE — Anesthesia Postprocedure Evaluation (Signed)
Anesthesia Post Note ? ?Patient: TAEJAH OHALLORAN ? ?Procedure(s) Performed: LAPAROSCOPIC ROUX-EN-Y GASTRIC BYPASS WITH UPPER ENDOSCOPY SLIDING HIATAL HERNIA REPAIR ? ?  ? ?Patient location during evaluation: PACU ?Anesthesia Type: General ?Level of consciousness: awake and alert and oriented ?Pain management: pain level controlled ?Vital Signs Assessment: post-procedure vital signs reviewed and stable ?Respiratory status: spontaneous breathing, nonlabored ventilation and respiratory function stable ?Cardiovascular status: blood pressure returned to baseline and stable ?Postop Assessment: no apparent nausea or vomiting ?Anesthetic complications: no ? ? ?No notable events documented. ? ?Last Vitals:  ?Vitals:  ? 04/21/22 1015 04/21/22 1030  ?BP: 121/77 132/85  ?Pulse: 73 79  ?Resp: (!) 22 14  ?Temp:    ?SpO2: 100% 94%  ?  ?Last Pain:  ?Vitals:  ? 04/21/22 1030  ?TempSrc:   ?PainSc: 5   ? ? ?  ?  ?  ?  ?  ?  ? ?Kiah Vanalstine A. ? ? ? ? ?

## 2022-04-21 NOTE — Discharge Instructions (Signed)
GASTRIC BYPASS / SLEEVE  ?Home Care Instructions ? ?These instructions are to help you care for yourself when you go home. ? ?Call: If you have any problems. ?Call [phone] and ask for the surgeon on call ?If you have an emergency related to your surgery please use the ER at [place].  ?Tell the ER staff that you are a new post-op gastric bypass or gastric sleeve patient ?  ?Signs and symptoms to report: Severe vomiting or nausea ?If you cannot handle clear liquids for longer than 1 day, call your surgeon  ?Abdominal pain which does not get better after taking your pain medication ?Fever greater than 100.4? F and chills ?Heart rate over 100 beats a minute ?Trouble breathing ?Chest pain ? Redness, swelling, drainage, or foul odor at incision (surgical) sites ? If your incisions open or pull apart ?Swelling or pain in calf (lower leg) ?Diarrhea (Loose bowel movements that happen often), frequent watery, uncontrolled bowel movements ?Constipation, (no bowel movements for 3 days) if this happens:  ?Take Milk of Magnesia, 2 tablespoons by mouth, 3 times a day for 2 days if needed ?Stop taking Milk of Magnesia once you have had a bowel movement ?Call your doctor if constipation continues ?Or ?Take Miralax  (instead of Milk of Magnesia) following the label instructions ?Stop taking Miralax once you have had a bowel movement ?Call your doctor if constipation continues ?Anything you think is ?abnormal for you? ?  ?Normal side effects after surgery: Unable to sleep at night or unable to concentrate ?Irritability ?Being tearful (crying) or depressed ?These are common complaints, possibly related to your anesthesia, stress of surgery and change in lifestyle, that usually go away a few weeks after surgery.  If these feelings continue, call your medical doctor.  ?Wound Care: You may have surgical glue, steri-strips, or staples over your incisions after surgery ?Surgical glue:  Looks like a clear film over your incisions  and will wear off a little at a time ?Steri-strips : Adhesive strips of tape over your incisions. You may notice a yellowish color on the skin under the steri-strips. This is used to make the   steri-strips stick better. Do not pull the steri-strips off - let them fall off ?Staples: Staples may be removed before you leave the hospital ?If you go home with staples, call [facility] at for an appointment with your surgeon?s nurse to have staples removed 10 days after surgery, [phone] ?Showering: You may shower two (2) days after your surgery unless your surgeon tells you differently ?Wash gently around incisions with warm soapy water, rinse well, and gently pat dry  ?If you have a drain (tube from your incision), you may need someone to hold this while you shower  ?No tub baths until staples are removed and incisions are healed   ?  ?Medications: Medications should be liquid or crushed if larger than the size of a dime ?Extended release pills (medication that releases a little bit at a time through the day) should not be crushed ?Depending on the size and number of medications you take, you may need to space (take a few throughout the day)/change the time you take your medications so that you do not over-fill your pouch (smaller stomach) ?Make sure you follow-up with your primary care physician to make medication changes needed during rapid weight loss and life-style changes ?If you have diabetes, follow up with the doctor that orders your diabetes medication(s) within one week after surgery and check   your blood sugar regularly. ?Do not drive while taking narcotics (pain medications) ?DO NOT take NSAID'S (Examples of NSAID's include ibuprofen, naproxen)  ?Diet:                    First 2 Weeks ? You will see the nutritionist about two (2) weeks after your surgery. The nutritionist will increase the types of foods you can eat if you are handling liquids well: ?If you have severe vomiting or nausea  and cannot handle clear liquids lasting longer than 1 day, call your surgeon  ?Protein Shake ?Drink at least 2 ounces of shake 5-6 times per day ?Each serving of protein shakes (usually 8 - 12 ounces) should have a minimum of:  ?15 grams of protein  ?And no more than 5 grams of carbohydrate  ?Goal for protein each day: ?Men = 80 grams per day ?Women = 60 grams per day ?Protein powder may be added to fluids such as non-fat milk or Lactaid milk or Soy milk (limit to 35 grams added protein powder per serving) ? ?Hydration ?Slowly increase the amount of water and other clear liquids as tolerated (See Acceptable Fluids) ?Slowly increase the amount of protein shake as tolerated  ? Sip fluids slowly and throughout the day ?May use sugar substitutes in small amounts (no more than 6 - 8 packets per day; i.e. Splenda) ? ?Fluid Goal ?The first goal is to drink at least 8 ounces of protein shake/drink per day (or as directed by the nutritionist);  See handout from pre-op Bariatric Education Class for examples of protein shake/drink.   ?Slowly increase the amount of protein shake you drink as tolerated ?You may find it easier to slowly sip shakes throughout the day ?It is important to get your proteins in first ?Your fluid goal is to drink 64 - 100 ounces of fluid daily ?It may take a few weeks to build up to this ?32 oz (or more) should be clear liquids  ?And  ?32 oz (or more) should be full liquids (see below for examples) ?Liquids should not contain sugar, caffeine, or carbonation ? ?Clear Liquids: ?Water or Sugar-free flavored water (i.e. Fruit H2O, Propel) ?Decaffeinated coffee or tea (sugar-free) ?Anyiah [place] Lite, Wyler?s Lite, Minute Maid Lite ?Sugar-free Jell-O ?Bouillon or broth ?Sugar-free Popsicle:   *Less than 20 calories each; Limit 1 per day ? ?Full Liquids: ?Protein Shakes/Drinks + 2 choices per day of other full liquids ?Full liquids must be: ?No More Than 12 grams of Carbs per serving  ?No More Than 3 grams of Fat  per serving ?Strained low-fat cream soup ?Non-Fat milk ?Fat-free Lactaid Milk ?Sugar-free yogurt (Dannon Lite & Fit, Greek yogurt) ? ? ? ?  ?Vitamins and Minerals Start 1 day after surgery unless otherwise directed by your surgeon ?Bariatric Specific Complete Multivitamins ?Chewable Calcium Citrate with Vitamin D-3 ?(Example: 3 Chewable Calcium Plus 600 with Vitamin D-3) ?Take 500 mg three (3) times a day for a total of 1500 mg each day ?Do not take all 3 doses of calcium at one time as it may cause constipation, and you can only absorb 500 mg  at a time  ?Do not mix multivitamins containing iron with calcium supplements; take 2 hours apart ? ?Menstruating women and those at risk for anemia (a blood disease that causes weakness) may need extra iron ?Talk with your doctor to see if you need more iron ?If you need extra iron: Total daily Iron recommendation (including Vitamins) is 50 to 100   mg Iron/day ?Do not stop taking or change any vitamins or minerals until you talk to your nutritionist or surgeon ?Your nutritionist and/or surgeon must approve all vitamin and mineral supplements ?  ?Activity and Exercise: It is important to continue walking at home.  Limit your physical activity as instructed by your doctor.  During this time, use these guidelines: ?Do not lift anything greater than ten (10) pounds for at least two (2) weeks ?Do not go back to work or drive until your surgeon says you can ?You may have sex when you feel comfortable  ?It is VERY important for female patients to use a reliable birth control method; fertility often increases after surgery  ?Do not get pregnant for at least 18 months ?Start exercising as soon as your doctor tells you that you can ?Make sure your doctor approves any physical activity ?Start with a simple walking program ?Walk 5-15 minutes each day, 7 days per week.  ?Slowly increase until you are walking 30-45 minutes per day ?Consider joining our BELT program. [phone] or email  [email or web address] ?  ?Special Instructions Things to remember: ? ?Use your CPAP when sleeping if this applies to you, do not stop the use of CPAP unless directed by physician after a sleep study ?[place]

## 2022-04-21 NOTE — Progress Notes (Signed)
Pt has her home CPAP equipment at bedside.  Pt is not sure if she is going to wear it tonight or not but prefers self placement if she does.  Sterile water provided for humidification. ?

## 2022-04-21 NOTE — Interval H&P Note (Signed)
History and Physical Interval Note: ? ?04/21/2022 ?7:31 AM ? ?[name]  has presented today for surgery, with the diagnosis of morbid obesity.  The various methods of treatment have been discussed with the patient and family. After consideration of risks, benefits and other options for treatment, the patient has consented to  Procedure(s): ?LAPAROSCOPIC ROUX-EN-Y GASTRIC BYPASS WITH UPPER ENDOSCOPY (N/A) as a surgical intervention.  The patient's history has been reviewed, patient examined, no change in status, stable for surgery.  I have reviewed the patient's chart and labs.  Questions were answered to the patient's satisfaction.   ? ?Her liver labs are suggestive for perhaps SLE ? ?I think safe to proceed with bariatric surgery ?Greer Pickerel ? ? ?

## 2022-04-22 ENCOUNTER — Other Ambulatory Visit (HOSPITAL_COMMUNITY): Payer: Self-pay

## 2022-04-22 ENCOUNTER — Encounter (HOSPITAL_COMMUNITY): Payer: Self-pay | Admitting: General Surgery

## 2022-04-22 LAB — CBC WITH DIFFERENTIAL/PLATELET
Abs Immature Granulocytes: 0.04 10*3/uL (ref 0.00–0.07)
Basophils Absolute: 0 10*3/uL (ref 0.0–0.1)
Basophils Relative: 0 %
Eosinophils Absolute: 0 10*3/uL (ref 0.0–0.5)
Eosinophils Relative: 0 %
HCT: 36.7 % (ref 36.0–46.0)
Hemoglobin: 12.2 g/dL (ref 12.0–15.0)
Immature Granulocytes: 0 %
Lymphocytes Relative: 10 %
Lymphs Abs: 0.9 10*3/uL (ref 0.7–4.0)
MCH: 30.4 pg (ref 26.0–34.0)
MCHC: 33.2 g/dL (ref 30.0–36.0)
MCV: 91.5 fL (ref 80.0–100.0)
Monocytes Absolute: 0.6 10*3/uL (ref 0.1–1.0)
Monocytes Relative: 6 %
Neutro Abs: 7.7 10*3/uL (ref 1.7–7.7)
Neutrophils Relative %: 84 %
Platelets: 215 10*3/uL (ref 150–400)
RBC: 4.01 MIL/uL (ref 3.87–5.11)
RDW: 13.4 % (ref 11.5–15.5)
WBC: 9.2 10*3/uL (ref 4.0–10.5)
nRBC: 0 % (ref 0.0–0.2)

## 2022-04-22 LAB — COMPREHENSIVE METABOLIC PANEL
ALT: 107 U/L — ABNORMAL HIGH (ref 0–44)
AST: 82 U/L — ABNORMAL HIGH (ref 15–41)
Albumin: 4.1 g/dL (ref 3.5–5.0)
Alkaline Phosphatase: 111 U/L (ref 38–126)
Anion gap: 8 (ref 5–15)
BUN: 25 mg/dL — ABNORMAL HIGH (ref 6–20)
CO2: 22 mmol/L (ref 22–32)
Calcium: 8.7 mg/dL — ABNORMAL LOW (ref 8.9–10.3)
Chloride: 108 mmol/L (ref 98–111)
Creatinine, Ser: 0.9 mg/dL (ref 0.44–1.00)
GFR, Estimated: >60 mL/min (ref >60)
Glucose, Bld: 139 mg/dL — ABNORMAL HIGH (ref 70–99)
Potassium: 3.7 mmol/L (ref 3.5–5.1)
Sodium: 138 mmol/L (ref 135–145)
Total Bilirubin: 0.6 mg/dL (ref 0.3–1.2)
Total Protein: 7.1 g/dL (ref 6.5–8.1)

## 2022-04-22 LAB — ANTI-NUCLEAR AB-TITER (ANA TITER): ANA Titer 1: 1:40 {titer} — ABNORMAL HIGH

## 2022-04-22 LAB — GLUCOSE, CAPILLARY
Glucose-Capillary: 121 mg/dL — ABNORMAL HIGH (ref 70–99)
Glucose-Capillary: 127 mg/dL — ABNORMAL HIGH (ref 70–99)
Glucose-Capillary: 137 mg/dL — ABNORMAL HIGH (ref 70–99)
Glucose-Capillary: 96 mg/dL (ref 70–99)

## 2022-04-22 LAB — ANA: Anti Nuclear Antibody (ANA): POSITIVE — AB

## 2022-04-22 LAB — ANTI-SMOOTH MUSCLE ANTIBODY, IGG: Actin (Smooth Muscle) Antibody (IGG): <20 U (ref <20)

## 2022-04-22 LAB — IGG: IgG (Immunoglobin G), Serum: 942 mg/dL (ref 600–1640)

## 2022-04-22 LAB — MITOCHONDRIAL ANTIBODIES: Mitochondrial M2 Ab, IgG: <=20 U (ref <=20.0)

## 2022-04-22 LAB — HEPATITIS B SURFACE ANTIBODY,QUALITATIVE: Hep B S Ab: NONREACTIVE

## 2022-04-22 LAB — HEPATITIS C ANTIBODY
Hepatitis C Ab: NONREACTIVE
SIGNAL TO CUT-OFF: 0.11 (ref <1.00)

## 2022-04-22 LAB — HEPATITIS B SURFACE ANTIGEN: Hepatitis B Surface Ag: NONREACTIVE

## 2022-04-22 LAB — HEPATITIS B CORE ANTIBODY, TOTAL: Hep B Core Total Ab: NONREACTIVE

## 2022-04-22 LAB — HEPATITIS A ANTIBODY, TOTAL: Hepatitis A AB,Total: REACTIVE — AB

## 2022-04-22 MED ORDER — TRAMADOL HCL 50 MG PO TABS
50.0000 mg | ORAL_TABLET | Freq: Four times a day (QID) | ORAL | 0 refills | Status: DC | PRN
  Filled 2022-04-22: qty 10, 3d supply, fill #0

## 2022-04-22 MED ORDER — PANTOPRAZOLE SODIUM 40 MG PO TBEC
40.0000 mg | DELAYED_RELEASE_TABLET | Freq: Every day | ORAL | 0 refills | Status: DC
  Filled 2022-04-22: qty 90, 90d supply, fill #0

## 2022-04-22 MED ORDER — ACETAMINOPHEN 500 MG PO TABS: 1000.0000 mg | ORAL_TABLET | Freq: Three times a day (TID) | ORAL | 0 refills | Status: AC

## 2022-04-22 MED ORDER — ONDANSETRON 4 MG PO TBDP
4.0000 mg | ORAL_TABLET | Freq: Four times a day (QID) | ORAL | 0 refills | Status: DC | PRN
  Filled 2022-04-22: qty 20, 5d supply, fill #0

## 2022-04-22 NOTE — Progress Notes (Signed)
24hr fluid recall: 473m.  Per dehydration protocol, will call pt to f/u within one week postop. ?

## 2022-04-22 NOTE — Progress Notes (Signed)
Assessment unchanged. Pt verbalized understanding of dc instructions through teach back and all instructions by Madlyn Frankel, RN regarding medications and follow up care and when to call the doctor. Discharged to front entrance accompanied by husband and NT. ?

## 2022-04-22 NOTE — Progress Notes (Signed)
24 hour chart audit completed 

## 2022-04-22 NOTE — Plan of Care (Signed)

## 2022-04-22 NOTE — Discharge Summary (Signed)
Physician Discharge Summary  ?EMYLEE DECELLE UUV:[id number] DOB: 04/22/1971 DOA: 04/21/2022 ? ?PCP: Asencion Noble, MD ? ?Admit date: 04/21/2022 ?Discharge date: 04/22/22 ? ?Recommendations for Outpatient Follow-up:  ? ? ? Follow-up Information   ? ? Greer Pickerel, MD. Daphane Shepherd on 05/20/2022.   ?Specialty: General Surgery ?Why: at 9:30am.  Please arrive 15 minutes prior to your appointment time.  Thank you. ?Contact information: ?[address] ?[address] ?[place] [address] ?[phone] ? ? ?  ?  ? ? Maczis, Puja Gosai, PA-C. Go on 06/18/2022.   ?Specialty: General Surgery ?Why: at 9:15am for Dr. Redmond Pulling.  Please arrive 15 minutes prior to your appointment time.  Thank you. ?Contact information: ?[address] ?[address] ?[place] [address] ?[phone] ? ? ?  ?  ? ?  ?  ? ?  ? ?Discharge Diagnoses:  ?Principal Problem: ?  S/P gastric bypass ?Severe obesity (BMI 39) ? ?Splenic cyst ? ?Hepatic steatosis ? ?Hypothyroidism, unspecified type ? ?OSA on CPAP ? ?Essential hypertension ? ?Hypercholesterolemia with hypertriglyceridemia ? ?Low HDL (under 40) ? ?Elevated transaminase level ? 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 ?Surgical Procedure: Laparoscopic Roux-en-Y gastric bypass with sliding hiatal hernia repair, upper endoscopy ? ?Discharge Condition: Good ?Disposition: Home ? ?Diet recommendation: Postoperative gastric bypass diet ? ?Filed Weights  ? 04/21/22 0529  ?Weight: 108.4 kg  ? ? ? ?Hospital Course:  ?The patient was admitted for a planned laparoscopic Roux-en-Y gastric bypass. Please see operative note. Preoperatively the patient was given 5000 units of subcutaneous heparin for DVT prophylaxis. ERAS protocol was used. Postoperative prophylactic Lovenox dosing was started on the evening of postoperative day 0.  The patient was started on ice chips and water on the evening of POD 0 which they tolerated. On postoperative day 1 The patient's diet was advanced to protein shakes which they also tolerated. On POD 1, The  patient was ambulating without difficulty. Their vital signs are stable without fever or tachycardia. Their hemoglobin had remained stable. The patient was maintained on their home settings for CPAP therapy. The patient had received discharge instructions and counseling. They were deemed stable for discharge. ? ?BP (!) 122/94 (BP Location: Right Arm)   Pulse 76   Temp 97.7 ?F (36.5 ?C) (Oral)   Resp 14   Ht '5\' 6"'$  (1.676 m)   Wt 108.4 kg   LMP 03/14/2021   SpO2 98%   BMI 38.58 kg/m?  ? ?Gen: alert, NAD, non-toxic appearing ?Pupils: equal, no scleral icterus ?Pulm: Lungs clear to auscultation, symmetric chest rise ?CV: regular rate and rhythm ?Abd: soft, min tender, nondistended. No cellulitis. No incisional hernia ?Ext: no edema, no calf tenderness ?Skin: no rash, no jaundice ? ?Discharge Instructions ? ?Discharge Instructions   ? ? Ambulate hourly while awake   Complete by: As directed ?  ? Call MD for:  difficulty breathing, headache or visual disturbances   Complete by: As directed ?  ? Call MD for:  persistant dizziness or light-headedness   Complete by: As directed ?  ? Call MD for:  persistant nausea and vomiting   Complete by: As directed ?  ? Call MD for:  redness, tenderness, or signs of infection (pain, swelling, redness, odor or green/yellow discharge around incision site)   Complete by: As directed ?  ? Call MD for:  severe uncontrolled pain   Complete by: As directed ?  ? Call MD for:  temperature >101 F   Complete by: As directed ?  ?  Diet bariatric full liquid   Complete by: As directed ?  ? Discharge instructions   Complete by: As directed ?  ? See bariatric discharge instructions  ? Incentive spirometry   Complete by: As directed ?  ? Perform hourly while awake  ? ?  ? ?Allergies as of 04/22/2022   ?No Known Allergies ?  ? ?  ?Medication List  ?  ? ?STOP taking these medications   ? ?diphenoxylate-atropine 2.5-0.025 MG tablet ?Commonly known as: LOMOTIL ?  ?Dotti 0.075 MG/24HR ?Generic drug:  estradiol ?  ?ibuprofen 400 MG tablet ?Commonly known as: ADVIL ?  ?meloxicam 15 MG tablet ?Commonly known as: MOBIC ?  ?methylPREDNISolone 4 MG Tbpk tablet ?Commonly known as: MEDROL DOSEPAK ?  ? ?  ? ?TAKE these medications   ? ?acetaminophen 500 MG tablet ?Commonly known as: TYLENOL ?Take 2 tablets (1,000 mg total) by mouth every 8 (eight) hours for 5 days. ?What changed:  ?when to take this ?reasons to take this ?  ?BARIATRIC FUSION PO ?Take 2 tablets by mouth daily. ?  ?cyclobenzaprine 10 MG tablet ?Commonly known as: FLEXERIL ?Take 1 tablet by mouth once daily as needed. ?What changed:  ?how much to take ?how to take this ?when to take this ?  ?escitalopram 10 MG tablet ?Commonly known as: LEXAPRO ?Take 1 tablet (10 mg total) by mouth every morning. ?What changed: when to take this ?  ?fluticasone 50 MCG/ACT nasal spray ?Commonly known as: FLONASE ?Place 1-2 sprays into both nostrils daily as needed for allergies. ?  ?levothyroxine 150 MCG tablet ?Commonly known as: SYNTHROID ?Take 1 tablet (150 mcg total) by mouth daily before breakfast. ?  ?ondansetron 4 MG disintegrating tablet ?Commonly known as: ZOFRAN-ODT ?Take 1 tablet (4 mg total) by mouth every 6 (six) hours as needed for nausea or vomiting. ?  ?pantoprazole 40 MG tablet ?Commonly known as: PROTONIX ?Take 1 tablet (40 mg total) by mouth daily. ?  ?traMADol 50 MG tablet ?Commonly known as: ULTRAM ?Take 1 tablet (50 mg total) by mouth every 6 (six) hours as needed (pain). ?  ?valsartan-hydrochlorothiazide 320-25 MG tablet ?Commonly known as: DIOVAN-HCT ?Take 1 tablet by mouth daily. ?What changed: when to take this ?  ?VITAMIN D3 PO ?Take 2,000 Units by mouth daily. ?  ? ?  ? ? Follow-up Information   ? ? Greer Pickerel, MD. Daphane Shepherd on 05/20/2022.   ?Specialty: General Surgery ?Why: at 9:30am.  Please arrive 15 minutes prior to your appointment time.  Thank you. ?Contact information: ?[address] ?[address] ?[place] [address] ?[phone] ? ? ?  ?  ? ?  Maczis, Puja Gosai, PA-C. Go on 06/18/2022.   ?Specialty: General Surgery ?Why: at 9:15am for Dr. Redmond Pulling.  Please arrive 15 minutes prior to your appointment time.  Thank you. ?Contact information: ?[address] ?[address] ?[place] [address] ?[phone] ? ? ?  ?  ? ?  ?  ? ?  ? ? ? ?The results of significant diagnostics from this hospitalization (including imaging, microbiology, ancillary and laboratory) are listed below for reference.   ? ?Significant Diagnostic Studies: ?No results found. ? ?Labs: ?Basic Metabolic Panel: ?Recent Labs  ?Lab 04/22/22 ?0811  ?NA 138  ?K 3.7  ?CL 108  ?CO2 22  ?GLUCOSE 139*  ?BUN 25*  ?CREATININE 0.90  ?CALCIUM 8.7*  ? ?Liver Function Tests: ?Recent Labs  ?Lab 04/22/22 ?0811  ?AST 82*  ?ALT 107*  ?ALKPHOS 111  ?BILITOT 0.6  ?  PROT 7.1  ?ALBUMIN 4.1  ? ? ?CBC: ?Recent Labs  ?Lab 04/21/22 ?1226 04/22/22 ?0418  ?WBC  --  9.2  ?NEUTROABS  --  7.7  ?HGB 14.3 12.2  ?HCT 41.7 36.7  ?MCV  --  91.5  ?PLT  --  215  ? ? ?CBG: ?Recent Labs  ?Lab 04/21/22 ?2034 04/22/22 ?0007 04/22/22 ?0411 04/22/22 ?7395 04/22/22 ?1116  ?GLUCAP [place]  ? ? ?Principal Problem: ?  S/P gastric bypass ? ? ?Time coordinating discharge: 15 min ? ?Signed: ? ?Gayland Curry, MD FACS ?[facility], [place] ?[phone] ?04/22/2022, 1:36 PM ? ? ?

## 2022-04-22 NOTE — Plan of Care (Signed)
  Problem: Pain Managment: Goal: General experience of comfort will improve Outcome: Progressing   

## 2022-04-22 NOTE — Progress Notes (Signed)
Patient alert and oriented, pain is controlled. Patient is tolerating fluids, advanced to protein shake today, patient is tolerating well. Reviewed Gastric Bypass discharge instructions with patient and patient is able to articulate understanding. Provided information on BELT program, Support Group and [facility] outpatient pharmacy. All questions answered, will continue to monitor.    

## 2022-04-24 ENCOUNTER — Telehealth (HOSPITAL_COMMUNITY): Payer: Self-pay | Admitting: *Deleted

## 2022-04-24 NOTE — Telephone Encounter (Signed)
1.  Tell me about your pain and pain management? ?Pt denies any pain. ? ?2.  Let's talk about fluid intake.  How much total fluid are you taking in? ?Pt states that she is getting in at least 62oz of fluid including protein shakes and bottled water. Pt instructed to assess status and suggestions daily utilizing Hydration Action Plan on discharge folder and to call [facility] if in the "red zone".  ? ?3.  How much protein have you taken in the last 2 days? ?Pt states she is meeting her goal of 60g of protein each day with the protein shakes. ? ?4.  Have you had nausea?  Tell me about when have experienced nausea and what you did to help? ?Pt denies nausea. ?  ?5.  Has the frequency or color changed with your urine? ?Pt states that she is urinating "fine" with no changes in frequency or urgency.   ?  ?6.  Tell me what your incisions look like? ?"Incisions look fine". Pt denies a fever, chills.  Pt states incisions are not swollen, open, or draining.  Pt encouraged to call [facility] if incisions change. ?  ?7.  Have you been passing gas? BM? ?Pt states that she is having BMs. Last BM 04/23/22.   ?  ?8.  If a problem or question were to arise who would you call?  Do you know contact numbers for [facility], [facility], and [facility]? ?Pt denies dehydration symptoms.  Pt can describe s/sx of dehydration.  Pt knows to call [facility] for surgical, [facility] for nutrition, and [facility] for non-urgent questions or concerns. ?  ?9.  How has the walking going? ?Pt states she is walking around and able to be active without difficulty. ?  ?10. Are you still using your incentive spirometer?  If so, how often? ?Pt states that she is doing the I.S "a couple times a day".   Pt encouraged to use incentive spirometer, at least 10x every hour while awake until she sees the surgeon. ? ?11.  How are your vitamins and calcium going?  How are you taking them? ?Pt states that she is taking her supplements and vitamins without difficulty. ? ?Reminded patient that the first 30 days  post-operatively are important for successful recovery.  Practice good hand hygiene, wearing a mask when appropriate (since optional in most places), and minimizing exposure to people who live outside of the home, especially if they are exhibiting any respiratory, GI, or illness-like symptoms.   ? ?Pt did share some concerns about BP readings of 143/100 and 135/94 with HR within the 70's.  Pt states that she is taking her BP medication and has a f/u appt with PCP next Wednesday.  Encouraged pt to keeping logging BP and to take log to PCP office for further evaluation and discussion. ? ?

## 2022-04-28 ENCOUNTER — Other Ambulatory Visit: Payer: Self-pay | Admitting: *Deleted

## 2022-04-28 NOTE — Patient Outreach (Signed)
[facility]) Care Management ? ?04/28/2022 ? ?Marchelle Gearing ?04-May-1971 ?[id number] ? ? ?Transition of care call  ?Referral received: 04/28/2022 ?Initial outreach:04/28/2022 ?Insurance: [place] ? ?Initial telephone call to patient, 2 HIPAA identifiers verified. No ongoing care management needs identified. Pt verified she has a complete understanding of her medications with no delays. Pt has a pending appointments with her provider to adjust her b/p medication due to her hypotension. Pt active with MyChart and declined any ongoing follow up call via [facility] for transition of care. Pt has a supportive spouse and care in the home if needed. Declined [facility] and [facility] information. ? ?No other needs as this case will be closed. ? ?Raina Mina, RN ?Care Management Coordinator ?[place] ?Main Office [phone]  ?

## 2022-04-28 NOTE — Patient Outreach (Signed)
Received a hospital discharge notification for [name] . ?I have assigned Raina Mina, RN to call for follow up and determine if there are any Case Management needs.  ?  ?[name], CBCS, CMAA ?[place] Management Assistant ?[facility] ?[phone]   ?

## 2022-04-29 ENCOUNTER — Other Ambulatory Visit (HOSPITAL_BASED_OUTPATIENT_CLINIC_OR_DEPARTMENT_OTHER): Payer: Self-pay

## 2022-05-05 ENCOUNTER — Encounter: Payer: No Typology Code available for payment source | Attending: General Surgery | Admitting: Dietician

## 2022-05-05 ENCOUNTER — Encounter: Payer: Self-pay | Admitting: Dietician

## 2022-05-05 DIAGNOSIS — E669 Obesity, unspecified: Secondary | ICD-10-CM | POA: Diagnosis present

## 2022-05-05 NOTE — Progress Notes (Signed)
2 Week Post-Operative Nutrition Class   Patient was seen on 05/05/2022 for Post-Operative Nutrition education at the [facility].    Surgery date: 04/21/2022 Surgery type: RYGB Anthropometrics  Start weight at [facility]: 243.8 lbs (date: 02/05/2022)  Height: 66.5 in BMI: 38.76 kg/m2   Wt today: 229.0 lbs   Clinical  Medical hx: anxiety, HTN, GERD Medications: see list  Labs: cholesterol 217, HDL 32, Triglycerides 275, LDL 143, A1C 6.5, Creatinine 1.22 Notable signs/symptoms: N/A Any previous deficiencies? Yes: iron, vitamin D   Body Composition Scale 05/05/2022  Current Body Weight 229.0  Total Body Fat % 42.6  Visceral Fat 14  Fat-Free Mass % 57.3   Total Body Water % 43.1  Muscle-Mass lbs 32.2  BMI 36.9  Body Fat Displacement          Torso  lbs 60.5         Left Leg  lbs 12.1         Right Leg  lbs 12.1         Left Arm  lbs 6.0         Right Arm   lbs 6.0      The following the learning objectives were met by the patient during this course: Identifies Phase 3 (Soft, High Proteins) Dietary Goals and will begin from 2 weeks post-operatively to 2 months post-operatively Identifies appropriate sources of fluids and proteins  Identifies appropriate fat sources and healthy verses unhealthy fat types   States protein recommendations and appropriate sources post-operatively Identifies the need for appropriate texture modifications, mastication, and bite sizes when consuming solids Identifies appropriate fat consumption and sources Identifies appropriate multivitamin and calcium sources post-operatively Describes the need for physical activity post-operatively and will follow MD recommendations States when to call healthcare provider regarding medication questions or post-operative complications   Handouts given during class include: Phase 3A: Soft, High Protein Diet Handout Phase 3 High Protein Meals Healthy Fats   Follow-Up Plan: Patient will  follow-up at [facility] in 6 weeks for 2 month post-op nutrition visit for diet advancement per MD.  

## 2022-05-12 ENCOUNTER — Telehealth: Payer: No Typology Code available for payment source | Admitting: Nurse Practitioner

## 2022-05-12 ENCOUNTER — Telehealth: Payer: Self-pay | Admitting: Dietician

## 2022-05-12 DIAGNOSIS — J029 Acute pharyngitis, unspecified: Secondary | ICD-10-CM

## 2022-05-12 NOTE — Progress Notes (Signed)
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  Several viruses also cause sore throats, the majority of sore throats in adults are from viruses. I know your son did have strep, but taking antibiotics when you have a virus can lead to complications as you have already experienced.   Your GI tract is also post surgical which can elevate the risk when taking unnecessary antibiotics.    I feel your condition warrants further evaluation and I recommend that you be seen for a face to face visit.  Please contact your primary care physician practice to be seen. Many offices offer virtual options to be seen via video if you are not comfortable going in person to a medical facility at this time.  NOTE: You will NOT be charged for this eVisit.  If you do not have a PCP, [facility] offers a free physician referral service available at [phone]. Our trained staff has the experience, knowledge and resources to put you in touch with a physician who is right for you.    If you are having a true medical emergency please call 911.   Your e-visit answers were reviewed by a board certified advanced clinical practitioner to complete your personal care plan.  Thank you for using e-Visits.

## 2022-05-12 NOTE — Telephone Encounter (Signed)
RD called pt to verify fluid intake once starting soft, solid proteins 2 week post-bariatric surgery.   Daily Fluid intake:  Daily Protein intake:  Bowel Habits:   Concerns/issues:   Left voice message 

## 2022-05-13 ENCOUNTER — Other Ambulatory Visit (HOSPITAL_BASED_OUTPATIENT_CLINIC_OR_DEPARTMENT_OTHER): Payer: Self-pay

## 2022-05-13 MED ORDER — AMOXICILLIN 500 MG PO CAPS
ORAL_CAPSULE | ORAL | 0 refills | Status: DC
Start: 2022-05-13 — End: 2023-01-18
  Filled 2022-05-13: qty 20, 10d supply, fill #0

## 2022-06-02 ENCOUNTER — Encounter: Payer: No Typology Code available for payment source | Attending: General Surgery | Admitting: Skilled Nursing Facility1

## 2022-06-02 ENCOUNTER — Encounter: Payer: Self-pay | Admitting: Skilled Nursing Facility1

## 2022-06-02 NOTE — Progress Notes (Signed)
Bariatric Nutrition Follow-Up Visit Medical Nutrition Therapy   NUTRITION ASSESSMENT   Surgery date: 04/21/2022 Surgery type: RYGB  Anthropometrics  Start weight at [facility]: 243.8 lbs (date: 02/05/2022)  Height: 66.5 in  Wt today: 210.8 lbs   Clinical  Medical hx: anxiety, HTN, GERD Medications: see list  Labs: cholesterol 217, HDL 32, Triglycerides 275, LDL 143, A1C 6.5, Creatinine 1.22 Notable signs/symptoms: N/A Any previous deficiencies? Yes: iron, vitamin D   Body Composition Scale 05/05/2022 06/02/2022  Current Body Weight 229.0 210.8  Total Body Fat % 42.6 41.2  Visceral Fat 14 12  Fat-Free Mass % 57.3 58.7   Total Body Water % 43.1 43.8  Muscle-Mass lbs 32.2 31  BMI 36.9 33.9  Body Fat Displacement           Torso  lbs 60.5 53.8         Left Leg  lbs 12.1 10.7         Right Leg  lbs 12.1 10.7         Left Arm  lbs 6.0 5.3         Right Arm   lbs 6.0 5.3     Lifestyle & Dietary Hx  Pt states she was able to play with her granddaughter all day and has great energy.  Pt states she was off her blood pressure medication but had to be started back on it so is a bit frustrated with that.    Estimated daily fluid intake: 64 oz Estimated daily protein intake: 80+ g Supplements: multi and calcium Current average weekly physical activity: walking 2 miles (pumping arms) 5 days a week  (joined [place])   24-Hr Dietary Recall First Meal 9-9:30: hard boiled egg and greek yogurt  Snack:  nuts Second Meal: fairlife protein shake Snack:  cheese Third Meal: hamburger patty Snack: half cup of milk Beverages: hot tea + stevia, water + flavorings, plain water  Post-Op Goals/ Signs/ Symptoms Using straws: no Drinking while eating: no Chewing/swallowing difficulties: no Changes in vision: no Changes to mood/headaches: no Hair loss/changes to skin/nails: no Difficulty focusing/concentrating: no Sweating: no Limb weakness: no Dizziness/lightheadedness: no Palpitations:  no  Carbonated/caffeinated beverages: no N/V/D/C/Gas: no Abdominal pain: no Dumping syndrome: no    NUTRITION DIAGNOSIS  Overweight/obesity (Roundup-3.3) related to past poor dietary habits and physical inactivity as evidenced by completed bariatric surgery and following dietary guidelines for continued weight loss and healthy nutrition status.     NUTRITION INTERVENTION Nutrition counseling (C-1) and education (E-2) to facilitate bariatric surgery goals, including: Diet advancement to the next phase (phase 4) now including non starchy vegetables  The importance of consuming adequate calories as well as certain nutrients daily due to the body's need for essential vitamins, minerals, and fats The importance of daily physical activity and to reach a goal of at least 150 minutes of moderate to vigorous physical activity weekly (or as directed by their physician) due to benefits such as increased musculature and improved lab values The importance of intuitive eating specifically learning hunger-satiety cues and understanding the importance of learning a new body: The importance of mindful eating to avoid grazing behaviors   Goals: -Continue to aim for a minimum of 64 fluid ounces 7 days a week with at least 30 ounces being plain water  -Eat non-starchy vegetables 2 times a day 7 days a week  -Start out with soft cooked vegetables today and tomorrow; if tolerated begin to eat raw vegetables or cooked including salads  -Eat  your 3 ounces of protein first then start in on your non-starchy vegetables; once you understand how much of your meal leads to satisfaction and not full while still eating 3 ounces of protein and non-starchy vegetables you can eat them in any order   -Continue to aim for 30 minutes of activity at least 5 times a week  -Do NOT cook with/add to your food: alfredo sauce, cheese sauce, barbeque sauce, ketchup, fat back, butter, bacon grease, grease, Crisco, OR SUGAR   Handouts  Provided Include  Phase 4  Learning Style & Readiness for Change Teaching method utilized: Visual & Auditory  Demonstrated degree of understanding via: Teach Back  Readiness Level: action Barriers to learning/adherence to lifestyle change: non identified  RD's Notes for Next Visit Assess adherence to pt chosen goals    MONITORING & EVALUATION Dietary intake, weekly physical activity, body weight  Next Steps Patient is to follow-up in 3 months

## 2022-06-05 ENCOUNTER — Other Ambulatory Visit (HOSPITAL_BASED_OUTPATIENT_CLINIC_OR_DEPARTMENT_OTHER): Payer: Self-pay

## 2022-06-17 ENCOUNTER — Telehealth: Payer: Self-pay

## 2022-06-17 DIAGNOSIS — K760 Fatty (change of) liver, not elsewhere classified: Secondary | ICD-10-CM

## 2022-06-17 DIAGNOSIS — R7989 Other specified abnormal findings of blood chemistry: Secondary | ICD-10-CM

## 2022-06-17 IMAGING — CR DG CHEST 2V
2 series · 2 of 2 positions shown · non-contrast
Comparison: 05/10/2021

CLINICAL DATA: 50-year-old female with a history of obesity,
preoperative study

EXAM:
CHEST - 2 VIEW

[w chest pa]
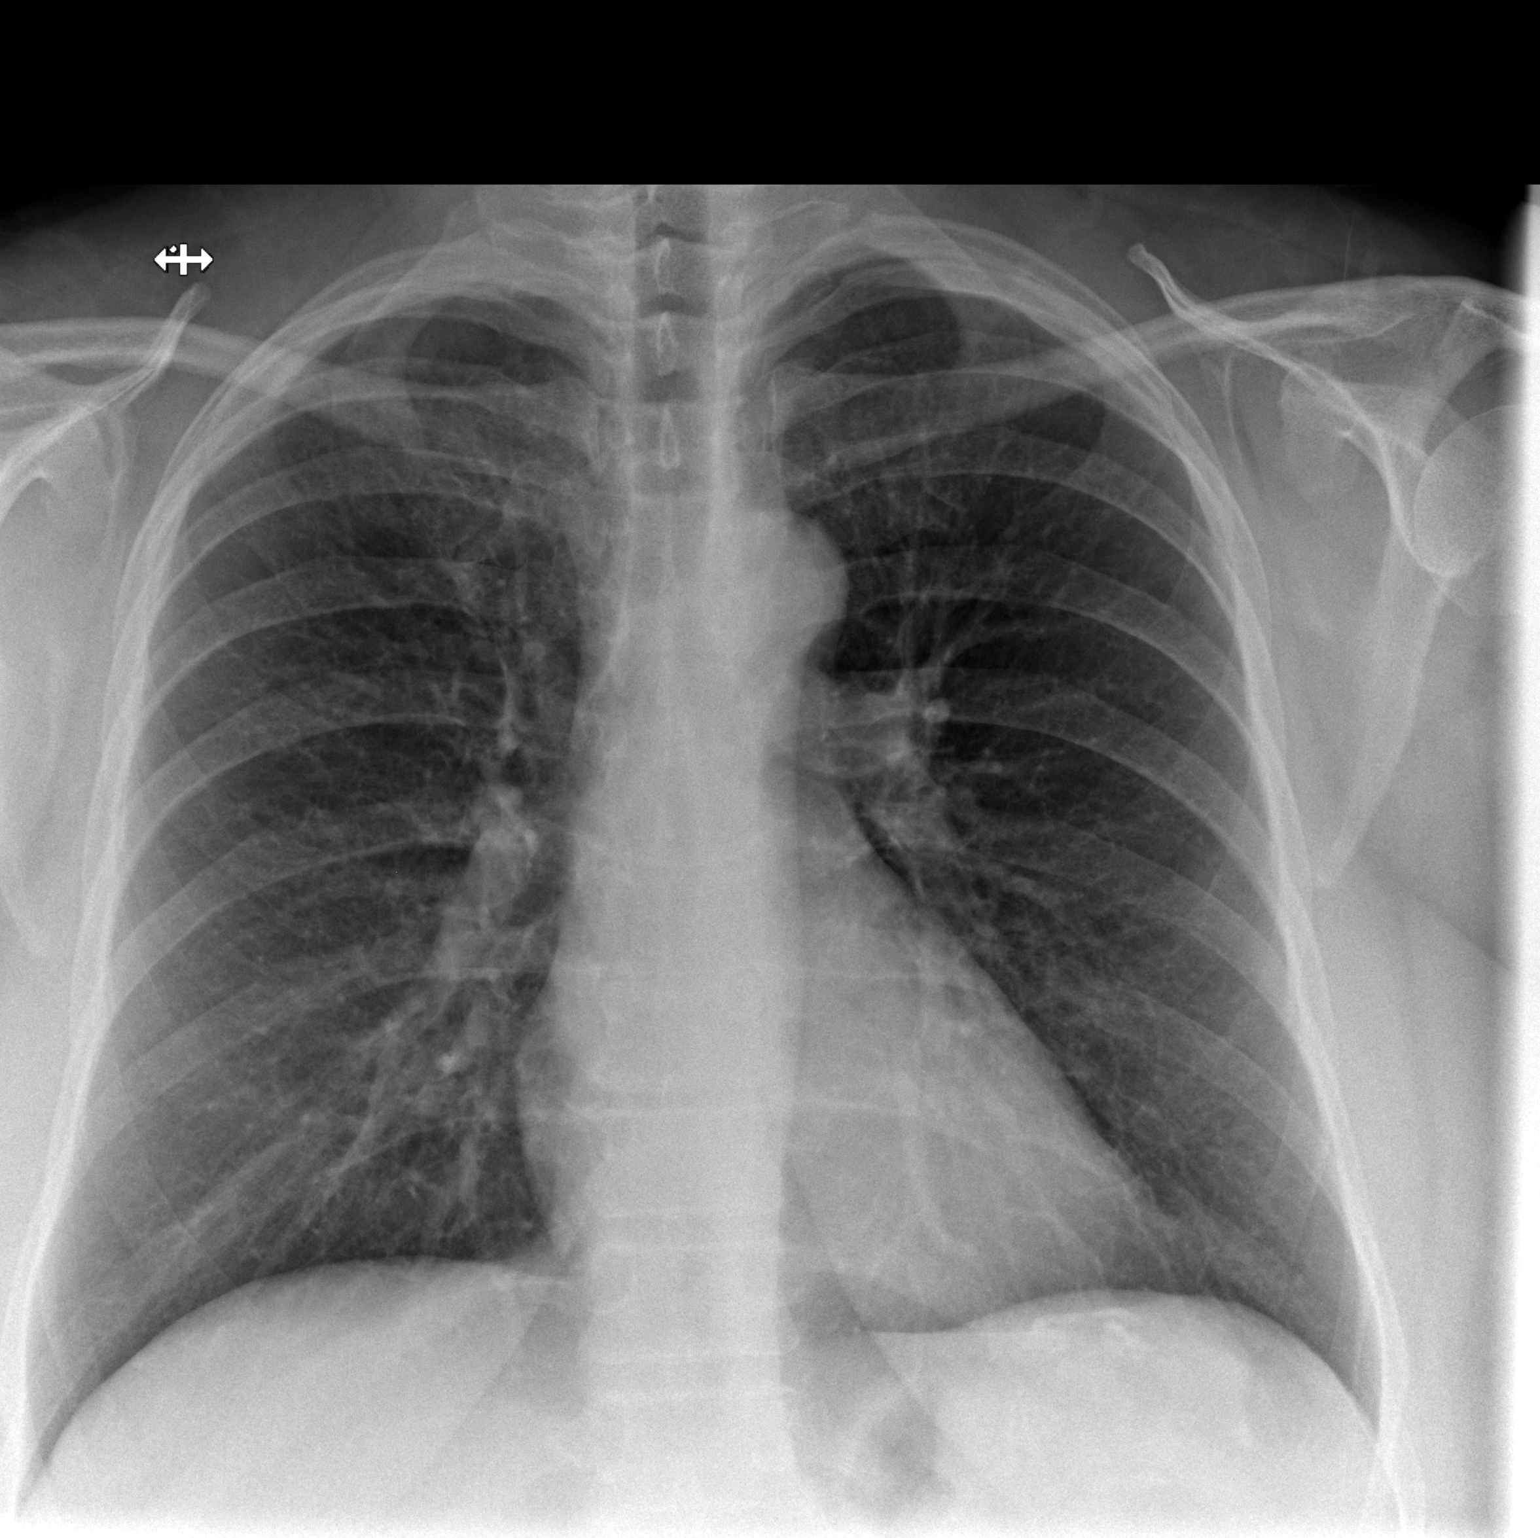

[w chest lat]
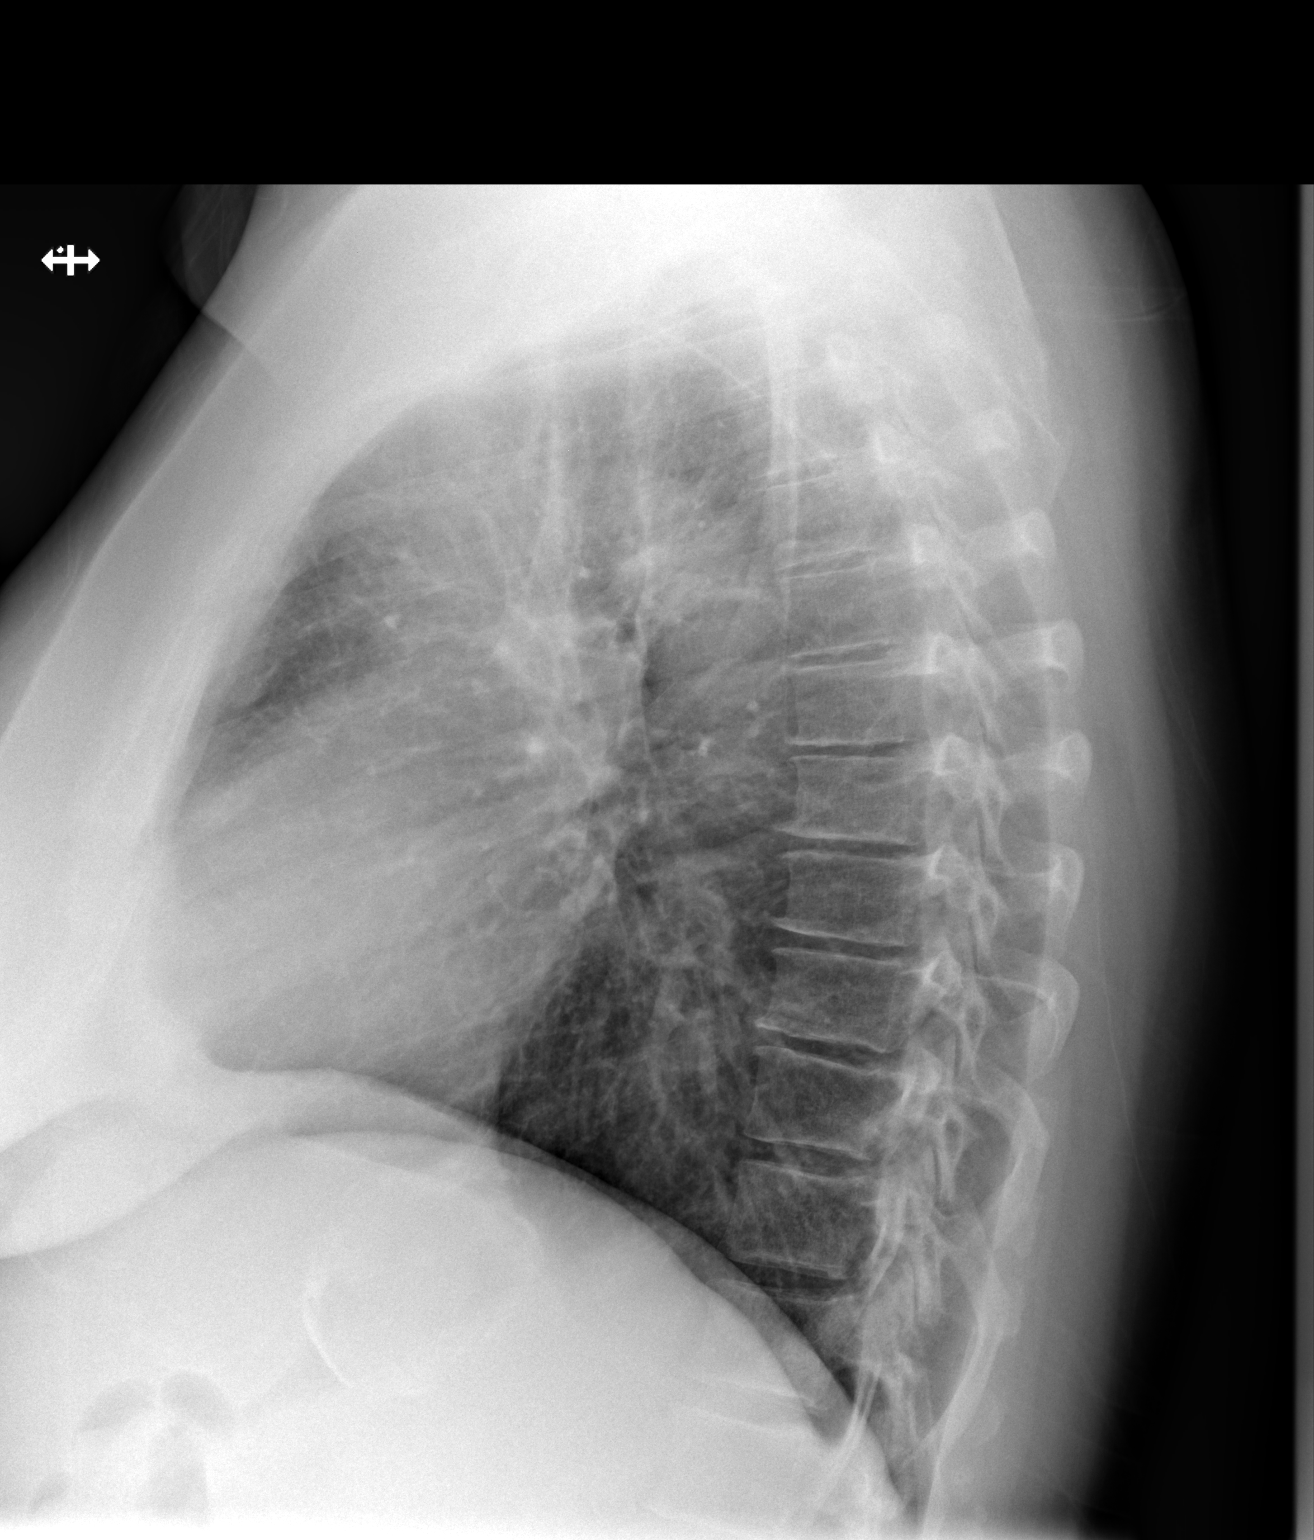

[2 of 2 positions shown; findings below may reference images not displayed]

FINDINGS: Cardiomediastinal silhouette unchanged in size and contour. No
evidence of central vascular congestion. No interlobular septal
thickening.

No pneumothorax or pleural effusion. Coarsened interstitial
markings, with no confluent airspace disease.

No acute displaced fracture. Degenerative changes of the spine.
IMPRESSION: Negative for acute cardiopulmonary disease

## 2022-06-17 NOTE — Telephone Encounter (Signed)
Lab order in epic.  MyChart message sent to patient with lab reminder.

## 2022-06-17 NOTE — Telephone Encounter (Signed)
Patient reviewed MyChart message. Last read by Marchelle Gearing at 10:15 AM on 06/17/2022.

## 2022-06-17 NOTE — Telephone Encounter (Signed)
-----   Message from Yevette Edwards, RN sent at 05/12/2022 12:27 PM EDT ----- Regarding: Labs Hepatic function panel - need to enter order

## 2022-06-22 ENCOUNTER — Other Ambulatory Visit (HOSPITAL_BASED_OUTPATIENT_CLINIC_OR_DEPARTMENT_OTHER): Payer: Self-pay

## 2022-06-22 MED ORDER — ESCITALOPRAM OXALATE 10 MG PO TABS
10.0000 mg | ORAL_TABLET | Freq: Every morning | ORAL | 4 refills | Status: DC
  Filled 2022-06-22: qty 90, 90d supply, fill #0
  Filled 2022-09-15: qty 90, 90d supply, fill #1
  Filled 2023-01-26: qty 90, 90d supply, fill #2
  Filled 2023-04-21: qty 90, 90d supply, fill #3

## 2022-06-22 MED ORDER — VALSARTAN-HYDROCHLOROTHIAZIDE 320-25 MG PO TABS
1.0000 | ORAL_TABLET | Freq: Every day | ORAL | 4 refills | Status: DC
  Filled 2022-06-22: qty 90, 90d supply, fill #0
  Filled 2022-12-02: qty 90, 90d supply, fill #1
  Filled 2023-04-01: qty 90, 90d supply, fill #2

## 2022-07-01 IMAGING — CT CT ABD-PELV W/ CM
2 of 5 series · 16 of 46 positions shown, 18 images · IV contrast (APPLIED)
Comparison: Previous studies including the examination of
05/10/2021

CLINICAL DATA: Evaluating splenic cyst

EXAM:
CT ABDOMEN AND PELVIS WITH CONTRAST
TECHNIQUE: Multidetector CT imaging of the abdomen and pelvis was performed
using the standard protocol following bolus administration of
intravenous contrast.

[Series 2: axial st · axial · 0.85mm/px · z∈[-532,-107]mm · 13 of 99 slices shown, 15 images]
[im 7/99  soft-tissue]
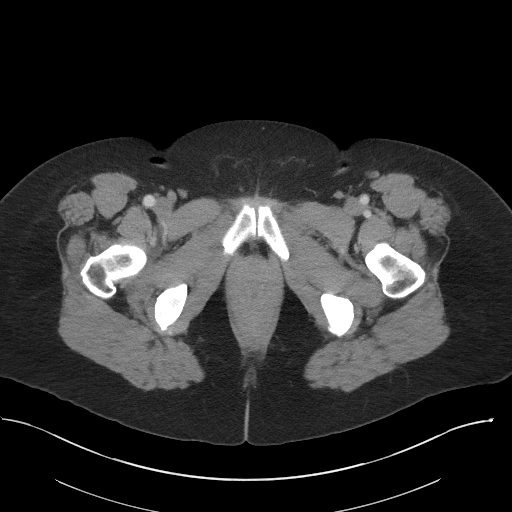
[im 7/99  bone]
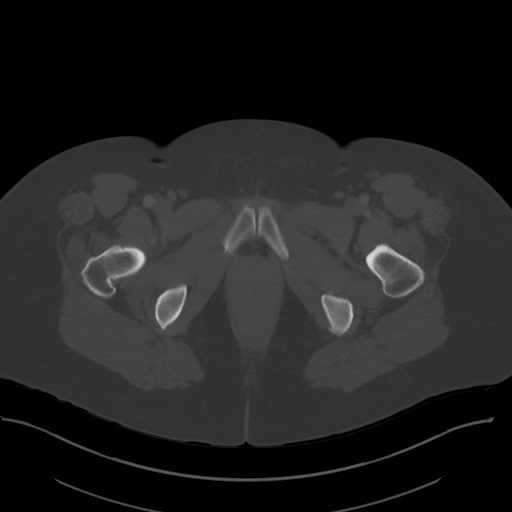
[im 14/99  soft-tissue]
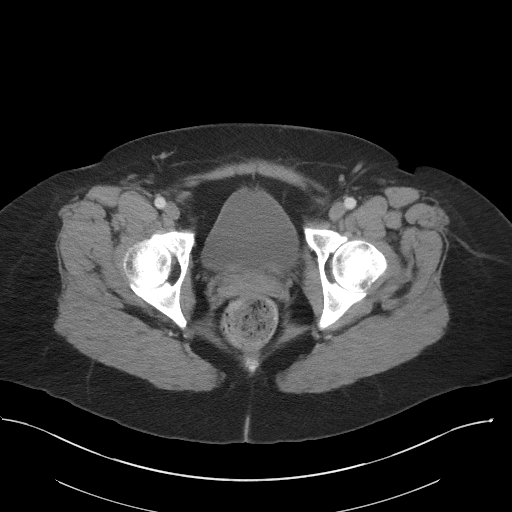
[im 20/99  soft-tissue]
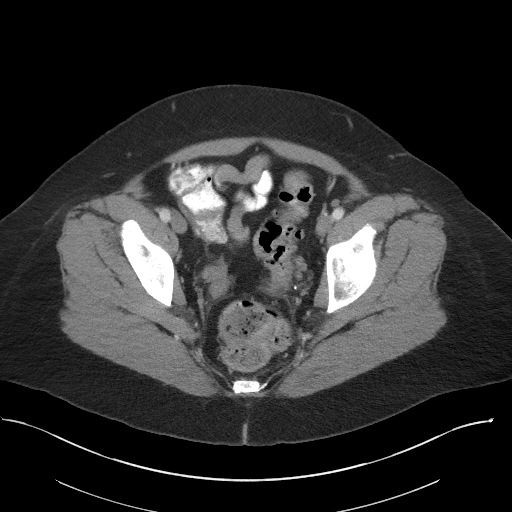
[im 27/99  soft-tissue]
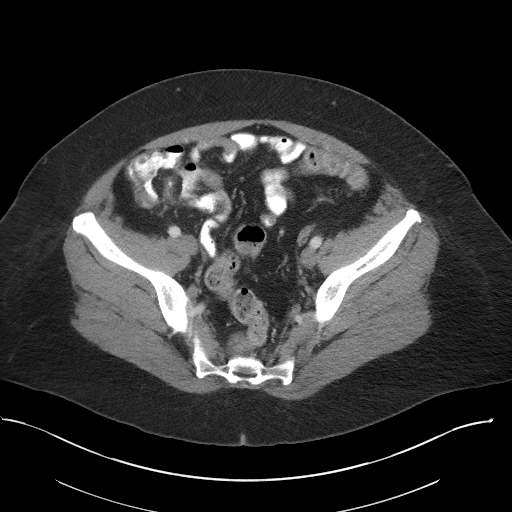
[im 33/99  soft-tissue]
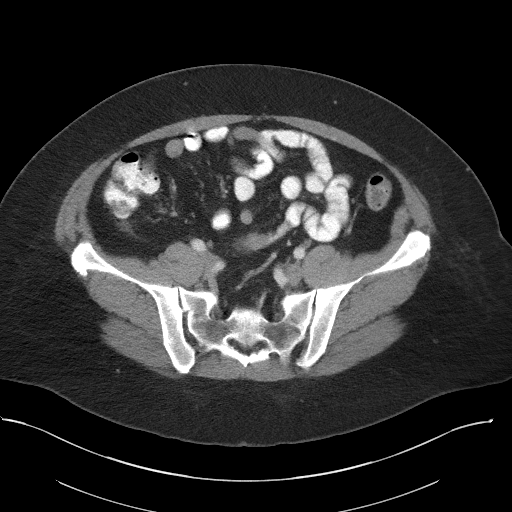
[im 40/99  soft-tissue]
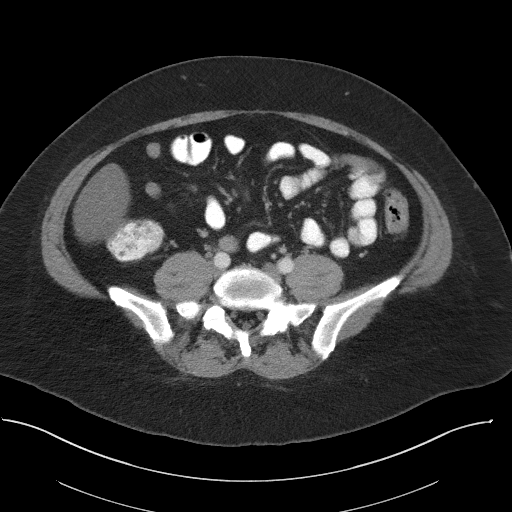
[im 53/99  soft-tissue]
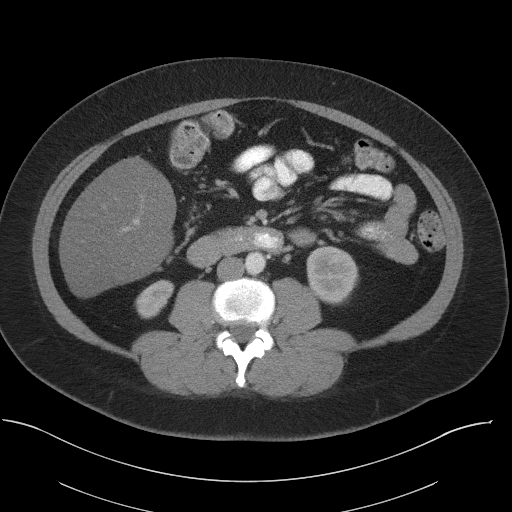
[im 59/99  soft-tissue]
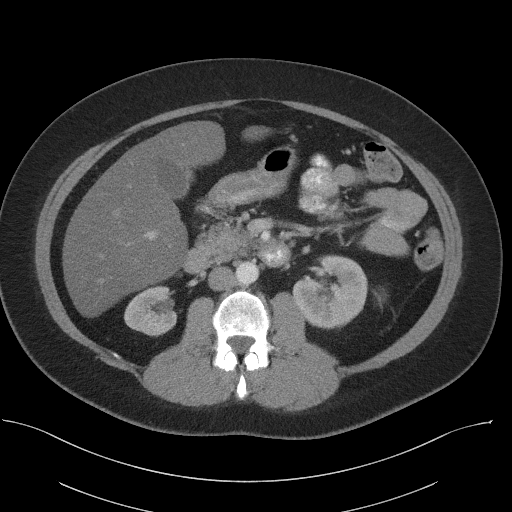
[im 66/99  soft-tissue]
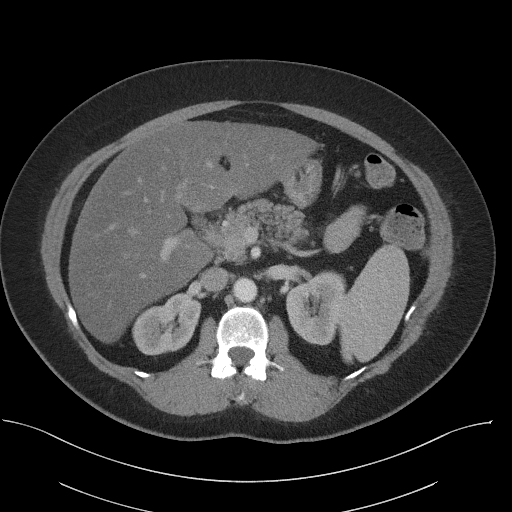
[im 66/99  bone]
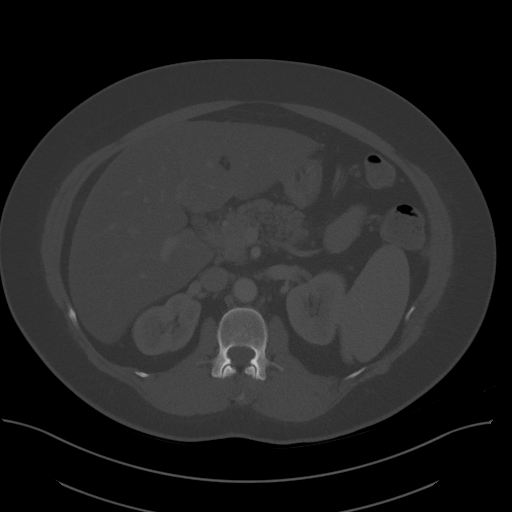
[im 72/99  soft-tissue]
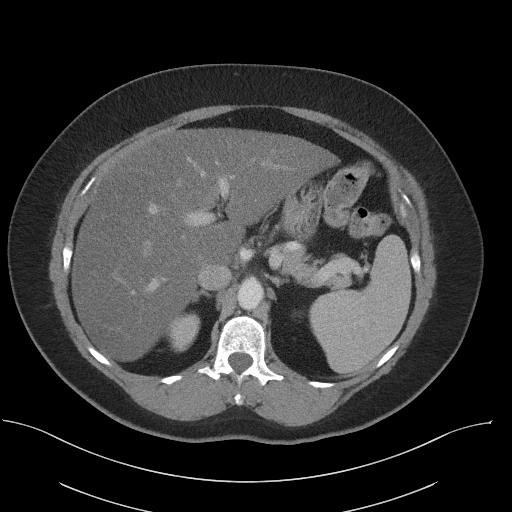
[im 79/99  soft-tissue]
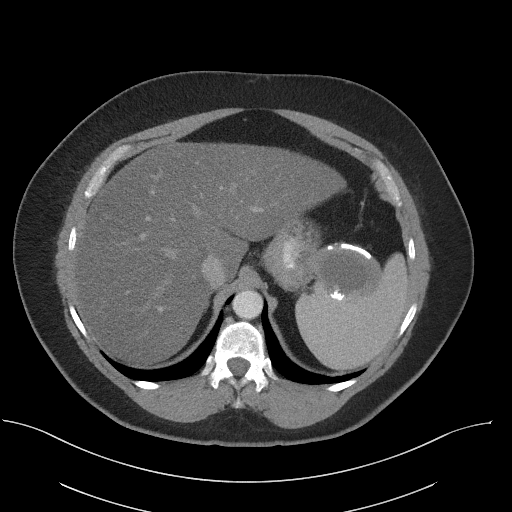
[im 85/99  soft-tissue]
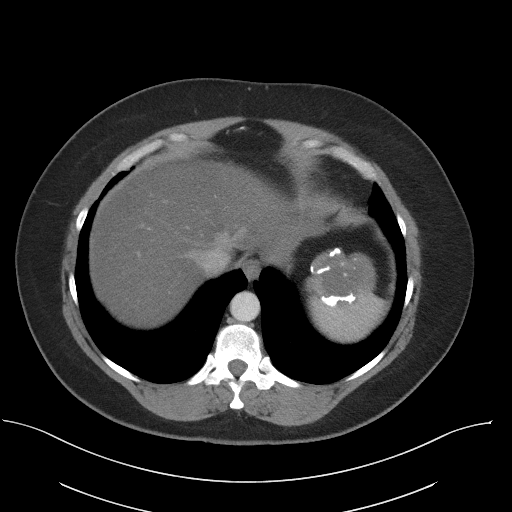
[im 92/99  soft-tissue]
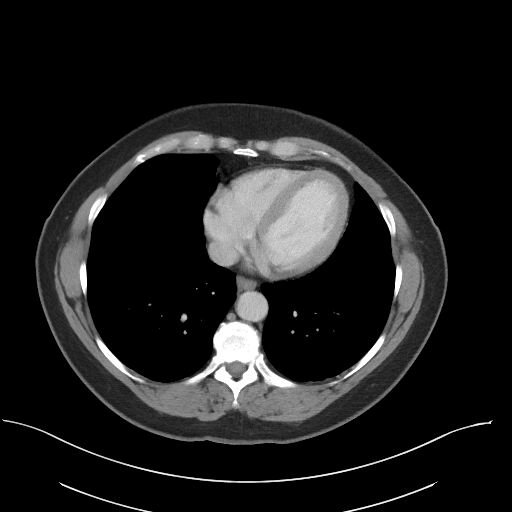

[Series 4: coronal st · coronal · 0.81mm/px · 3 of 108 slices shown]
[im 36/108  soft-tissue]
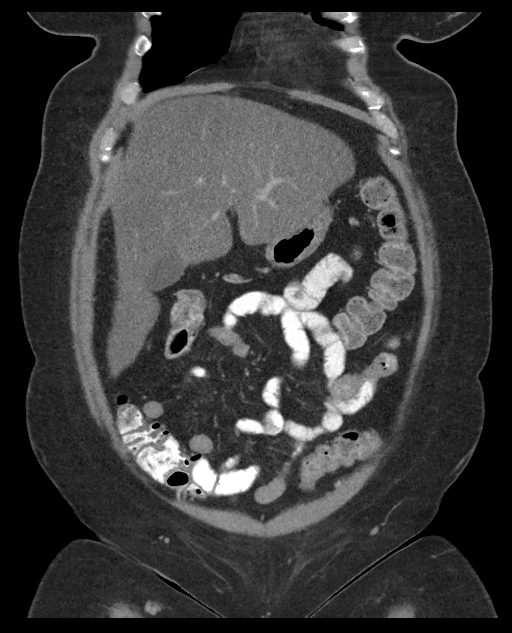
[im 48/108  soft-tissue]
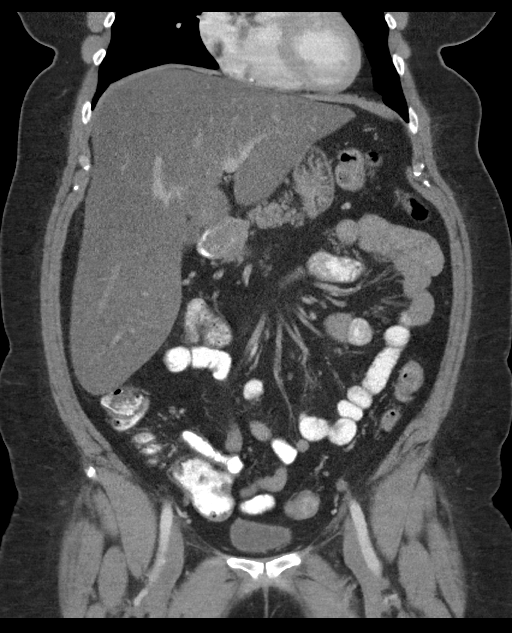
[im 60/108  soft-tissue]
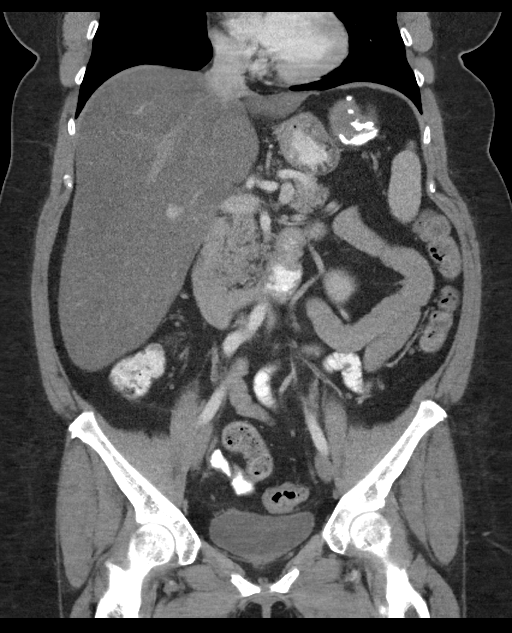

[16 of 46 positions shown; findings below may reference images not displayed]

RADIATION DOSE REDUCTION: This exam was performed according to the
departmental dose-optimization program which includes automated
exposure control, adjustment of the mA and/or kV according to
patient size and/or use of iterative reconstruction technique.

CONTRAST:  100mL OMNIPAQUE IOHEXOL 300 MG/ML  SOLN
FINDINGS: Lower chest: Unremarkable.

Hepatobiliary: There is marked enlargement of liver measuring
cm in length. There is fatty infiltration. Gallbladder is
unremarkable.

Pancreas: No focal abnormality is seen.

Spleen: Spleen measures 14.5 cm in maximum diameter. There is 5.7 x
5.3 cm smooth marginated fluid density lesion in the anterior aspect
of the spleen with peripheral rim of coarse calcifications. This
lesion has not changed significantly since 05/10/2021.

Adrenals/Urinary Tract: Adrenals are unremarkable. There is no
hydronephrosis. There are no renal or ureteral stones. Urinary
bladder is unremarkable.

Stomach/Bowel: Stomach is not distended. Small bowel loops are not
dilated. Appendix is not distinctly seen. There is no pericecal
inflammation. There is no significant wall thickening in colon. Few
diverticula are seen in colon without signs of focal diverticulitis.

Vascular/Lymphatic: Unremarkable.

Reproductive: Uterus is not seen.

Other: There is no ascites or pneumoperitoneum. Small umbilical
hernia containing fat is seen.

Musculoskeletal: Unremarkable
IMPRESSION: There is no evidence of intestinal obstruction or pneumoperitoneum.
There is no hydronephrosis.

There is 5.7 x 5.3 cm smooth marginated fluid density lesion with
coarse rim of calcifications in the anterior aspect of the spleen
with no significant interval change. There are no internal
septations or mural nodules. Findings suggest benign splenic cyst.

Markedly enlarged fatty liver. Enlarged spleen. Few diverticula are
seen in colon without signs of focal diverticulitis.

## 2022-07-09 ENCOUNTER — Encounter: Payer: Self-pay | Admitting: Physician Assistant

## 2022-08-12 ENCOUNTER — Other Ambulatory Visit (HOSPITAL_BASED_OUTPATIENT_CLINIC_OR_DEPARTMENT_OTHER): Payer: Self-pay

## 2022-08-25 ENCOUNTER — Other Ambulatory Visit (HOSPITAL_BASED_OUTPATIENT_CLINIC_OR_DEPARTMENT_OTHER): Payer: Self-pay

## 2022-08-25 MED ORDER — CYCLOBENZAPRINE HCL 10 MG PO TABS
10.0000 mg | ORAL_TABLET | Freq: Every day | ORAL | 3 refills | Status: DC
  Filled 2022-08-25: qty 90, 90d supply, fill #0
  Filled 2022-11-24: qty 90, 90d supply, fill #1
  Filled 2023-03-10: qty 90, 90d supply, fill #2
  Filled 2023-06-04: qty 90, 90d supply, fill #3

## 2022-08-26 ENCOUNTER — Other Ambulatory Visit (HOSPITAL_BASED_OUTPATIENT_CLINIC_OR_DEPARTMENT_OTHER): Payer: Self-pay

## 2022-08-28 ENCOUNTER — Other Ambulatory Visit (HOSPITAL_BASED_OUTPATIENT_CLINIC_OR_DEPARTMENT_OTHER): Payer: Self-pay

## 2022-09-02 ENCOUNTER — Encounter
Payer: No Typology Code available for payment source | Attending: Internal Medicine | Admitting: Skilled Nursing Facility1

## 2022-09-02 ENCOUNTER — Encounter: Payer: Self-pay | Admitting: Skilled Nursing Facility1

## 2022-09-02 DIAGNOSIS — E669 Obesity, unspecified: Secondary | ICD-10-CM | POA: Insufficient documentation

## 2022-09-02 NOTE — Progress Notes (Signed)
Bariatric Nutrition Follow-Up Visit Medical Nutrition Therapy   NUTRITION ASSESSMENT   Surgery date: 04/21/2022 Surgery type: RYGB  Anthropometrics  Start weight at [facility]: 243.8 lbs (date: 02/05/2022)  Height: 66.5 in  Wt today: pt declined  Clinical  Medical hx: anxiety, HTN, GERD Medications: see list  Labs: cholesterol 217, HDL 32, Triglycerides 275, LDL 143, A1C 6.5, Creatinine 1.22 Notable signs/symptoms: N/A Any previous deficiencies? Yes: iron, vitamin D   Body Composition Scale 05/05/2022 06/02/2022  Current Body Weight 229.0 210.8  Total Body Fat % 42.6 41.2  Visceral Fat 14 12  Fat-Free Mass % 57.3 58.7   Total Body Water % 43.1 43.8  Muscle-Mass lbs 32.2 31  BMI 36.9 33.9  Body Fat Displacement           Torso  lbs 60.5 53.8         Left Leg  lbs 12.1 10.7         Right Leg  lbs 12.1 10.7         Left Arm  lbs 6.0 5.3         Right Arm   lbs 6.0 5.3     Lifestyle & Dietary Hx  Pt is doing fantastic!  Pt states her mother and father in law passed away.  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  Pt states this is just how she eats now so she is not worried at all.   Estimated daily fluid intake: 64 oz Estimated daily protein intake: 80+ g Supplements: multi and calcium Current average weekly physical activity: walking 1 mile   24-Hr Dietary Recall First Meal 8:30-9: mini bagel + 1 egg + butter or fair life protein life  Snack:   Second Meal: meatloaf and non starchy veggies Snack: fruit + cheese Third Meal: pork chop and veggies or protein and grapes   Snack: 2 sugar cookies Beverages: hot tea + stevia, water + flavorings, minimal plain water, coffee + stevia and milk  Post-Op Goals/ Signs/ Symptoms Using straws: no Drinking while eating: no Chewing/swallowing difficulties: no Changes in vision: no Changes to mood/headaches: no Hair loss/changes to skin/nails: no Difficulty focusing/concentrating:  no Sweating: no Limb weakness: no Dizziness/lightheadedness: no Palpitations: no  Carbonated/caffeinated beverages: no N/V/D/C/Gas: no Abdominal pain: no Dumping syndrome: no    NUTRITION DIAGNOSIS  Overweight/obesity ([place]-3.3) related to past poor dietary habits and physical inactivity as evidenced by completed bariatric surgery and following dietary guidelines for continued weight loss and healthy nutrition status.     NUTRITION INTERVENTION Nutrition counseling (C-1) and education (E-2) to facilitate bariatric surgery goals, including: Diet advancement to the next phase (phase 4) now including non starchy vegetables  The importance of consuming adequate calories as well as certain nutrients daily due to the body's need for essential vitamins, minerals, and fats The importance of daily physical activity and to reach a goal of at least 150 minutes of moderate to vigorous physical activity weekly (or as directed by their physician) due to benefits such as increased musculature and improved lab values The importance of intuitive eating specifically learning hunger-satiety cues and understanding the importance of learning a new body: The importance of mindful eating to avoid grazing behaviors  Encouraged patient to honor their body's internal hunger and fullness cues.  Throughout the day, check in mentally and rate hunger. Stop eating when satisfied not full regardless of how much food is left on the plate.  Get more if  still hungry 20-30 minutes later.  The key is to honor satisfaction so throughout the meal, rate fullness factor and stop when comfortably satisfied not physically full. The key is to honor hunger and fullness without any feelings of guilt or shame.  Pay attention to what the internal cues are, rather than any external factors. This will enhance the confidence you have in listening to your own body and following those internal cues enabling you to increase how often you eat when you  are hungry not out of appetite and stop when you are satisfied not full.  Encouraged pt to continue to eat balanced meals inclusive of non starchy vegetables 2 times a day 7 days a week Encouraged pt to choose lean protein sources: limiting beef, pork, sausage, hotdogs, and lunch meat Encourage pt to choose healthy fats such as plant based limiting animal fats Encouraged pt to continue to drink a minium 64 fluid ounces with half being plain water to satisfy proper hydration     Learning Style & Readiness for Change Teaching method utilized: Visual & Auditory  Demonstrated degree of understanding via: Teach Back  Readiness Level: action Barriers to learning/adherence to lifestyle change: non identified  RD's Notes for Next Visit Assess adherence to pt chosen goals    MONITORING & EVALUATION Dietary intake, weekly physical activity, body weight  Next Steps Patient is to follow-up in 4 months

## 2022-09-15 ENCOUNTER — Other Ambulatory Visit (HOSPITAL_BASED_OUTPATIENT_CLINIC_OR_DEPARTMENT_OTHER): Payer: Self-pay

## 2022-09-17 ENCOUNTER — Other Ambulatory Visit (HOSPITAL_BASED_OUTPATIENT_CLINIC_OR_DEPARTMENT_OTHER): Payer: Self-pay

## 2022-09-17 MED ORDER — METHOCARBAMOL 500 MG PO TABS
500.0000 mg | ORAL_TABLET | Freq: Every day | ORAL | 3 refills | Status: DC | PRN
  Filled 2022-09-17: qty 30, 30d supply, fill #0
  Filled 2023-03-10: qty 30, 30d supply, fill #1
  Filled 2023-07-29: qty 30, 30d supply, fill #2

## 2022-09-18 ENCOUNTER — Ambulatory Visit: Payer: No Typology Code available for payment source | Admitting: Internal Medicine

## 2022-09-28 ENCOUNTER — Ambulatory Visit: Payer: No Typology Code available for payment source | Admitting: Physician Assistant

## 2022-09-30 ENCOUNTER — Ambulatory Visit: Payer: No Typology Code available for payment source | Admitting: Internal Medicine

## 2022-10-09 ENCOUNTER — Other Ambulatory Visit (HOSPITAL_BASED_OUTPATIENT_CLINIC_OR_DEPARTMENT_OTHER): Payer: Self-pay

## 2022-10-09 MED ORDER — DOXYCYCLINE HYCLATE 100 MG PO CAPS
100.0000 mg | ORAL_CAPSULE | Freq: Every day | ORAL | 0 refills | Status: DC
  Filled 2022-10-09: qty 7, 7d supply, fill #0

## 2022-10-27 ENCOUNTER — Other Ambulatory Visit: Payer: Self-pay | Admitting: Internal Medicine

## 2022-10-27 DIAGNOSIS — Z1231 Encounter for screening mammogram for malignant neoplasm of breast: Secondary | ICD-10-CM

## 2022-11-09 ENCOUNTER — Ambulatory Visit
Admission: RE | Admit: 2022-11-09 | Discharge: 2022-11-09 | Disposition: A | Discharge status: Home / Self Care | Payer: No Typology Code available for payment source | Source: Ambulatory Visit | Attending: Internal Medicine | Admitting: Internal Medicine

## 2022-11-09 DIAGNOSIS — Z1231 Encounter for screening mammogram for malignant neoplasm of breast: Secondary | ICD-10-CM

## 2022-11-10 ENCOUNTER — Ambulatory Visit
Admission: RE | Admit: 2022-11-10 | Discharge: 2022-11-10 | Disposition: A | Discharge status: Home / Self Care | Payer: No Typology Code available for payment source | Source: Ambulatory Visit | Attending: Emergency Medicine | Admitting: Emergency Medicine

## 2022-11-10 ENCOUNTER — Ambulatory Visit (INDEPENDENT_AMBULATORY_CARE_PROVIDER_SITE_OTHER): Payer: No Typology Code available for payment source

## 2022-11-10 VITALS — BP 129/91 | HR 75 | Temp 97.9°F | Resp 16

## 2022-11-10 DIAGNOSIS — S63641A Sprain of metacarpophalangeal joint of right thumb, initial encounter: Secondary | ICD-10-CM

## 2022-11-10 DIAGNOSIS — M79641 Pain in right hand: Secondary | ICD-10-CM | POA: Diagnosis not present

## 2022-11-10 DIAGNOSIS — W19XXXA Unspecified fall, initial encounter: Secondary | ICD-10-CM

## 2022-11-10 NOTE — ED Triage Notes (Signed)
Pt states she slammed her right hand in the car door one week ago and then 3 days ago she fell and landed on it. Bruising and swelling noted to right hand and thumb.  Has been using ice and tylenol at home.

## 2022-11-10 NOTE — Discharge Instructions (Signed)
I am happy to tell you that your right thumb, hand and wrist are not broken.  Please wear the thumb spica splint that we have provided to you during your visit today is much as possible for the next 6 weeks for a presumed sprain of your right thumb.  In the next 7 to 10 days, if you do not have meaningful improvement of your pain and thumb strength, consider following up with hand specialist for further evaluation and treatment.  You are welcome to continue taking ibuprofen and/or naproxen and applying ice to your hand as needed to reduce pain and swelling.  Thank you for visiting urgent care today.

## 2022-11-10 NOTE — ED Provider Notes (Signed)
[facility]    CSN: [id number] Arrival date & time: 11/10/22  0808    HISTORY   Chief Complaint  Patient presents with   Finger Injury    Entered by patient   HPI [name] is a pleasant, 51 y.o. female who presents to urgent care today. Patient states that a week ago she slammed her right hand in a car door which caused pain but did not limit her range of motion.  Patient states 3 days ago she fell landing on her right hand.  Patient states she has been taking Tylenol and applying ice to the area but now has bruising and swelling at the base of her right thumb.  The history is provided by the patient.   Past Medical History:  Diagnosis Date   Abnormal uterine bleeding (AUB) 10/11/2015   ADD (attention deficit disorder)    Arthritis    oa   COVID feb 16th or feb 17th   01-29-21 or 2-17-2022rapid home test cold symptoms x 4 days all symptoms resolved   COVID 04/04/2021   epic, asymptomatic   Elevated TSH since jan 2022   thryoid med adjusted see dr Loanne Drilling 05-02-2021   Fatty liver    no problems in several yrs as of 04-02-21 per pt   GERD (gastroesophageal reflux disease)    Heavy periods    Hypertension    Hypothyroidism    Sleep apnea    cpap   Thyroid nodule    US done 2013 sees dr Ebony Hail may 20th 2022 for not sure which side no swallowing problems   Vitamin D deficiency    Patient Active Problem List   Diagnosis Date Noted   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   Morbid obesity ([place]) 06/23/2021   Postoperative state 06/10/2021   Preop cardiovascular exam 05/27/2021   NSVT (nonsustained ventricular tachycardia) ([place]) 05/27/2021   OSA (obstructive sleep apnea) 04/18/2021   Multinodular goiter 04/11/2021   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/27/2018   Unilateral primary osteoarthritis, left knee 11/01/2018   Primary osteoarthritis of left knee 11/15/2017   Dense breast tissue on mammogram 10/21/2016   HLD (hyperlipidemia) 10/21/2016   LVH  (left ventricular hypertrophy) 10/21/2016   Mitral regurgitation 10/21/2016   Abnormal uterine bleeding (AUB) 10/11/2015   Hypertension 10/01/2015   Menorrhagia with regular cycle 06/26/2014   ADD (attention deficit disorder) 02/18/2012   Hypothyroidism 12/30/2011   Past Surgical History:  Procedure Laterality Date   CYSTOSCOPY  06/10/2021   Procedure: CYSTOSCOPY;  Surgeon: Princess Bruins, MD;  Location: [place];  Service: Gynecology;;   GASTRIC ROUX-EN-Y N/A 04/21/2022   Procedure: LAPAROSCOPIC ROUX-EN-Y GASTRIC BYPASS WITH UPPER ENDOSCOPY SLIDING HIATAL HERNIA REPAIR;  Surgeon: Greer Pickerel, MD;  Location: [facility];  Service: General;  Laterality: N/A;   JOINT REPLACEMENT     ROBOTIC ASSISTED TOTAL HYSTERECTOMY WITH BILATERAL SALPINGO OOPHERECTOMY Bilateral 06/10/2021   Procedure: XI ROBOTIC ASSISTED TOTAL LAPAROSCOPIC HYSTERECTOMY WITH BILATERAL SALPINGECTOMY, REPAIR OF LEFT SMALL LABIA MINORA TEAR;  Surgeon: Princess Bruins, MD;  Location: [place];  Service: Gynecology;  Laterality: Bilateral;   tooth implant  2016   TOTAL KNEE ARTHROPLASTY Left 12/27/2018   Procedure: LEFT TOTAL KNEE ARTHROPLASTY;  Surgeon: Mcarthur Rossetti, MD;  Location: [place];  Service: Orthopedics;  Laterality: Left;   TUBAL LIGATION  ~2000   WISDOM TOOTH EXTRACTION  age 28   OB History     Gravida  3   Para  2   Term  2   Preterm      AB  1   Living  2      SAB      IAB  1   Ectopic      Multiple      Live Births             Home Medications    Prior to Admission medications   Medication Sig Start Date End Date Taking? Authorizing Provider  amoxicillin (AMOXIL) 500 MG capsule 1 two times a day 05/13/22     Cholecalciferol (VITAMIN D3 PO) Take 2,000 Units by mouth daily.    [provider]  cyclobenzaprine (FLEXERIL) 10 MG tablet Take 1 tablet (10 mg total) by mouth once daily as needed. 08/25/22     doxycycline (VIBRAMYCIN) 100  MG capsule Take 1 capsule by mouth Once a day for 7 days 10/09/22     escitalopram (LEXAPRO) 10 MG tablet Take 1 tablet (10 mg total) by mouth every morning. 06/22/22     estradiol (VIVELLE-DOT) 0.075 MG/24HR Place 1 patch onto the skin 2 (two) times a week. 01/19/22   [name], NP  fluticasone (FLONASE) 50 MCG/ACT nasal spray Place 1-2 sprays into both nostrils daily as needed for allergies.    [provider]  levothyroxine (SYNTHROID) 150 MCG tablet Take 1 tablet (150 mcg total) by mouth daily before breakfast. 12/24/21     methocarbamol (ROBAXIN) 500 MG tablet Take 1 tablet (500 mg total) by mouth daily as needed. 09/17/22     Multiple Vitamins-Minerals (BARIATRIC FUSION PO) Take 2 tablets by mouth daily.    [provider]  ondansetron (ZOFRAN-ODT) 4 MG disintegrating tablet Take 1 tablet (4 mg total) by mouth every 6 (six) hours as needed for nausea or vomiting. 04/22/22   Greer Pickerel, MD  pantoprazole (PROTONIX) 40 MG tablet Take 1 tablet (40 mg total) by mouth daily. 04/22/22   Greer Pickerel, MD  traMADol (ULTRAM) 50 MG tablet Take 1 tablet (50 mg total) by mouth every 6 (six) hours as needed (pain). 04/22/22   Greer Pickerel, MD  valsartan-hydrochlorothiazide (DIOVAN-HCT) 320-25 MG tablet Take 1 tablet by mouth daily. 06/22/22       Family History Family History  Problem Relation Age of Onset   Other Son        hx/o guillian barre   Hearing loss Father    Heart disease Maternal Grandmother    Heart disease Maternal Grandfather    Stroke Paternal Grandmother    Stroke Paternal Grandfather    Cancer Neg Hx    Diabetes Neg Hx    Breast cancer Neg Hx    Thyroid disease Neg Hx    Social History Social History   Tobacco Use   Smoking status: Never   Smokeless tobacco: Never  Vaping Use   Vaping Use: Never used  Substance Use Topics   Alcohol use: Yes    Alcohol/week: 1.0 standard drink of alcohol    Types: 1 Glasses of wine per week    Comment: rarely    Drug use: No   Allergies   Patient has no known allergies.  Review of Systems Review of Systems Pertinent findings revealed after performing a 14 point review of systems has been noted in the history of present illness.  Physical Exam Triage Vital Signs ED Triage Vitals  Enc Vitals Group     BP 10/10/21 0827 (!) 147/82     Pulse Rate  10/10/21 0827 72     Resp 10/10/21 0827 18     Temp 10/10/21 0827 98.3 F (36.8 C)     Temp Source 10/10/21 0827 Oral     SpO2 10/10/21 0827 98 %     Weight --      Height --      Head Circumference --      Peak Flow --      Pain Score 10/10/21 0826 5     Pain Loc --      Pain Edu? --      Excl. in [place]? --    Updated Vital Signs BP (!) 129/91 (BP Location: Left Arm)   Pulse 75   Temp 97.9 F (36.6 C) (Oral)   Resp 16   LMP 03/14/2021   SpO2 97%   Physical Exam Vitals and nursing note reviewed.  Constitutional:      General: She is not in acute distress.    Appearance: Normal appearance.  HENT:     Head: Normocephalic and atraumatic.  Eyes:     Pupils: Pupils are equal, round, and reactive to light.  Cardiovascular:     Rate and Rhythm: Normal rate and regular rhythm.  Pulmonary:     Effort: Pulmonary effort is normal.     Breath sounds: Normal breath sounds.  Musculoskeletal:        General: Normal range of motion.       Hands:     Cervical back: Normal range of motion and neck supple.     Comments: Mild swelling and ecchymoses of right thumb and posterior aspect of right hand at base of thumb  Skin:    General: Skin is warm and dry.  Neurological:     General: No focal deficit present.     Mental Status: She is alert and oriented to person, place, and time. Mental status is at baseline.  Psychiatric:        Mood and Affect: Mood normal.        Behavior: Behavior normal.        Thought Content: Thought content normal.        Judgment: Judgment normal.     UC Couse / Diagnostics / Procedures:     Radiology DG Hand  Complete Right  Result Date: 11/10/2022 CLINICAL DATA:  Fall on right hand with pain at the base of the thumb EXAM: RIGHT HAND - COMPLETE 3 VIEW COMPARISON:  None Available. FINDINGS: There is no evidence of fracture or dislocation. There is no evidence of arthropathy or other focal bone abnormality. Soft tissues are unremarkable. IMPRESSION: No acute fracture or dislocation. Electronically Signed   By: Darrin Nipper M.D.   On: 11/10/2022 08:45    Procedures Procedures (including critical care time) EKG  Pending results:  Labs Reviewed - No data to display  Medications Ordered in UC: Medications - No data to display  UC Diagnoses / Final Clinical Impressions(s)   I have reviewed the triage vital signs and the nursing notes.  Pertinent labs & imaging results that were available during my care of the patient were reviewed by me and considered in my medical decision making (see chart for details).    Final diagnoses:  Sprain of metacarpophalangeal (MCP) joint of right thumb, initial encounter   Patient provided with a thumb spica splint for possible sprain of her first MCP.  Ibuprofen and ice recommended to help reduce pain and swelling.  Patient advised to follow-up with orthopedics in  the next 7 to 10 days if no improvement.  ED Prescriptions   None    PDMP not reviewed this encounter.  Discharge Instructions:   Discharge Instructions      I am happy to tell you that your right thumb, hand and wrist are not broken.  Please wear the thumb spica splint that we have provided to you during your visit today is much as possible for the next 6 weeks for a presumed sprain of your right thumb.  In the next 7 to 10 days, if you do not have meaningful improvement of your pain and thumb strength, consider following up with hand specialist for further evaluation and treatment.  You are welcome to continue taking ibuprofen and/or naproxen and applying ice to your hand as needed to reduce pain  and swelling.  Thank you for visiting urgent care today.      Disposition Upon Discharge:  Condition: stable for discharge home Home: take medications as prescribed; routine discharge instructions as discussed; follow up as advised.  Patient presented with an acute illness with associated systemic symptoms and significant discomfort requiring urgent management. In my opinion, this is a condition that a prudent lay person (someone who possesses an average knowledge of health and medicine) may potentially expect to result in complications if not addressed urgently such as respiratory distress, impairment of bodily function or dysfunction of bodily organs.   Routine symptom specific, illness specific and/or disease specific instructions were discussed with the patient and/or caregiver at length.   As such, the patient has been evaluated and assessed, work-up was performed and treatment was provided in alignment with urgent care protocols and evidence based medicine.  Patient/parent/caregiver has been advised that the patient may require follow up for further testing and treatment if the symptoms continue in spite of treatment, as clinically indicated and appropriate.  Patient/parent/caregiver has been advised to report to orthopedic urgent care clinic or return to the [facility] or PCP in 3-5 days if no better; follow-up with orthopedics, PCP or the Emergency Department if new signs and symptoms develop or if the current signs or symptoms continue to change or worsen for further workup, evaluation and treatment as clinically indicated and appropriate  The patient will follow up with their current PCP if and as advised. If the patient does not currently have a PCP we will have assisted them in obtaining one.   The patient may need specialty follow up if the symptoms continue, in spite of conservative treatment and management, for further workup, evaluation, consultation and treatment as clinically  indicated and appropriate.  Patient/parent/caregiver verbalized understanding and agreement of plan as discussed.  All questions were addressed during visit.  Please see discharge instructions below for further details of plan.  This office note has been dictated using Museum/gallery curator.  Unfortunately, this method of dictation can sometimes lead to typographical or grammatical errors.  I apologize for your inconvenience in advance if this occurs.  Please do not hesitate to reach out to me if clarification is needed.      Lynden Oxford Scales, PA-C 11/10/22 1108

## 2022-11-23 ENCOUNTER — Encounter: Payer: Self-pay | Admitting: *Deleted

## 2022-12-08 ENCOUNTER — Ambulatory Visit: Payer: No Typology Code available for payment source | Admitting: Internal Medicine

## 2022-12-11 ENCOUNTER — Ambulatory Visit: Payer: No Typology Code available for payment source

## 2022-12-18 ENCOUNTER — Other Ambulatory Visit (HOSPITAL_COMMUNITY): Payer: Self-pay

## 2022-12-25 ENCOUNTER — Ambulatory Visit: Payer: No Typology Code available for payment source | Admitting: Adult Health

## 2022-12-31 DIAGNOSIS — M79644 Pain in right finger(s): Secondary | ICD-10-CM | POA: Diagnosis not present

## 2023-01-04 ENCOUNTER — Other Ambulatory Visit (HOSPITAL_BASED_OUTPATIENT_CLINIC_OR_DEPARTMENT_OTHER): Payer: Self-pay

## 2023-01-04 MED ORDER — LEVOTHYROXINE SODIUM 150 MCG PO TABS
150.0000 ug | ORAL_TABLET | Freq: Every day | ORAL | 12 refills | Status: DC
  Filled 2023-01-04: qty 90, 90d supply, fill #0
  Filled 2023-05-17: qty 90, 90d supply, fill #1
  Filled 2023-09-14: qty 90, 90d supply, fill #2
  Filled 2023-12-09: qty 90, 90d supply, fill #3

## 2023-01-05 ENCOUNTER — Other Ambulatory Visit (HOSPITAL_BASED_OUTPATIENT_CLINIC_OR_DEPARTMENT_OTHER): Payer: Self-pay

## 2023-01-05 ENCOUNTER — Encounter: Payer: Self-pay | Admitting: Skilled Nursing Facility1

## 2023-01-05 ENCOUNTER — Encounter: Payer: Commercial Managed Care - PPO | Attending: General Surgery | Admitting: Skilled Nursing Facility1

## 2023-01-05 VITALS — Ht 66.0 in | Wt 176.3 lb

## 2023-01-05 DIAGNOSIS — E631 Imbalance of constituents of food intake: Secondary | ICD-10-CM

## 2023-01-05 NOTE — Progress Notes (Signed)
Bariatric Nutrition Follow-Up Visit Medical Nutrition Therapy   NUTRITION ASSESSMENT   Surgery date: 04/21/2022 Surgery type: RYGB  Anthropometrics  Start weight at [facility]: 243.8 lbs (date: 02/05/2022)  Height: 66.5 in  Wt today: 176.3 pounds  Clinical  Medical hx: anxiety, HTN, GERD Medications: see list  Labs: cholesterol 217, HDL 32, Triglycerides 275, LDL 143, A1C 6.5, Creatinine 1.22 Notable signs/symptoms: N/A Any previous deficiencies? Yes: iron, vitamin D   Body Composition Scale 05/05/2022 06/02/2022 01/05/2023  Current Body Weight 229.0 210.8 176.3  Total Body Fat % 42.6 41.2 34.6  Visceral Fat '14 12 9  '$ Fat-Free Mass % 57.3 58.7 65.3   Total Body Water % 43.1 43.8 47.1  Muscle-Mass lbs 32.2 31 31.6  BMI 36.9 33.9 28.3  Body Fat Displacement            Torso  lbs 60.5 53.8 37.6         Left Leg  lbs 12.1 10.7 7.5         Right Leg  lbs 12.1 10.7 7.5         Left Arm  lbs 6.0 5.3 3.7         Right Arm   lbs 6.0 5.3 3.7     Lifestyle & Dietary Hx  Pt is doing very well!  Pt states her father is sick in hospice care.  Pt states she is thrilled where she is and feels really good about her success. Pt states she will finish school in 2 weeks. 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  Pt states she realizes she needs to workout now and will when she is done with school stating she plans to do more hiking.    Estimated daily fluid intake: 64 oz Estimated daily protein intake: 80+ g Supplements: multi and calcium Current average weekly physical activity: walking 1 mile   24-Hr Dietary Recall First Meal 8:30-9: egg and yogurt + berries or just yogurt and fruit Snack:  banana or cutie Second Meal: leftovers: half cheeseburger Snack: grapes + cheese or nuts Third Meal: protein shake + fruit  Snack: fruit Beverages: hot tea + stevia, coconut water, plain water, coffee + stevia and milk  Post-Op Goals/ Signs/ Symptoms Using  straws: no Drinking while eating: no Chewing/swallowing difficulties: no Changes in vision: no Changes to mood/headaches: no Hair loss/changes to skin/nails: no Difficulty focusing/concentrating: no Sweating: no Limb weakness: no Dizziness/lightheadedness: no Palpitations: no  Carbonated/caffeinated beverages: no N/V/D/C/Gas: no Abdominal pain: no Dumping syndrome: no    NUTRITION DIAGNOSIS  Overweight/obesity ([place]-3.3) related to past poor dietary habits and physical inactivity as evidenced by completed bariatric surgery and following dietary guidelines for continued weight loss and healthy nutrition status.     NUTRITION INTERVENTION: continued  Nutrition counseling (C-1) and education (E-2) to facilitate bariatric surgery goals, including: The importance of consuming adequate calories as well as certain nutrients daily due to the body's need for essential vitamins, minerals, and fats The importance of daily physical activity and to reach a goal of at least 150 minutes of moderate to vigorous physical activity weekly (or as directed by their physician) due to benefits such as increased musculature and improved lab values The importance of intuitive eating specifically learning hunger-satiety cues and understanding the importance of learning a new body: The importance of mindful eating to avoid grazing behaviors  Encouraged patient to honor their body's internal hunger and fullness cues.  Throughout the day, check  in mentally and rate hunger. Stop eating when satisfied not full regardless of how much food is left on the plate.  Get more if still hungry 20-30 minutes later.  The key is to honor satisfaction so throughout the meal, rate fullness factor and stop when comfortably satisfied not physically full. The key is to honor hunger and fullness without any feelings of guilt or shame.  Pay attention to what the internal cues are, rather than any external factors. This will enhance the  confidence you have in listening to your own body and following those internal cues enabling you to increase how often you eat when you are hungry not out of appetite and stop when you are satisfied not full.  Encouraged pt to continue to eat balanced meals inclusive of non starchy vegetables 2 times a day 7 days a week Encouraged pt to choose lean protein sources: limiting beef, pork, sausage, hotdogs, and lunch meat Encourage pt to choose healthy fats such as plant based limiting animal fats Encouraged pt to continue to drink a minium 64 fluid ounces with half being plain water to satisfy proper hydration    Learning Style & Readiness for Change Teaching method utilized: Visual & Auditory  Demonstrated degree of understanding via: Teach Back  Readiness Level: action Barriers to learning/adherence to lifestyle change: non identified  RD's Notes for Next Visit Assess adherence to pt chosen goals    MONITORING & EVALUATION Dietary intake, weekly physical activity, body weight  Next Steps Patient is to follow-up in May

## 2023-01-18 ENCOUNTER — Ambulatory Visit: Payer: Commercial Managed Care - PPO | Admitting: Physician Assistant

## 2023-01-18 ENCOUNTER — Other Ambulatory Visit (INDEPENDENT_AMBULATORY_CARE_PROVIDER_SITE_OTHER): Payer: Commercial Managed Care - PPO

## 2023-01-18 ENCOUNTER — Encounter: Payer: Self-pay | Admitting: Physician Assistant

## 2023-01-18 VITALS — BP 122/80 | HR 68 | Ht 66.0 in | Wt 174.0 lb

## 2023-01-18 DIAGNOSIS — R7989 Other specified abnormal findings of blood chemistry: Secondary | ICD-10-CM | POA: Diagnosis not present

## 2023-01-18 DIAGNOSIS — K760 Fatty (change of) liver, not elsewhere classified: Secondary | ICD-10-CM | POA: Diagnosis not present

## 2023-01-18 LAB — HEPATIC FUNCTION PANEL
ALT: 20 U/L (ref 0–35)
AST: 22 U/L (ref 0–37)
Albumin: 4.6 g/dL (ref 3.5–5.2)
Alkaline Phosphatase: 96 U/L (ref 39–117)
Bilirubin, Direct: 0.1 mg/dL (ref 0.0–0.3)
Total Bilirubin: 0.5 mg/dL (ref 0.2–1.2)
Total Protein: 7.3 g/dL (ref 6.0–8.3)

## 2023-01-18 NOTE — Progress Notes (Signed)
Addendum: Reviewed and agree with assessment and management plan. Nykerria Macconnell M, MD  

## 2023-01-18 NOTE — Progress Notes (Signed)
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    02/06/2022 patient had a virtual visit for an abnormal barium swallow, at that point assigned to Dr. Hilarie Fredrickson, she was set up for an EGD with dilation and a screening colonoscopy.    02/09/2022 CT of the abdomen pelvis with contrast showed markedly enlarged fatty liver, enlarged spleen.    02/19/2022 EGD and colonoscopy.  EGD with nonobstructing Schatzki's ring dilated to 18 mm with a balloon and mild gastritis.  Biopsy showed mild chronic gastritis.  Colonoscopy with diverticulosis in the sigmoid and descending colon and small internal hemorrhoids and otherwise normal.  Repeat recommended 10 years.    04/10/2022 CMP with elevated LFTs AST 180, ALT 227 and alk phos 140.    04/15/2022 patient had full liver serology workup with abnormalities as listed :hepatitis labs show hepatitis A antibody total reactive.,  ANA positive, ANA nuclear/speckled, 1: 40.  At that point Dr. Hilarie Fredrickson discussed that her ANA was only weakly positive and suspicion for autoimmune hepatitis is low given that her IgG was negative.  Was thought this was likely due to fatty liver and that her weight loss after gastric bypass would help.  Recheck of liver enzymes recommended in 3 months.    04/22/2022 AST 82, ALT 107, normal alk phos.    Today, the patient tells me that she is doing really well, she is lost over 80 pounds since having her gastric bypass and is feeling good.  Denies any new GI complaints or concerns.    Denies fever, chills, nausea, vomiting, change in bowel habits or abdominal pain.  Past Medical History:  Diagnosis Date   Abnormal uterine bleeding (AUB) 10/11/2015   ADD (attention deficit disorder)    Arthritis    oa   COVID feb 16th or feb 17th    01-29-21 or 2-17-2022rapid home test cold symptoms x 4 days all symptoms resolved   COVID 04/04/2021   epic, asymptomatic   Elevated LFTs    Elevated TSH since jan 2022   thryoid med adjusted see dr Loanne Drilling 05-02-2021   Fatty liver    no problems in several yrs as of 04-02-21 per pt   GERD (gastroesophageal reflux disease)    Heavy periods    Hepatic steatosis    Hypertension    Hypothyroidism    Internal hemorrhoids    Schatzki's ring    Sleep apnea    cpap   Thyroid nodule    US done 2013 sees dr Ebony Hail may 20th 2022 for not sure which side no swallowing problems   Uterine fibroid    Vitamin D deficiency     Past Surgical History:  Procedure Laterality Date   CYSTOSCOPY  06/10/2021   Procedure: CYSTOSCOPY;  Surgeon: Princess Bruins, MD;  Location: [facility];  Service: Gynecology;;   GASTRIC ROUX-EN-Y N/A 04/21/2022   Procedure: LAPAROSCOPIC ROUX-EN-Y GASTRIC BYPASS WITH UPPER ENDOSCOPY SLIDING HIATAL HERNIA REPAIR;  Surgeon: Greer Pickerel, MD;  Location: [facility];  Service: General;  Laterality: N/A;   JOINT REPLACEMENT     ROBOTIC ASSISTED TOTAL HYSTERECTOMY WITH BILATERAL SALPINGO OOPHERECTOMY Bilateral 06/10/2021   Procedure: XI ROBOTIC ASSISTED TOTAL LAPAROSCOPIC HYSTERECTOMY WITH BILATERAL SALPINGECTOMY, REPAIR OF LEFT SMALL LABIA MINORA TEAR;  Surgeon: Princess Bruins,  MD;  Location: [facility];  Service: Gynecology;  Laterality: Bilateral;   tooth implant  2016   TOTAL KNEE ARTHROPLASTY Left 12/27/2018   Procedure: LEFT TOTAL KNEE ARTHROPLASTY;  Surgeon: Mcarthur Rossetti, MD;  Location: [facility];  Service: Orthopedics;  Laterality: Left;   TUBAL LIGATION  ~2000   WISDOM TOOTH EXTRACTION  age 72    Current Outpatient Medications  Medication Sig Dispense Refill   Cholecalciferol (VITAMIN D3 PO) Take 2,000 Units by mouth daily.     cyclobenzaprine (FLEXERIL) 10 MG tablet Take 1 tablet (10 mg total) by mouth once daily as needed. 90  tablet 3   escitalopram (LEXAPRO) 10 MG tablet Take 1 tablet (10 mg total) by mouth every morning. 90 tablet 4   estradiol (VIVELLE-DOT) 0.075 MG/24HR Place 1 patch onto the skin 2 (two) times a week. 24 patch 3   fluticasone (FLONASE) 50 MCG/ACT nasal spray Place 1-2 sprays into both nostrils daily as needed for allergies.     levothyroxine (SYNTHROID) 150 MCG tablet Take 1 tablet (150 mcg total) by mouth daily before breakfast. 60 tablet 12   methocarbamol (ROBAXIN) 500 MG tablet Take 1 tablet (500 mg total) by mouth daily as needed. 30 tablet 3   Multiple Vitamins-Minerals (BARIATRIC FUSION PO) Take 2 tablets by mouth daily.     valsartan-hydrochlorothiazide (DIOVAN-HCT) 320-25 MG tablet Take 1 tablet by mouth daily. 90 tablet 4   doxycycline (VIBRAMYCIN) 100 MG capsule Take 1 capsule by mouth Once a day for 7 days 7 capsule 0   No current facility-administered medications for this visit.    Allergies as of 01/18/2023   (No Known Allergies)    Family History  Problem Relation Age of Onset   Hearing loss Father    Prostate cancer Father    Heart disease Maternal Grandmother    Heart disease Maternal Grandfather    Stroke Paternal Grandmother    Stroke Paternal Grandfather    Other Son        hx/o guillian barre   Cancer Neg Hx    Diabetes Neg Hx    Breast cancer Neg Hx    Thyroid disease Neg Hx     Social History   Socioeconomic History   Marital status: Married    Spouse name: Legrand Como   Number of children: 2   Years of education: 14   Highest education level: Not on file  Occupational History   Occupation: Paediatric nurse: [employer]    Comment: [employer]  Tobacco Use   Smoking status: Never   Smokeless tobacco: Never  Vaping Use   Vaping Use: Never used  Substance and Sexual Activity   Alcohol use: Yes    Alcohol/week: 1.0 standard drink of alcohol    Types: 1 Glasses of wine per week    Comment: rarely   Drug use: No   Sexual  activity: Yes    Birth control/protection: Surgical    Comment: tubal lig.-1st intercourse 52yo-More than 5 partners  Other Topics Concern   Not on file  Social History Narrative   Recently married to Revillo   4 children between them   One son at home   Social Determinants of Health   Financial Resource Strain: Not on file  Food Insecurity: Not on file  Transportation Needs: Not on file  Physical Activity: Not on file  Stress: Not on file  Social Connections: Not on file  Intimate Partner Violence: Not on  file    Review of Systems:    Constitutional: No weight loss, fever or chills Cardiovascular: No chest pain Respiratory: No SOB  Gastrointestinal: See HPI and otherwise negative   Physical Exam:  Vital signs: BP 122/80   Pulse 68   Ht '5\' 6"'$  (1.676 m)   Wt 174 lb (78.9 kg)   LMP 03/14/2021   BMI 28.08 kg/m    Constitutional:   Pleasant Caucasian female appears to be in NAD, Well developed, Well nourished, alert and cooperative Respiratory: Respirations even and unlabored. Lungs clear to auscultation bilaterally.   No wheezes, crackles, or rhonchi.  Cardiovascular: Normal S1, S2. No MRG. Regular rate and rhythm. No peripheral edema, cyanosis or pallor.  Gastrointestinal:  Soft, nondistended, nontender. No rebound or guarding. Normal bowel sounds. No appreciable masses or hepatomegaly. Psychiatric: Demonstrates good judgement and reason without abnormal affect or behaviors.  RELEVANT LABS AND IMAGING: CBC    Component Value Date/Time   WBC 9.2 04/22/2022 0418   RBC 4.01 04/22/2022 0418   HGB 12.2 04/22/2022 0418   HGB 10.4 (L) 07/14/2018 1141   HCT 36.7 04/22/2022 0418   HCT 34.4 07/14/2018 1141   PLT 215 04/22/2022 0418   PLT 335 10/11/2015 1323   MCV 91.5 04/22/2022 0418   MCV 78 (L) 07/14/2018 1141   MCH 30.4 04/22/2022 0418   MCHC 33.2 04/22/2022 0418   RDW 13.4 04/22/2022 0418   RDW 19.0 (H) 07/14/2018 1141   LYMPHSABS 0.9 04/22/2022 0418   LYMPHSABS  2.2 07/14/2018 1141   MONOABS 0.6 04/22/2022 0418   EOSABS 0.0 04/22/2022 0418   EOSABS 0.2 07/14/2018 1141   BASOSABS 0.0 04/22/2022 0418   BASOSABS 0.1 07/14/2018 1141    CMP     Component Value Date/Time   NA 138 04/22/2022 0811   NA 141 07/14/2018 1141   K 3.7 04/22/2022 0811   CL 108 04/22/2022 0811   CO2 22 04/22/2022 0811   GLUCOSE 139 (H) 04/22/2022 0811   BUN 25 (H) 04/22/2022 0811   BUN 11 07/14/2018 1141   CREATININE 0.90 04/22/2022 0811   CREATININE 1.22 (H) 01/19/2022 0847   CALCIUM 8.7 (L) 04/22/2022 0811   PROT 7.1 04/22/2022 0811   PROT 7.2 07/14/2018 1141   ALBUMIN 4.1 04/22/2022 0811   ALBUMIN 4.4 07/14/2018 1141   AST 82 (H) 04/22/2022 0811   ALT 107 (H) 04/22/2022 0811   ALKPHOS 111 04/22/2022 0811   BILITOT 0.6 04/22/2022 0811   BILITOT <0.2 07/14/2018 1141   GFRNONAA >60 04/22/2022 0811   GFRAA >60 12/28/2018 0127    Assessment: 1.  Elevated LFTs: Last checked in May 2023, thought related to fatty liver, now status post gastric bypass, hopefully they have improved 2.  History of gastric bypass in 2023  Plan: 1.  Recheck hepatic function panel today 2.  Patient to follow-up pending results from above.  Ellouise Newer, PA-C [facility] 01/18/2023, 8:34 AM  Cc: Asencion Noble, MD

## 2023-01-18 NOTE — Patient Instructions (Signed)
Your provider has requested that you go to the basement level for lab work before leaving today. Press "B" on the elevator. The lab is located at the first door on the left as you exit the elevator.   Follow up as needed  _______________________________________________________  If your blood pressure at your visit was 140/90 or greater, please contact your primary care physician to follow up on this.  _______________________________________________________  If you are age 53 or older, your body mass index should be between 23-30. Your Body mass index is 28.08 kg/m. If this is out of the aforementioned range listed, please consider follow up with your Primary Care Provider.  If you are age 59 or younger, your body mass index should be between 19-25. Your Body mass index is 28.08 kg/m. If this is out of the aformentioned range listed, please consider follow up with your Primary Care Provider.   ________________________________________________________  The [facility] providers would like to encourage you to use [facility] to communicate with providers for non-urgent requests or questions.  Due to long hold times on the telephone, sending your provider a message by [facility] may be a faster and more efficient way to get a response.  Please allow 48 business hours for a response.  Please remember that this is for non-urgent requests.   Due to recent changes in healthcare laws, you may see the results of your imaging and laboratory studies on MyChart before your provider has had a chance to review them.  We understand that in some cases there may be results that are confusing or concerning to you. Not all laboratory results come back in the same time frame and the provider may be waiting for multiple results in order to interpret others.  Please give [place] 48 hours in order for your provider to thoroughly review all the results before contacting the office for clarification of your results.    Thank you for  entrusting me with your care and choosing [facility].  Ellouise Newer PA-C

## 2023-01-26 ENCOUNTER — Other Ambulatory Visit: Payer: Self-pay

## 2023-01-26 ENCOUNTER — Other Ambulatory Visit (HOSPITAL_BASED_OUTPATIENT_CLINIC_OR_DEPARTMENT_OTHER): Payer: Self-pay

## 2023-03-25 ENCOUNTER — Encounter: Payer: Self-pay | Admitting: Nurse Practitioner

## 2023-03-31 ENCOUNTER — Ambulatory Visit: Payer: 59 | Admitting: Nurse Practitioner

## 2023-04-21 ENCOUNTER — Other Ambulatory Visit (HOSPITAL_BASED_OUTPATIENT_CLINIC_OR_DEPARTMENT_OTHER): Payer: Self-pay

## 2023-04-21 ENCOUNTER — Other Ambulatory Visit: Payer: Self-pay | Admitting: Nurse Practitioner

## 2023-04-21 ENCOUNTER — Other Ambulatory Visit: Payer: Self-pay

## 2023-04-21 DIAGNOSIS — Z79890 Hormone replacement therapy (postmenopausal): Secondary | ICD-10-CM

## 2023-04-21 MED ORDER — ESTRADIOL 0.075 MG/24HR TD PTTW
1.0000 | MEDICATED_PATCH | TRANSDERMAL | 0 refills | Status: DC
  Filled 2023-04-21: qty 24, 84d supply, fill #0

## 2023-04-21 NOTE — Telephone Encounter (Signed)
Last AEX 01/19/22 with TW AEX scheduled 07/20/2023 with TW  11/09/2022 MMG neg

## 2023-05-03 ENCOUNTER — Ambulatory Visit: Payer: Commercial Managed Care - PPO | Admitting: Skilled Nursing Facility1

## 2023-05-17 ENCOUNTER — Other Ambulatory Visit (HOSPITAL_BASED_OUTPATIENT_CLINIC_OR_DEPARTMENT_OTHER): Payer: Self-pay

## 2023-06-23 ENCOUNTER — Other Ambulatory Visit: Payer: Self-pay | Admitting: Oncology

## 2023-06-23 DIAGNOSIS — Z006 Encounter for examination for normal comparison and control in clinical research program: Secondary | ICD-10-CM

## 2023-07-05 DIAGNOSIS — L562 Photocontact dermatitis [berloque dermatitis]: Secondary | ICD-10-CM | POA: Diagnosis not present

## 2023-07-19 NOTE — Progress Notes (Deleted)
   [name] October 25, 1971 [id number]   History:  52 y.o. [id number] presents for annual exam. S/P June 2022 robotic TLH with bilateral salpingectomies for menorrhagia s/t fibroids. On ERT. Normal pap and mammogram history. HTN managed by PCP, hypothyroidism managed by endocrinology. 04/2022 gastric sleeve surgery.   Gynecologic History Patient's last menstrual period was 03/14/2021.   Contraception/Family planning: status post hysterectomy Sexually active: Yes  Health Maintenance Last Pap: 12/27/2019. Results were: Normal Last mammogram: 11/09/2022. Results were: Normal Last colonoscopy: 02/19/2022. Results were: Normal, 10-year recall Last Dexa: Not indicated  Past medical history, past surgical history, family history and social history were all reviewed and documented in the EPIC chart. Married. 2 children. 3 grandchildren ages 2 months, 1 year, and almost 3 years. Works remote for [employer].   ROS:  A ROS was performed and pertinent positives and negatives are included.  Exam:  There were no vitals filed for this visit.  There is no height or weight on file to calculate BMI.  General appearance:  Normal Thyroid:  Symmetrical, normal in size, without palpable masses or nodularity. Respiratory  Auscultation:  Clear without wheezing or rhonchi Cardiovascular  Auscultation:  Regular rate, without rubs, murmurs or gallops  Edema/varicosities:  Not grossly evident Abdominal  Soft,nontender, without masses, guarding or rebound.  Liver/spleen:  No organomegaly noted  Hernia:  None appreciated  Skin  Inspection:  Grossly normal Breasts: Examined lying and sitting.   Right: Without masses, retractions, nipple discharge or axillary adenopathy.   Left: Without masses, retractions, nipple discharge or axillary adenopathy. Genitourinary   Inguinal/mons:  Normal without inguinal adenopathy  External genitalia:  Normal appearing vulva with no masses, tenderness, or  lesions  BUS/Urethra/Skene's glands:  Normal  Vagina:  Normal appearing with normal color and discharge, no lesions  Cervix:  Absent  Uterus:  Absent  Adnexa/parametria:     Rt: Normal in size, without masses or tenderness.   Lt: Normal in size, without masses or tenderness.  Anus and perineum: Normal  Digital rectal exam: Not indicated  Patient informed chaperone available to be present for breast and pelvic exam. Patient has requested no chaperone to be present. Patient has been advised what will be completed during breast and pelvic exam.   Assessment/Plan:  52 y.o. [id number] for annual exam.   Well female exam with routine gynecological exam - Plan: CBC with Differential/Platelet, Comprehensive metabolic panel, TSH, Lipid panel. Education provided on SBEs, importance of preventative screenings, current guidelines, high calcium diet, regular exercise, and multivitamin daily.   Hormone replacement therapy - Plan: estradiol (VIVELLE-DOT) 0.075 MG/24HR twice weekly. She is aware of risk of blood clots, heart attack, stroke, and breast cancer and benefits of heart health, bone health, and symptom management.   Screening for cervical cancer - Normal Pap history. No longer screening per guidelines.   Screening for breast cancer - Normal mammogram history.  Continue annual screenings.  Normal breast exam today.   Screening for colon cancer - Colonoscopy 02/2022. Will repeat at 10-year interval per GI's recommendation.   Return in 1 year for annual.     Olivia Mackie DNP, 2:54 PM 07/19/2023

## 2023-07-20 ENCOUNTER — Ambulatory Visit: Payer: Commercial Managed Care - PPO | Admitting: Nurse Practitioner

## 2023-07-20 DIAGNOSIS — Z79890 Hormone replacement therapy (postmenopausal): Secondary | ICD-10-CM

## 2023-07-20 DIAGNOSIS — M9905 Segmental and somatic dysfunction of pelvic region: Secondary | ICD-10-CM | POA: Diagnosis not present

## 2023-07-20 DIAGNOSIS — M50322 Other cervical disc degeneration at C5-C6 level: Secondary | ICD-10-CM | POA: Diagnosis not present

## 2023-07-20 DIAGNOSIS — M9903 Segmental and somatic dysfunction of lumbar region: Secondary | ICD-10-CM | POA: Diagnosis not present

## 2023-07-20 DIAGNOSIS — M9901 Segmental and somatic dysfunction of cervical region: Secondary | ICD-10-CM | POA: Diagnosis not present

## 2023-07-20 DIAGNOSIS — Z01419 Encounter for gynecological examination (general) (routine) without abnormal findings: Secondary | ICD-10-CM

## 2023-07-27 DIAGNOSIS — M9905 Segmental and somatic dysfunction of pelvic region: Secondary | ICD-10-CM | POA: Diagnosis not present

## 2023-07-27 DIAGNOSIS — M50322 Other cervical disc degeneration at C5-C6 level: Secondary | ICD-10-CM | POA: Diagnosis not present

## 2023-07-27 DIAGNOSIS — M9903 Segmental and somatic dysfunction of lumbar region: Secondary | ICD-10-CM | POA: Diagnosis not present

## 2023-07-27 DIAGNOSIS — M9901 Segmental and somatic dysfunction of cervical region: Secondary | ICD-10-CM | POA: Diagnosis not present

## 2023-07-29 ENCOUNTER — Other Ambulatory Visit: Payer: Self-pay | Admitting: Nurse Practitioner

## 2023-07-29 ENCOUNTER — Other Ambulatory Visit: Payer: Self-pay

## 2023-07-29 ENCOUNTER — Other Ambulatory Visit (HOSPITAL_BASED_OUTPATIENT_CLINIC_OR_DEPARTMENT_OTHER): Payer: Self-pay

## 2023-07-29 DIAGNOSIS — M9905 Segmental and somatic dysfunction of pelvic region: Secondary | ICD-10-CM | POA: Diagnosis not present

## 2023-07-29 DIAGNOSIS — Z79890 Hormone replacement therapy (postmenopausal): Secondary | ICD-10-CM

## 2023-07-29 DIAGNOSIS — M9901 Segmental and somatic dysfunction of cervical region: Secondary | ICD-10-CM | POA: Diagnosis not present

## 2023-07-29 DIAGNOSIS — M9903 Segmental and somatic dysfunction of lumbar region: Secondary | ICD-10-CM | POA: Diagnosis not present

## 2023-07-29 DIAGNOSIS — M50322 Other cervical disc degeneration at C5-C6 level: Secondary | ICD-10-CM | POA: Diagnosis not present

## 2023-07-29 MED ORDER — ESCITALOPRAM OXALATE 10 MG PO TABS
10.0000 mg | ORAL_TABLET | Freq: Every morning | ORAL | 4 refills | Status: DC
  Filled 2023-07-29: qty 90, 90d supply, fill #0
  Filled 2023-11-04: qty 90, 90d supply, fill #1
  Filled 2024-01-30: qty 90, 90d supply, fill #2
  Filled 2024-05-02: qty 90, 90d supply, fill #3

## 2023-07-29 MED ORDER — ESTRADIOL 0.075 MG/24HR TD PTTW
1.0000 | MEDICATED_PATCH | TRANSDERMAL | 0 refills | Status: DC
  Filled 2023-07-29: qty 24, 84d supply, fill #0

## 2023-07-29 NOTE — Telephone Encounter (Signed)
Medication refill request: dotti 0.075mg  Last AEX:  01-19-22 Next AEX: 10-12-23 Last MMG (if hormonal medication request): 11-09-22 birads 1:neg Refill authorized: please approve or deny as appropriate

## 2023-07-31 ENCOUNTER — Other Ambulatory Visit (HOSPITAL_BASED_OUTPATIENT_CLINIC_OR_DEPARTMENT_OTHER): Payer: Self-pay

## 2023-08-20 DIAGNOSIS — M549 Dorsalgia, unspecified: Secondary | ICD-10-CM | POA: Diagnosis not present

## 2023-08-20 DIAGNOSIS — Z6828 Body mass index (BMI) 28.0-28.9, adult: Secondary | ICD-10-CM | POA: Diagnosis not present

## 2023-08-26 ENCOUNTER — Ambulatory Visit: Payer: Commercial Managed Care - PPO | Attending: Neurosurgery

## 2023-08-26 ENCOUNTER — Other Ambulatory Visit: Payer: Self-pay

## 2023-08-26 DIAGNOSIS — M5459 Other low back pain: Secondary | ICD-10-CM | POA: Diagnosis not present

## 2023-08-26 NOTE — Therapy (Signed)
OUTPATIENT PHYSICAL THERAPY THORACOLUMBAR EVALUATION   Patient Name: [name] MRN: [id number] DOB:1971-10-01, 52 y.o., female Today's Date: 08/26/2023  END OF SESSION:  PT End of Session - 08/26/23 1432     Visit Number 1    Number of Visits 12    Date for PT Re-Evaluation 11/12/23    PT Start Time 1433    PT Stop Time 1513    PT Time Calculation (min) 40 min    Activity Tolerance Patient tolerated treatment well    Behavior During Therapy [facility] for tasks assessed/performed             Past Medical History:  Diagnosis Date   Abnormal uterine bleeding (AUB) 10/11/2015   ADD (attention deficit disorder)    Arthritis    oa   COVID feb 16th or feb 17th   01-29-21 or 2-17-2022rapid home test cold symptoms x 4 days all symptoms resolved   COVID 04/04/2021   epic, asymptomatic   Elevated LFTs    Elevated TSH since jan 2022   thryoid med adjusted see dr Everardo All 05-02-2021   Fatty liver    no problems in several yrs as of 04-02-21 per pt   GERD (gastroesophageal reflux disease)    Heavy periods    Hepatic steatosis    Hypertension    Hypothyroidism    Internal hemorrhoids    Schatzki's ring    Sleep apnea    cpap   Thyroid nodule    US done 2013 sees dr Revonda Standard may 20th 2022 for not sure which side no swallowing problems   Uterine fibroid    Vitamin D deficiency    Past Surgical History:  Procedure Laterality Date   CYSTOSCOPY  06/10/2021   Procedure: CYSTOSCOPY;  Surgeon: Genia Del, MD;  Location: [facility];  Service: Gynecology;;   GASTRIC ROUX-EN-Y N/A 04/21/2022   Procedure: LAPAROSCOPIC ROUX-EN-Y GASTRIC BYPASS WITH UPPER ENDOSCOPY SLIDING HIATAL HERNIA REPAIR;  Surgeon: Gaynelle Adu, MD;  Location: [facility];  Service: General;  Laterality: N/A;   JOINT REPLACEMENT     ROBOTIC ASSISTED TOTAL HYSTERECTOMY WITH BILATERAL SALPINGO OOPHERECTOMY Bilateral 06/10/2021   Procedure: XI ROBOTIC ASSISTED TOTAL LAPAROSCOPIC HYSTERECTOMY WITH  BILATERAL SALPINGECTOMY, REPAIR OF LEFT SMALL LABIA MINORA TEAR;  Surgeon: Genia Del, MD;  Location: [facility];  Service: Gynecology;  Laterality: Bilateral;   tooth implant  2016   TOTAL KNEE ARTHROPLASTY Left 12/27/2018   Procedure: LEFT TOTAL KNEE ARTHROPLASTY;  Surgeon: Kathryne Hitch, MD;  Location: [facility];  Service: Orthopedics;  Laterality: Left;   TUBAL LIGATION  ~2000   WISDOM TOOTH EXTRACTION  age 41   Patient Active Problem List   Diagnosis Date Noted   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   Morbid obesity (HCC) 06/23/2021   Postoperative state 06/10/2021   Preop cardiovascular exam 05/27/2021   NSVT (nonsustained ventricular tachycardia) (HCC) 05/27/2021   OSA (obstructive sleep apnea) 04/18/2021   Multinodular goiter 04/11/2021   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/27/2018   Unilateral primary osteoarthritis, left knee 11/01/2018   Primary osteoarthritis of left knee 11/15/2017   Dense breast tissue on mammogram 10/21/2016   HLD (hyperlipidemia) 10/21/2016   LVH (left ventricular hypertrophy) 10/21/2016   Mitral regurgitation 10/21/2016   Abnormal uterine bleeding (AUB) 10/11/2015   Hypertension 10/01/2015   Menorrhagia with regular cycle 06/26/2014   ADD (attention deficit disorder) 02/18/2012   Hypothyroidism 12/30/2011    PCP: Carylon Perches, MD  REFERRING PROVIDER: Hoyt Koch  G, MD   REFERRING DIAG: Dorsalgia, unspecified   Rationale for Evaluation and Treatment: Rehabilitation  THERAPY DIAG:  Other low back pain  ONSET DATE: June 2024  SUBJECTIVE:                                                                                                                                                                                           SUBJECTIVE STATEMENT: Patient reports that she began having back pain in June 2024 with no known cause. She notes that it began to slowly get worse and bother both hips. She feels like it has  begun to ache and feel weak. She has tried massages and stretching, but that has not seemed to help any. She had never had any problems with her back prior to this pain  PERTINENT HISTORY:  Hypertension, osteoarthritis, and ADD  PAIN:  Are you having pain? Yes: NPRS scale: 10/10 Pain location: low back and both hip  Pain description: constant ache  Aggravating factors: bending over, lifting her legs (to put her pants on)  Relieving factors: medication  PRECAUTIONS: None  RED FLAGS: None   WEIGHT BEARING RESTRICTIONS: No  FALLS:  Has patient fallen in last 6 months? No  LIVING ENVIRONMENT: Lives with: lives with their spouse Lives in: House/apartment Stairs: Yes: External: 2 steps; none; step to pattern (due to knee) Has following equipment at home: None  OCCUPATION: Care guide; primarily sitting, works from home  PLOF: Independent  PATIENT GOALS: reduced pain, be able to play with her grandchildren, and return to her activities as walking, pickleball, etc.   NEXT MD VISIT: none  OBJECTIVE:   PATIENT SURVEYS:  FOTO 59.15  SCREENING FOR RED FLAGS: Bowel or bladder incontinence: No Spinal tumors: No Cauda equina syndrome: No Compression fracture: No Abdominal aneurysm: No  COGNITION: Overall cognitive status: Within functional limits for tasks assessed     SENSATION: Patient reports no numbness or tingling  PALPATION: No significant tenderness to palpation  JOINT MOBILITY:  Lumbar: mild hypomobility and painful   LUMBAR ROM:   AROM eval  Flexion 80; low back pain   Extension 18; low back pain   Right lateral flexion 25% limited; "good hurt"   Left lateral flexion 25% limited: "good hurt"  Right rotation 25% limited; low back pain   Left rotation 25% limited; low back pain   (Blank rows = not tested)  LOWER EXTREMITY ROM: WFL for activities assessed   LOWER EXTREMITY MMT:    MMT Right eval Left eval  Hip flexion 4-/5 4-/5  Hip extension     Hip abduction    Hip adduction  Hip internal rotation    Hip external rotation    Knee flexion 4+/5 5/5  Knee extension 5/5 5/5  Ankle dorsiflexion 4+/5 4+/5  Ankle plantarflexion    Ankle inversion    Ankle eversion     (Blank rows = not tested)  LUMBAR SPECIAL TESTS:  Straight leg raise test: Negative  GAIT: Assistive device utilized: None Level of assistance: Complete Independence  TODAY'S TREATMENT:                                                                                                                              DATE:                                     08/26/23 EXERCISE LOG  Exercise Repetitions and Resistance Comments  Glute sets  15 reps w/ 5 second hold  5 second hold; supine and seated  LTR  15 reps each    Single knee to chest  3 reps each             Blank cell = exercise not performed today   PATIENT EDUCATION:  Education details: HEP, POC, healing, prognosis, arthritis, and goals for therapy Person educated: Patient Education method: Explanation Education comprehension: verbalized understanding  HOME EXERCISE PROGRAM: Today's interventions were added to her HEP. She was educated to perform these interventions twice per day. She reported feeling comfortable completing these interventions at home.   ASSESSMENT:  CLINICAL IMPRESSION: Patient is a 52 y.o. female who was seen today for physical therapy evaluation and treatment for subacute low back and bilateral hip pain.  She presented with moderate to high pain severity and irritability with lumbar active range of motion reproducing her familiar pain.  However, this familiar pain did not significantly limit her active range of motion.  Recommend that she continue with skilled physical therapy to address her impairments to return to her prior level of function.  OBJECTIVE IMPAIRMENTS: decreased activity tolerance, decreased mobility, decreased ROM, decreased strength, hypomobility, and pain.    ACTIVITY LIMITATIONS: carrying, lifting, bending, sleeping, dressing, and locomotion level  PARTICIPATION LIMITATIONS: shopping, community activity, and yard work  PERSONAL FACTORS: Time since onset of injury/illness/exacerbation and 3+ comorbidities: Hypertension, osteoarthritis, and ADD  are also affecting patient's functional outcome.   REHAB POTENTIAL: Good  CLINICAL DECISION MAKING: Evolving/moderate complexity  EVALUATION COMPLEXITY: Moderate   GOALS: Goals reviewed with patient? Yes  SHORT TERM GOALS: Target date: 09/16/23  Patient will be independent with her initial HEP. Baseline: Goal status: INITIAL  2.  Patient will be able to complete her daily activities without her familiar pain exceeding 8/10. Baseline:  Goal status: INITIAL  LONG TERM GOALS: Target date: 10/07/23  Patient will be independent with her advanced HEP. Baseline:  Goal status: INITIAL  2.  Patient will be able to complete her daily activities without her familiar pain  exceeding 6/10. Baseline:  Goal status: INITIAL  3.  Patient will report being able to play with her grandchildren without being limited by her familiar pain. Baseline:  Goal status: INITIAL  4.  Patient will be able to demonstrate proper lifting mechanics for a reduced risk of reinjury. Baseline:  Goal status: INITIAL  PLAN:  PT FREQUENCY: 2x/week  PT DURATION: 6 weeks  PLANNED INTERVENTIONS: Therapeutic exercises, Therapeutic activity, Neuromuscular re-education, Balance training, Patient/Family education, Self Care, Joint mobilization, Stair training, Electrical stimulation, Spinal manipulation, Spinal mobilization, Cryotherapy, Moist heat, Traction, Manual therapy, and Re-evaluation.  PLAN FOR NEXT SESSION: NuStep/recumbent bike, lumbar and lower extremity strengthening, review and modify HEP as needed, double knee-to-chest, and modalities as needed   Granville Lewis, PT 08/26/2023, 6:32 PM

## 2023-08-30 ENCOUNTER — Ambulatory Visit: Payer: Commercial Managed Care - PPO

## 2023-08-30 DIAGNOSIS — M5459 Other low back pain: Secondary | ICD-10-CM | POA: Diagnosis not present

## 2023-08-30 NOTE — Therapy (Signed)
OUTPATIENT PHYSICAL THERAPY THORACOLUMBAR TREATMENT   Patient Name: [name] MRN: [id number] DOB:[birth year], 52 y.o., female Today's Date: 08/30/2023  END OF SESSION:  PT End of Session - 08/30/23 0804     Visit Number 2    Number of Visits 12    Date for PT Re-Evaluation 11/12/23    PT Start Time 0800    PT Stop Time 0845    PT Time Calculation (min) 45 min    Activity Tolerance Patient tolerated treatment well    Behavior During Therapy [facility] for tasks assessed/performed              Past Medical History:  Diagnosis Date   Abnormal uterine bleeding (AUB) 10/11/2015   ADD (attention deficit disorder)    Arthritis    oa   COVID feb 16th or feb 17th   01-29-21 or 2-17-2022rapid home test cold symptoms x 4 days all symptoms resolved   COVID 04/04/2021   epic, asymptomatic   Elevated LFTs    Elevated TSH since jan 2022   thryoid med adjusted see dr Everardo All 05-02-2021   Fatty liver    no problems in several yrs as of 04-02-21 per pt   GERD (gastroesophageal reflux disease)    Heavy periods    Hepatic steatosis    Hypertension    Hypothyroidism    Internal hemorrhoids    Schatzki's ring    Sleep apnea    cpap   Thyroid nodule    US done 2013 sees dr Revonda Standard may 20th 2022 for not sure which side no swallowing problems   Uterine fibroid    Vitamin D deficiency    Past Surgical History:  Procedure Laterality Date   CYSTOSCOPY  06/10/2021   Procedure: CYSTOSCOPY;  Surgeon: Genia Del, MD;  Location: [facility];  Service: Gynecology;;   GASTRIC ROUX-EN-Y N/A 04/21/2022   Procedure: LAPAROSCOPIC ROUX-EN-Y GASTRIC BYPASS WITH UPPER ENDOSCOPY SLIDING HIATAL HERNIA REPAIR;  Surgeon: Gaynelle Adu, MD;  Location: [facility];  Service: General;  Laterality: N/A;   JOINT REPLACEMENT     ROBOTIC ASSISTED TOTAL HYSTERECTOMY WITH BILATERAL SALPINGO OOPHERECTOMY Bilateral 06/10/2021   Procedure: XI ROBOTIC ASSISTED TOTAL LAPAROSCOPIC HYSTERECTOMY WITH  BILATERAL SALPINGECTOMY, REPAIR OF LEFT SMALL LABIA MINORA TEAR;  Surgeon: Genia Del, MD;  Location: [facility];  Service: Gynecology;  Laterality: Bilateral;   tooth implant  2016   TOTAL KNEE ARTHROPLASTY Left 12/27/2018   Procedure: LEFT TOTAL KNEE ARTHROPLASTY;  Surgeon: Kathryne Hitch, MD;  Location: [facility];  Service: Orthopedics;  Laterality: Left;   TUBAL LIGATION  ~2000   WISDOM TOOTH EXTRACTION  age 37   Patient Active Problem List   Diagnosis Date Noted   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   Morbid obesity (HCC) 06/23/2021   Postoperative state 06/10/2021   Preop cardiovascular exam 05/27/2021   NSVT (nonsustained ventricular tachycardia) (HCC) 05/27/2021   OSA (obstructive sleep apnea) 04/18/2021   Multinodular goiter 04/11/2021   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/27/2018   Unilateral primary osteoarthritis, left knee 11/01/2018   Primary osteoarthritis of left knee 11/15/2017   Dense breast tissue on mammogram 10/21/2016   HLD (hyperlipidemia) 10/21/2016   LVH (left ventricular hypertrophy) 10/21/2016   Mitral regurgitation 10/21/2016   Abnormal uterine bleeding (AUB) 10/11/2015   Hypertension 10/01/2015   Menorrhagia with regular cycle 06/26/2014   ADD (attention deficit disorder) 02/18/2012   Hypothyroidism 12/30/2011    PCP: Carylon Perches, MD  REFERRING PROVIDER: Maisie Fus,  Coy Saunas, MD   REFERRING DIAG: Dorsalgia, unspecified   Rationale for Evaluation and Treatment: Rehabilitation  THERAPY DIAG:  Other low back pain  ONSET DATE: June 2024  SUBJECTIVE:                                                                                                                                                                                           SUBJECTIVE STATEMENT:  Patient reports that she is hurting today. She notes that it she has been hurting all weekend.   PERTINENT HISTORY:  Hypertension, osteoarthritis, and ADD  PAIN:   Are you having pain? Yes: NPRS scale: 7-8/10 Pain location: low back and both hip  Pain description: constant ache  Aggravating factors: bending over, lifting her legs (to put her pants on)  Relieving factors: medication  PRECAUTIONS: None  RED FLAGS: None   WEIGHT BEARING RESTRICTIONS: No  FALLS:  Has patient fallen in last 6 months? No  LIVING ENVIRONMENT: Lives with: lives with their spouse Lives in: House/apartment Stairs: Yes: External: 2 steps; none; step to pattern (due to knee) Has following equipment at home: None  OCCUPATION: Care guide; primarily sitting, works from home  PLOF: Independent  PATIENT GOALS: reduced pain, be able to play with her grandchildren, and return to her activities as walking, pickleball, etc.   NEXT MD VISIT: none  OBJECTIVE: all objective measures were assessed at his initial evaluation on 08/26/23 unless otherwise noted  PATIENT SURVEYS:  FOTO 59.15  SCREENING FOR RED FLAGS: Bowel or bladder incontinence: No Spinal tumors: No Cauda equina syndrome: No Compression fracture: No Abdominal aneurysm: No  COGNITION: Overall cognitive status: Within functional limits for tasks assessed     SENSATION: Patient reports no numbness or tingling  PALPATION: No significant tenderness to palpation  JOINT MOBILITY:  Lumbar: mild hypomobility and painful   LUMBAR ROM:   AROM eval  Flexion 80; low back pain   Extension 18; low back pain   Right lateral flexion 25% limited; "good hurt"   Left lateral flexion 25% limited: "good hurt"  Right rotation 25% limited; low back pain   Left rotation 25% limited; low back pain   (Blank rows = not tested)  LOWER EXTREMITY ROM: WFL for activities assessed   LOWER EXTREMITY MMT:    MMT Right eval Left eval  Hip flexion 4-/5 4-/5  Hip extension    Hip abduction    Hip adduction    Hip internal rotation    Hip external rotation    Knee flexion 4+/5 5/5  Knee extension 5/5 5/5  Ankle  dorsiflexion 4+/5 4+/5  Ankle plantarflexion  Ankle inversion    Ankle eversion     (Blank rows = not tested)  LUMBAR SPECIAL TESTS:  Straight leg raise test: Negative  GAIT: Assistive device utilized: None Level of assistance: Complete Independence  TODAY'S TREATMENT:                                                                                                                              DATE:                                     08/30/23 EXERCISE LOG  Exercise Repetitions and Resistance Comments  Glute sets  2 minutes w/ 5 second hold   Lower trunk rotation 3 minutes    Single knee to chest  4 x 30 seconds each    Standing open books  5 reps each    Isometric ball press 20 reps w/ 5 second hold    Standing HS stretch  3 x 30 seconds each    Isometric lumbar extension 3 minutes w/ 5 second hold    Blank cell = exercise not performed today  Manual Therapy Soft Tissue Mobilization: left lumbar paraspinals, QL, and gluteals, for reduced pain and tone Joint Mobilizations: L3-5 CPA's, grade I-II                                     08/26/23 EXERCISE LOG  Exercise Repetitions and Resistance Comments  Glute sets  15 reps w/ 5 second hold  5 second hold; supine and seated  LTR  15 reps each    Single knee to chest  3 reps each             Blank cell = exercise not performed today   PATIENT EDUCATION:  Education details: HEP, POC, healing, prognosis, arthritis, and goals for therapy Person educated: Patient Education method: Explanation Education comprehension: verbalized understanding  HOME EXERCISE PROGRAM: [id number]  ASSESSMENT:  CLINICAL IMPRESSION: Patient was introduced to multiple new interventions for lumbar active range of motion and isometric lumbar engagement. She required minimal cueing with today's new interventions for proper exercise performance to avoid aggravating her familiar symptoms. Manual therapy focused on soft tissue mobilization to her left lumbar  paraspinals as this was the most effective at reducing her familiar symptoms. She was provided a handout of her HEP and she reported feeling comfortable with these interventions. She reported feeling "sore, but better" upon the conclusion of treatment. She continues to require skilled physical therapy to address her remaining impairments to return to her prior level of function.   OBJECTIVE IMPAIRMENTS: decreased activity tolerance, decreased mobility, decreased ROM, decreased strength, hypomobility, and pain.   ACTIVITY LIMITATIONS: carrying, lifting, bending, sleeping, dressing, and locomotion level  PARTICIPATION LIMITATIONS: shopping, community activity, and yard work  PERSONAL FACTORS:  Time since onset of injury/illness/exacerbation and 3+ comorbidities: Hypertension, osteoarthritis, and ADD  are also affecting patient's functional outcome.   REHAB POTENTIAL: Good  CLINICAL DECISION MAKING: Evolving/moderate complexity  EVALUATION COMPLEXITY: Moderate   GOALS: Goals reviewed with patient? Yes  SHORT TERM GOALS: Target date: 09/16/23  Patient will be independent with her initial HEP. Baseline: Goal status: INITIAL  2.  Patient will be able to complete her daily activities without her familiar pain exceeding 8/10. Baseline:  Goal status: INITIAL  LONG TERM GOALS: Target date: 10/07/23  Patient will be independent with her advanced HEP. Baseline:  Goal status: INITIAL  2.  Patient will be able to complete her daily activities without her familiar pain exceeding 6/10. Baseline:  Goal status: INITIAL  3.  Patient will report being able to play with her grandchildren without being limited by her familiar pain. Baseline:  Goal status: INITIAL  4.  Patient will be able to demonstrate proper lifting mechanics for a reduced risk of reinjury. Baseline:  Goal status: INITIAL  PLAN:  PT FREQUENCY: 2x/week  PT DURATION: 6 weeks  PLANNED INTERVENTIONS: Therapeutic  exercises, Therapeutic activity, Neuromuscular re-education, Balance training, Patient/Family education, Self Care, Joint mobilization, Stair training, Electrical stimulation, Spinal manipulation, Spinal mobilization, Cryotherapy, Moist heat, Traction, Manual therapy, and Re-evaluation.  PLAN FOR NEXT SESSION: NuStep/recumbent bike, lumbar and lower extremity strengthening, review and modify HEP as needed, double knee-to-chest, and modalities as needed   Granville Lewis, PT 08/30/2023, 9:04 AM

## 2023-08-31 ENCOUNTER — Other Ambulatory Visit (HOSPITAL_BASED_OUTPATIENT_CLINIC_OR_DEPARTMENT_OTHER): Payer: Self-pay

## 2023-08-31 DIAGNOSIS — L719 Rosacea, unspecified: Secondary | ICD-10-CM | POA: Diagnosis not present

## 2023-08-31 DIAGNOSIS — L578 Other skin changes due to chronic exposure to nonionizing radiation: Secondary | ICD-10-CM | POA: Diagnosis not present

## 2023-08-31 DIAGNOSIS — D1801 Hemangioma of skin and subcutaneous tissue: Secondary | ICD-10-CM | POA: Diagnosis not present

## 2023-08-31 DIAGNOSIS — L821 Other seborrheic keratosis: Secondary | ICD-10-CM | POA: Diagnosis not present

## 2023-08-31 DIAGNOSIS — D229 Melanocytic nevi, unspecified: Secondary | ICD-10-CM | POA: Diagnosis not present

## 2023-08-31 DIAGNOSIS — L570 Actinic keratosis: Secondary | ICD-10-CM | POA: Diagnosis not present

## 2023-08-31 DIAGNOSIS — L814 Other melanin hyperpigmentation: Secondary | ICD-10-CM | POA: Diagnosis not present

## 2023-08-31 MED ORDER — FLUOROURACIL 5 % EX CREA
1.0000 | TOPICAL_CREAM | Freq: Two times a day (BID) | CUTANEOUS | 0 refills | Status: AC
  Filled 2023-08-31: qty 40, 30d supply, fill #0

## 2023-09-01 ENCOUNTER — Ambulatory Visit: Payer: Commercial Managed Care - PPO

## 2023-09-01 DIAGNOSIS — M5459 Other low back pain: Secondary | ICD-10-CM

## 2023-09-01 NOTE — Therapy (Signed)
OUTPATIENT PHYSICAL THERAPY THORACOLUMBAR TREATMENT   Patient Name: [name] MRN: [id number] DOB:[birth year], 52 y.o., female Today's Date: 09/01/2023  END OF SESSION:  PT End of Session - 09/01/23 0805     Visit Number 3    Number of Visits 12    Date for PT Re-Evaluation 11/12/23    PT Start Time 0802    PT Stop Time 0843    PT Time Calculation (min) 41 min    Activity Tolerance Patient tolerated treatment well    Behavior During Therapy [facility] for tasks assessed/performed               Past Medical History:  Diagnosis Date   Abnormal uterine bleeding (AUB) 10/11/2015   ADD (attention deficit disorder)    Arthritis    oa   COVID feb 16th or feb 17th   01-29-21 or 2-17-2022rapid home test cold symptoms x 4 days all symptoms resolved   COVID 04/04/2021   epic, asymptomatic   Elevated LFTs    Elevated TSH since jan 2022   thryoid med adjusted see dr Everardo All 05-02-2021   Fatty liver    no problems in several yrs as of 04-02-21 per pt   GERD (gastroesophageal reflux disease)    Heavy periods    Hepatic steatosis    Hypertension    Hypothyroidism    Internal hemorrhoids    Schatzki's ring    Sleep apnea    cpap   Thyroid nodule    US done 2013 sees dr Revonda Standard may 20th 2022 for not sure which side no swallowing problems   Uterine fibroid    Vitamin D deficiency    Past Surgical History:  Procedure Laterality Date   CYSTOSCOPY  06/10/2021   Procedure: CYSTOSCOPY;  Surgeon: Genia Del, MD;  Location: [facility] [place];  Service: Gynecology;;   GASTRIC ROUX-EN-Y N/A 04/21/2022   Procedure: LAPAROSCOPIC ROUX-EN-Y GASTRIC BYPASS WITH UPPER ENDOSCOPY SLIDING HIATAL HERNIA REPAIR;  Surgeon: Gaynelle Adu, MD;  Location: [facility];  Service: General;  Laterality: N/A;   JOINT REPLACEMENT     ROBOTIC ASSISTED TOTAL HYSTERECTOMY WITH BILATERAL SALPINGO OOPHERECTOMY Bilateral 06/10/2021   Procedure: XI ROBOTIC ASSISTED TOTAL LAPAROSCOPIC HYSTERECTOMY WITH  BILATERAL SALPINGECTOMY, REPAIR OF LEFT SMALL LABIA MINORA TEAR;  Surgeon: Genia Del, MD;  Location: [facility];  Service: Gynecology;  Laterality: Bilateral;   tooth implant  2016   TOTAL KNEE ARTHROPLASTY Left 12/27/2018   Procedure: LEFT TOTAL KNEE ARTHROPLASTY;  Surgeon: Kathryne Hitch, MD;  Location: [facility];  Service: Orthopedics;  Laterality: Left;   TUBAL LIGATION  ~2000   WISDOM TOOTH EXTRACTION  age 33   Patient Active Problem List   Diagnosis Date Noted   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   Morbid obesity (HCC) 06/23/2021   Postoperative state 06/10/2021   Preop cardiovascular exam 05/27/2021   NSVT (nonsustained ventricular tachycardia) (HCC) 05/27/2021   OSA (obstructive sleep apnea) 04/18/2021   Multinodular goiter 04/11/2021   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/27/2018   Unilateral primary osteoarthritis, left knee 11/01/2018   Primary osteoarthritis of left knee 11/15/2017   Dense breast tissue on mammogram 10/21/2016   HLD (hyperlipidemia) 10/21/2016   LVH (left ventricular hypertrophy) 10/21/2016   Mitral regurgitation 10/21/2016   Abnormal uterine bleeding (AUB) 10/11/2015   Hypertension 10/01/2015   Menorrhagia with regular cycle 06/26/2014   ADD (attention deficit disorder) 02/18/2012   Hypothyroidism 12/30/2011    PCP: Carylon Perches, MD  REFERRING PROVIDER:  Bedelia Person, MD   REFERRING DIAG: Dorsalgia, unspecified   Rationale for Evaluation and Treatment: Rehabilitation  THERAPY DIAG:  Other low back pain  ONSET DATE: June 2024  SUBJECTIVE:                                                                                                                                                                                           SUBJECTIVE STATEMENT:  Patient reports that she is feeling better today. She notes that she did not have to take any pain medication or muscle relaxer's until last night.   PERTINENT  HISTORY:  Hypertension, osteoarthritis, and ADD  PAIN:  Are you having pain? Yes: NPRS scale: 5/10 Pain location: low back and both hip  Pain description: constant ache  Aggravating factors: bending over, lifting her legs (to put her pants on)  Relieving factors: medication  PRECAUTIONS: None  RED FLAGS: None   WEIGHT BEARING RESTRICTIONS: No  FALLS:  Has patient fallen in last 6 months? No  LIVING ENVIRONMENT: Lives with: lives with their spouse Lives in: House/apartment Stairs: Yes: External: 2 steps; none; step to pattern (due to knee) Has following equipment at home: None  OCCUPATION: Care guide; primarily sitting, works from home  PLOF: Independent  PATIENT GOALS: reduced pain, be able to play with her grandchildren, and return to her activities as walking, pickleball, etc.   NEXT MD VISIT: none  OBJECTIVE: all objective measures were assessed at his initial evaluation on 08/26/23 unless otherwise noted  PATIENT SURVEYS:  FOTO 59.15  SCREENING FOR RED FLAGS: Bowel or bladder incontinence: No Spinal tumors: No Cauda equina syndrome: No Compression fracture: No Abdominal aneurysm: No  COGNITION: Overall cognitive status: Within functional limits for tasks assessed     SENSATION: Patient reports no numbness or tingling  PALPATION: No significant tenderness to palpation  JOINT MOBILITY:  Lumbar: mild hypomobility and painful   LUMBAR ROM:   AROM eval  Flexion 80; low back pain   Extension 18; low back pain   Right lateral flexion 25% limited; "good hurt"   Left lateral flexion 25% limited: "good hurt"  Right rotation 25% limited; low back pain   Left rotation 25% limited; low back pain   (Blank rows = not tested)  LOWER EXTREMITY ROM: WFL for activities assessed   LOWER EXTREMITY MMT:    MMT Right eval Left eval  Hip flexion 4-/5 4-/5  Hip extension    Hip abduction    Hip adduction    Hip internal rotation    Hip external rotation     Knee flexion 4+/5 5/5  Knee extension 5/5  5/5  Ankle dorsiflexion 4+/5 4+/5  Ankle plantarflexion    Ankle inversion    Ankle eversion     (Blank rows = not tested)  LUMBAR SPECIAL TESTS:  Straight leg raise test: Negative  GAIT: Assistive device utilized: None Level of assistance: Complete Independence  TODAY'S TREATMENT:                                                                                                                              DATE:                                     09/01/23 EXERCISE LOG  Exercise Repetitions and Resistance Comments  Nustep  L4 x 7 minutes    Rocker board  4 minutes    Resisted pull down  Blue XTS x 3 minutes    Bridge  2 minutes   Double knee to chest  3 minutes    SLR 2  x 20 reps each    Supine hip ADD isometric 3 minutes w/ 5 second hold    Lower trunk rotation 2.5 minutes   Standing hip extension  20 reps each    Tandem on foam  3 x 30 seconds each  Intermittent UE support    Blank cell = exercise not performed today                                    08/30/23 EXERCISE LOG  Exercise Repetitions and Resistance Comments  Glute sets  2 minutes w/ 5 second hold   Lower trunk rotation 3 minutes    Single knee to chest  4 x 30 seconds each    Standing open books  5 reps each    Isometric ball press 20 reps w/ 5 second hold    Standing HS stretch  3 x 30 seconds each    Isometric lumbar extension 3 minutes w/ 5 second hold    Blank cell = exercise not performed today  Manual Therapy Soft Tissue Mobilization: left lumbar paraspinals, QL, and gluteals, for reduced pain and tone Joint Mobilizations: L3-5 CPA's, grade I-II                                     08/26/23 EXERCISE LOG  Exercise Repetitions and Resistance Comments  Glute sets  15 reps w/ 5 second hold  5 second hold; supine and seated  LTR  15 reps each    Single knee to chest  3 reps each             Blank cell = exercise not performed today   PATIENT EDUCATION:   Education details: HEP, POC, healing, prognosis, arthritis, and goals for therapy  Person educated: Patient Education method: Explanation Education comprehension: verbalized understanding  HOME EXERCISE PROGRAM: [id number]  ASSESSMENT:  CLINICAL IMPRESSION: Patient was introduced to multiple new interventions for improved lumbar mobility and strength. She required minimal cueing with standing hip extension to isolate hip extension and prevent abduction needed to promote posterior chain engagement. She experienced no significant increase in pain or discomfort with any of today's interventions. She reported feeling better upon the conclusion of treatment. She continues to require skilled physical therapy to address her remaining impairments to return to her prior level of function.   OBJECTIVE IMPAIRMENTS: decreased activity tolerance, decreased mobility, decreased ROM, decreased strength, hypomobility, and pain.   ACTIVITY LIMITATIONS: carrying, lifting, bending, sleeping, dressing, and locomotion level  PARTICIPATION LIMITATIONS: shopping, community activity, and yard work  PERSONAL FACTORS: Time since onset of injury/illness/exacerbation and 3+ comorbidities: Hypertension, osteoarthritis, and ADD  are also affecting patient's functional outcome.   REHAB POTENTIAL: Good  CLINICAL DECISION MAKING: Evolving/moderate complexity  EVALUATION COMPLEXITY: Moderate   GOALS: Goals reviewed with patient? Yes  SHORT TERM GOALS: Target date: 09/16/23  Patient will be independent with her initial HEP. Baseline: Goal status: INITIAL  2.  Patient will be able to complete her daily activities without her familiar pain exceeding 8/10. Baseline:  Goal status: INITIAL  LONG TERM GOALS: Target date: 10/07/23  Patient will be independent with her advanced HEP. Baseline:  Goal status: INITIAL  2.  Patient will be able to complete her daily activities without her familiar pain exceeding  6/10. Baseline:  Goal status: INITIAL  3.  Patient will report being able to play with her grandchildren without being limited by her familiar pain. Baseline:  Goal status: INITIAL  4.  Patient will be able to demonstrate proper lifting mechanics for a reduced risk of reinjury. Baseline:  Goal status: INITIAL  PLAN:  PT FREQUENCY: 2x/week  PT DURATION: 6 weeks  PLANNED INTERVENTIONS: Therapeutic exercises, Therapeutic activity, Neuromuscular re-education, Balance training, Patient/Family education, Self Care, Joint mobilization, Stair training, Electrical stimulation, Spinal manipulation, Spinal mobilization, Cryotherapy, Moist heat, Traction, Manual therapy, and Re-evaluation.  PLAN FOR NEXT SESSION: NuStep/recumbent bike, lumbar and lower extremity strengthening, review and modify HEP as needed, double knee-to-chest, and modalities as needed   Granville Lewis, PT 09/01/2023, 8:47 AM

## 2023-09-06 ENCOUNTER — Ambulatory Visit: Payer: Commercial Managed Care - PPO

## 2023-09-08 ENCOUNTER — Ambulatory Visit: Payer: Commercial Managed Care - PPO

## 2023-09-08 DIAGNOSIS — M5459 Other low back pain: Secondary | ICD-10-CM | POA: Diagnosis not present

## 2023-09-08 NOTE — Therapy (Signed)
OUTPATIENT PHYSICAL THERAPY THORACOLUMBAR TREATMENT   Patient Name: [name] MRN: [id number] DOB:[birth year], 52 y.o., female Today's Date: 09/08/2023  END OF SESSION:  PT End of Session - 09/08/23 0801     Visit Number 4    Number of Visits 12    Date for PT Re-Evaluation 11/12/23    PT Start Time 0757    PT Stop Time 0844    PT Time Calculation (min) 47 min    Activity Tolerance Patient tolerated treatment well    Behavior During Therapy [facility] for tasks assessed/performed                Past Medical History:  Diagnosis Date   Abnormal uterine bleeding (AUB) 10/11/2015   ADD (attention deficit disorder)    Arthritis    oa   COVID feb 16th or feb 17th   01-29-21 or 2-17-2022rapid home test cold symptoms x 4 days all symptoms resolved   COVID 04/04/2021   epic, asymptomatic   Elevated LFTs    Elevated TSH since jan 2022   thryoid med adjusted see dr Everardo All 05-02-2021   Fatty liver    no problems in several yrs as of 04-02-21 per pt   GERD (gastroesophageal reflux disease)    Heavy periods    Hepatic steatosis    Hypertension    Hypothyroidism    Internal hemorrhoids    Schatzki's ring    Sleep apnea    cpap   Thyroid nodule    US done 2013 sees dr Revonda Standard may 20th 2022 for not sure which side no swallowing problems   Uterine fibroid    Vitamin D deficiency    Past Surgical History:  Procedure Laterality Date   CYSTOSCOPY  06/10/2021   Procedure: CYSTOSCOPY;  Surgeon: Genia Del, MD;  Location: [facility] [place];  Service: Gynecology;;   GASTRIC ROUX-EN-Y N/A 04/21/2022   Procedure: LAPAROSCOPIC ROUX-EN-Y GASTRIC BYPASS WITH UPPER ENDOSCOPY SLIDING HIATAL HERNIA REPAIR;  Surgeon: Gaynelle Adu, MD;  Location: [facility];  Service: General;  Laterality: N/A;   JOINT REPLACEMENT     ROBOTIC ASSISTED TOTAL HYSTERECTOMY WITH BILATERAL SALPINGO OOPHERECTOMY Bilateral 06/10/2021   Procedure: XI ROBOTIC ASSISTED TOTAL LAPAROSCOPIC HYSTERECTOMY WITH  BILATERAL SALPINGECTOMY, REPAIR OF LEFT SMALL LABIA MINORA TEAR;  Surgeon: Genia Del, MD;  Location: [facility] [place];  Service: Gynecology;  Laterality: Bilateral;   tooth implant  2016   TOTAL KNEE ARTHROPLASTY Left 12/27/2018   Procedure: LEFT TOTAL KNEE ARTHROPLASTY;  Surgeon: Kathryne Hitch, MD;  Location: [facility];  Service: Orthopedics;  Laterality: Left;   TUBAL LIGATION  ~2000   WISDOM TOOTH EXTRACTION  age 89   Patient Active Problem List   Diagnosis Date Noted   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   Morbid obesity (HCC) 06/23/2021   Postoperative state 06/10/2021   Preop cardiovascular exam 05/27/2021   NSVT (nonsustained ventricular tachycardia) (HCC) 05/27/2021   OSA (obstructive sleep apnea) 04/18/2021   Multinodular goiter 04/11/2021   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/27/2018   Unilateral primary osteoarthritis, left knee 11/01/2018   Primary osteoarthritis of left knee 11/15/2017   Dense breast tissue on mammogram 10/21/2016   HLD (hyperlipidemia) 10/21/2016   LVH (left ventricular hypertrophy) 10/21/2016   Mitral regurgitation 10/21/2016   Abnormal uterine bleeding (AUB) 10/11/2015   Hypertension 10/01/2015   Menorrhagia with regular cycle 06/26/2014   ADD (attention deficit disorder) 02/18/2012   Hypothyroidism 12/30/2011    PCP: Carylon Perches, MD  REFERRING  PROVIDER: Bedelia Person, MD   REFERRING DIAG: Dorsalgia, unspecified   Rationale for Evaluation and Treatment: Rehabilitation  THERAPY DIAG:  Other low back pain  ONSET DATE: June 2024  SUBJECTIVE:                                                                                                                                                                                           SUBJECTIVE STATEMENT:  Patient reports that she has been doing her HEP at home and she can tell that it is helping. However, she is hurting a little more on her left side than her right side  today, but it is getting better.   PERTINENT HISTORY:  Hypertension, osteoarthritis, and ADD  PAIN:  Are you having pain? Yes: NPRS scale: 4-5/10 Pain location: low back and both hip  Pain description: constant ache  Aggravating factors: bending over, lifting her legs (to put her pants on)  Relieving factors: medication  PRECAUTIONS: None  RED FLAGS: None   WEIGHT BEARING RESTRICTIONS: No  FALLS:  Has patient fallen in last 6 months? No  LIVING ENVIRONMENT: Lives with: lives with their spouse Lives in: House/apartment Stairs: Yes: External: 2 steps; none; step to pattern (due to knee) Has following equipment at home: None  OCCUPATION: Care guide; primarily sitting, works from home  PLOF: Independent  PATIENT GOALS: reduced pain, be able to play with her grandchildren, and return to her activities as walking, pickleball, etc.   NEXT MD VISIT: none  OBJECTIVE: all objective measures were assessed at his initial evaluation on 08/26/23 unless otherwise noted  PATIENT SURVEYS:  FOTO 59.15  SCREENING FOR RED FLAGS: Bowel or bladder incontinence: No Spinal tumors: No Cauda equina syndrome: No Compression fracture: No Abdominal aneurysm: No  COGNITION: Overall cognitive status: Within functional limits for tasks assessed     SENSATION: Patient reports no numbness or tingling  PALPATION: No significant tenderness to palpation  JOINT MOBILITY:  Lumbar: mild hypomobility and painful   LUMBAR ROM:   AROM eval  Flexion 80; low back pain   Extension 18; low back pain   Right lateral flexion 25% limited; "good hurt"   Left lateral flexion 25% limited: "good hurt"  Right rotation 25% limited; low back pain   Left rotation 25% limited; low back pain   (Blank rows = not tested)  LOWER EXTREMITY ROM: WFL for activities assessed   LOWER EXTREMITY MMT:    MMT Right eval Left eval  Hip flexion 4-/5 4-/5  Hip extension    Hip abduction    Hip adduction    Hip  internal rotation  Hip external rotation    Knee flexion 4+/5 5/5  Knee extension 5/5 5/5  Ankle dorsiflexion 4+/5 4+/5  Ankle plantarflexion    Ankle inversion    Ankle eversion     (Blank rows = not tested)  LUMBAR SPECIAL TESTS:  Straight leg raise test: Negative  GAIT: Assistive device utilized: None Level of assistance: Complete Independence  TODAY'S TREATMENT:                                                                                                                              DATE:                                     09/08/23 EXERCISE LOG  Exercise Repetitions and Resistance Comments  Nustep  L4 x 13 minutes    Piriformis stretch  3 x 30 seconds each BLE   Figure 4 stretch  3 x 30 seconds each Alternating LE  Thomas stretch  3 x 30 seconds each  Alternating LE  Wood chops  25 reps each    Wall squat  2 minutes    Ball roll out  3 minutes    Marching on BOSU (ball up)  25 reps each    BOSU circles 2 minutes CW and CCW    Blank cell = exercise not performed today                                    09/01/23 EXERCISE LOG  Exercise Repetitions and Resistance Comments  Nustep  L4 x 7 minutes    Rocker board  4 minutes    Resisted pull down  Blue XTS x 3 minutes    Bridge  2 minutes   Double knee to chest  3 minutes    SLR 2  x 20 reps each    Supine hip ADD isometric 3 minutes w/ 5 second hold    Lower trunk rotation 2.5 minutes   Standing hip extension  20 reps each    Tandem on foam  3 x 30 seconds each  Intermittent UE support    Blank cell = exercise not performed today                                    08/30/23 EXERCISE LOG  Exercise Repetitions and Resistance Comments  Glute sets  2 minutes w/ 5 second hold   Lower trunk rotation 3 minutes    Single knee to chest  4 x 30 seconds each    Standing open books  5 reps each    Isometric ball press 20 reps w/ 5 second hold    Standing HS stretch  3 x 30 seconds each  Isometric lumbar extension 3  minutes w/ 5 second hold    Blank cell = exercise not performed today  Manual Therapy Soft Tissue Mobilization: left lumbar paraspinals, QL, and gluteals, for reduced pain and tone Joint Mobilizations: L3-5 CPA's, grade I-II    PATIENT EDUCATION:  Education details: HEP, POC, healing, prognosis, arthritis, and goals for therapy Person educated: Patient Education method: Explanation Education comprehension: verbalized understanding  HOME EXERCISE PROGRAM: [id number]  ASSESSMENT:  CLINICAL IMPRESSION: Patient was introduced to a supine figure four and piriformis stretch for reduced pain and improved soft tissue extensibility with good results. She required minimal cueing with today's new interventions for proper positioning. She experienced no increase in her familiar symptoms with any of today's interventions. Her HEP was updated with today's supine interventions. She reported feeling comfortable with these interventions. She reported feeling better upon the conclusion of treatment. She continues to require skilled physical therapy to address her remaining impairments to return to her prior level of function.     OBJECTIVE IMPAIRMENTS: decreased activity tolerance, decreased mobility, decreased ROM, decreased strength, hypomobility, and pain.   ACTIVITY LIMITATIONS: carrying, lifting, bending, sleeping, dressing, and locomotion level  PARTICIPATION LIMITATIONS: shopping, community activity, and yard work  PERSONAL FACTORS: Time since onset of injury/illness/exacerbation and 3+ comorbidities: Hypertension, osteoarthritis, and ADD  are also affecting patient's functional outcome.   REHAB POTENTIAL: Good  CLINICAL DECISION MAKING: Evolving/moderate complexity  EVALUATION COMPLEXITY: Moderate   GOALS: Goals reviewed with patient? Yes  SHORT TERM GOALS: Target date: 09/16/23  Patient will be independent with her initial HEP. Baseline: Goal status: INITIAL  2.  Patient will be able  to complete her daily activities without her familiar pain exceeding 8/10. Baseline:  Goal status: INITIAL  LONG TERM GOALS: Target date: 10/07/23  Patient will be independent with her advanced HEP. Baseline:  Goal status: INITIAL  2.  Patient will be able to complete her daily activities without her familiar pain exceeding 6/10. Baseline:  Goal status: INITIAL  3.  Patient will report being able to play with her grandchildren without being limited by her familiar pain. Baseline:  Goal status: INITIAL  4.  Patient will be able to demonstrate proper lifting mechanics for a reduced risk of reinjury. Baseline:  Goal status: INITIAL  PLAN:  PT FREQUENCY: 2x/week  PT DURATION: 6 weeks  PLANNED INTERVENTIONS: Therapeutic exercises, Therapeutic activity, Neuromuscular re-education, Balance training, Patient/Family education, Self Care, Joint mobilization, Stair training, Electrical stimulation, Spinal manipulation, Spinal mobilization, Cryotherapy, Moist heat, Traction, Manual therapy, and Re-evaluation.  PLAN FOR NEXT SESSION: NuStep/recumbent bike, lumbar and lower extremity strengthening, review and modify HEP as needed, double knee-to-chest, and modalities as needed   Granville Lewis, PT 09/08/2023, 12:50 PM

## 2023-09-10 ENCOUNTER — Ambulatory Visit: Payer: Commercial Managed Care - PPO

## 2023-09-10 DIAGNOSIS — M5459 Other low back pain: Secondary | ICD-10-CM | POA: Diagnosis not present

## 2023-09-10 NOTE — Therapy (Signed)
OUTPATIENT PHYSICAL THERAPY THORACOLUMBAR TREATMENT   Patient Name: [name] MRN: [id number] DOB:1971/09/07, 52 y.o., female Today's Date: 09/10/2023  END OF SESSION:  PT End of Session - 09/10/23 0759     Visit Number 5    Number of Visits 12    Date for PT Re-Evaluation 11/12/23    PT Start Time 0754    PT Stop Time 0845    PT Time Calculation (min) 51 min    Activity Tolerance Patient tolerated treatment well    Behavior During Therapy [facility] for tasks assessed/performed                 Past Medical History:  Diagnosis Date   Abnormal uterine bleeding (AUB) 10/11/2015   ADD (attention deficit disorder)    Arthritis    oa   COVID feb 16th or feb 17th   01-29-21 or 2-17-2022rapid home test cold symptoms x 4 days all symptoms resolved   COVID 04/04/2021   epic, asymptomatic   Elevated LFTs    Elevated TSH since jan 2022   thryoid med adjusted see dr Everardo All 05-02-2021   Fatty liver    no problems in several yrs as of 04-02-21 per pt   GERD (gastroesophageal reflux disease)    Heavy periods    Hepatic steatosis    Hypertension    Hypothyroidism    Internal hemorrhoids    Schatzki's ring    Sleep apnea    cpap   Thyroid nodule    US done 2013 sees dr Revonda Standard may 20th 2022 for not sure which side no swallowing problems   Uterine fibroid    Vitamin D deficiency    Past Surgical History:  Procedure Laterality Date   CYSTOSCOPY  06/10/2021   Procedure: CYSTOSCOPY;  Surgeon: Genia Del, MD;  Location: [facility] [place];  Service: Gynecology;;   GASTRIC ROUX-EN-Y N/A 04/21/2022   Procedure: LAPAROSCOPIC ROUX-EN-Y GASTRIC BYPASS WITH UPPER ENDOSCOPY SLIDING HIATAL HERNIA REPAIR;  Surgeon: Gaynelle Adu, MD;  Location: [facility];  Service: General;  Laterality: N/A;   JOINT REPLACEMENT     ROBOTIC ASSISTED TOTAL HYSTERECTOMY WITH BILATERAL SALPINGO OOPHERECTOMY Bilateral 06/10/2021   Procedure: XI ROBOTIC ASSISTED TOTAL LAPAROSCOPIC HYSTERECTOMY WITH  BILATERAL SALPINGECTOMY, REPAIR OF LEFT SMALL LABIA MINORA TEAR;  Surgeon: Genia Del, MD;  Location: [facility];  Service: Gynecology;  Laterality: Bilateral;   tooth implant  2016   TOTAL KNEE ARTHROPLASTY Left 12/27/2018   Procedure: LEFT TOTAL KNEE ARTHROPLASTY;  Surgeon: Kathryne Hitch, MD;  Location: [facility];  Service: Orthopedics;  Laterality: Left;   TUBAL LIGATION  ~2000   WISDOM TOOTH EXTRACTION  age 55   Patient Active Problem List   Diagnosis Date Noted   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   Morbid obesity (HCC) 06/23/2021   Postoperative state 06/10/2021   Preop cardiovascular exam 05/27/2021   NSVT (nonsustained ventricular tachycardia) (HCC) 05/27/2021   OSA (obstructive sleep apnea) 04/18/2021   Multinodular goiter 04/11/2021   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/27/2018   Unilateral primary osteoarthritis, left knee 11/01/2018   Primary osteoarthritis of left knee 11/15/2017   Dense breast tissue on mammogram 10/21/2016   HLD (hyperlipidemia) 10/21/2016   LVH (left ventricular hypertrophy) 10/21/2016   Mitral regurgitation 10/21/2016   Abnormal uterine bleeding (AUB) 10/11/2015   Hypertension 10/01/2015   Menorrhagia with regular cycle 06/26/2014   ADD (attention deficit disorder) 02/18/2012   Hypothyroidism 12/30/2011    PCP: Carylon Perches, MD  REFERRING PROVIDER: Bedelia Person, MD   REFERRING DIAG: Dorsalgia, unspecified   Rationale for Evaluation and Treatment: Rehabilitation  THERAPY DIAG:  Other low back pain  ONSET DATE: June 2024  SUBJECTIVE:                                                                                                                                                                                           SUBJECTIVE STATEMENT:  Patient reports that she is hurting a little bit today. She was able to help her son move yesterday and did not have any problems with it, but she was careful.    PERTINENT HISTORY:  Hypertension, osteoarthritis, and ADD  PAIN:  Are you having pain? Yes: NPRS scale: 4/10 Pain location: low back and both hip  Pain description: constant ache  Aggravating factors: bending over, lifting her legs (to put her pants on)  Relieving factors: medication  PRECAUTIONS: None  RED FLAGS: None   WEIGHT BEARING RESTRICTIONS: No  FALLS:  Has patient fallen in last 6 months? No  LIVING ENVIRONMENT: Lives with: lives with their spouse Lives in: House/apartment Stairs: Yes: External: 2 steps; none; step to pattern (due to knee) Has following equipment at home: None  OCCUPATION: Care guide; primarily sitting, works from home  PLOF: Independent  PATIENT GOALS: reduced pain, be able to play with her grandchildren, and return to her activities as walking, pickleball, etc.   NEXT MD VISIT: none  OBJECTIVE: all objective measures were assessed at his initial evaluation on 08/26/23 unless otherwise noted  PATIENT SURVEYS:  FOTO 69.69 on 09/10/23  SCREENING FOR RED FLAGS: Bowel or bladder incontinence: No Spinal tumors: No Cauda equina syndrome: No Compression fracture: No Abdominal aneurysm: No  COGNITION: Overall cognitive status: Within functional limits for tasks assessed     SENSATION: Patient reports no numbness or tingling  PALPATION: No significant tenderness to palpation  JOINT MOBILITY:  Lumbar: mild hypomobility and painful   LUMBAR ROM:   AROM eval  Flexion 80; low back pain   Extension 18; low back pain   Right lateral flexion 25% limited; "good hurt"   Left lateral flexion 25% limited: "good hurt"  Right rotation 25% limited; low back pain   Left rotation 25% limited; low back pain   (Blank rows = not tested)  LOWER EXTREMITY ROM: WFL for activities assessed   LOWER EXTREMITY MMT:    MMT Right eval Left eval  Hip flexion 4-/5 4-/5  Hip extension    Hip abduction    Hip adduction    Hip internal rotation     Hip external rotation  Knee flexion 4+/5 5/5  Knee extension 5/5 5/5  Ankle dorsiflexion 4+/5 4+/5  Ankle plantarflexion    Ankle inversion    Ankle eversion     (Blank rows = not tested)  LUMBAR SPECIAL TESTS:  Straight leg raise test: Negative  GAIT: Assistive device utilized: None Level of assistance: Complete Independence  TODAY'S TREATMENT:                                                                                                                              DATE:                                     09/10/23 EXERCISE LOG  Exercise Repetitions and Resistance Comments  Nustep  L4 x 15 minutes   L TFL stretch  3 x 30 seconds   Resisted side stepping  Green t-band @ ankles x 2 minutes    Monster walk  Green t-band x 2.5 minutes  Forward and backward   Lifting floor to waist  Up to 25#    Cybex knee extension 30# x 2 minutes    Marching on BOSU (ball up)  20 reps    Step up on BOSU with cone tap 3 minutes     Blank cell = exercise not performed today  Manual Therapy Soft Tissue Mobilization: left piriformis, for reduced pain and tone Manual Traction: LLE long axis distraction, grade I-IV                                     09/08/23 EXERCISE LOG  Exercise Repetitions and Resistance Comments  Nustep  L4 x 13 minutes    Piriformis stretch  3 x 30 seconds each BLE   Figure 4 stretch  3 x 30 seconds each Alternating LE  Thomas stretch  3 x 30 seconds each  Alternating LE  Wood chops  25 reps each    Wall squat  2 minutes    Ball roll out  3 minutes    Marching on BOSU (ball up)  25 reps each    BOSU circles 2 minutes CW and CCW    Blank cell = exercise not performed today                                    09/01/23 EXERCISE LOG  Exercise Repetitions and Resistance Comments  Nustep  L4 x 7 minutes    Rocker board  4 minutes    Resisted pull down  Blue XTS x 3 minutes    Bridge  2 minutes   Double knee to chest  3 minutes    SLR 2  x 20 reps each    Supine hip  ADD  isometric 3 minutes w/ 5 second hold    Lower trunk rotation 2.5 minutes   Standing hip extension  20 reps each    Tandem on foam  3 x 30 seconds each  Intermittent UE support    Blank cell = exercise not performed today   PATIENT EDUCATION:  Education details: returning to her previous exercise program safely Person educated: Patient Education method: Explanation Education comprehension: verbalized understanding  HOME EXERCISE PROGRAM: [id number]  ASSESSMENT:  CLINICAL IMPRESSION: Patient was introduced to multiple new interventions such as lifting from the floor to her waist to simulate picking up boxes to help her son move. She required minimal cueing with this intervention for proper body mechanics to avoid aggravating her familiar symptoms. Manual therapy focused on soft tissue mobilization to her left piriformis and left lower extremity long axis distraction for reduced pain with good effectiveness. She reported feeling better upon the conclusion of treatment. She continues to require skilled physical therapy to address her remaining impairments to return to her prior level of function.   OBJECTIVE IMPAIRMENTS: decreased activity tolerance, decreased mobility, decreased ROM, decreased strength, hypomobility, and pain.   ACTIVITY LIMITATIONS: carrying, lifting, bending, sleeping, dressing, and locomotion level  PARTICIPATION LIMITATIONS: shopping, community activity, and yard work  PERSONAL FACTORS: Time since onset of injury/illness/exacerbation and 3+ comorbidities: Hypertension, osteoarthritis, and ADD  are also affecting patient's functional outcome.   REHAB POTENTIAL: Good  CLINICAL DECISION MAKING: Evolving/moderate complexity  EVALUATION COMPLEXITY: Moderate   GOALS: Goals reviewed with patient? Yes  SHORT TERM GOALS: Target date: 09/16/23  Patient will be independent with her initial HEP. Baseline: Goal status: INITIAL  2.  Patient will be able to complete  her daily activities without her familiar pain exceeding 8/10. Baseline:  Goal status: INITIAL  LONG TERM GOALS: Target date: 10/07/23  Patient will be independent with her advanced HEP. Baseline:  Goal status: INITIAL  2.  Patient will be able to complete her daily activities without her familiar pain exceeding 6/10. Baseline:  Goal status: INITIAL  3.  Patient will report being able to play with her grandchildren without being limited by her familiar pain. Baseline:  Goal status: INITIAL  4.  Patient will be able to demonstrate proper lifting mechanics for a reduced risk of reinjury. Baseline:  Goal status: INITIAL  PLAN:  PT FREQUENCY: 2x/week  PT DURATION: 6 weeks  PLANNED INTERVENTIONS: Therapeutic exercises, Therapeutic activity, Neuromuscular re-education, Balance training, Patient/Family education, Self Care, Joint mobilization, Stair training, Electrical stimulation, Spinal manipulation, Spinal mobilization, Cryotherapy, Moist heat, Traction, Manual therapy, and Re-evaluation.  PLAN FOR NEXT SESSION: NuStep/recumbent bike, lumbar and lower extremity strengthening, review and modify HEP as needed, double knee-to-chest, and modalities as needed   Granville Lewis, PT 09/10/2023, 12:34 PM

## 2023-09-13 ENCOUNTER — Ambulatory Visit: Payer: Commercial Managed Care - PPO

## 2023-09-13 DIAGNOSIS — M5459 Other low back pain: Secondary | ICD-10-CM | POA: Diagnosis not present

## 2023-09-13 NOTE — Therapy (Signed)
OUTPATIENT PHYSICAL THERAPY THORACOLUMBAR TREATMENT   Patient Name: [name] MRN: [id number] DOB:[birth year], 52 y.o., female Today's Date: 09/13/2023  END OF SESSION:  PT End of Session - 09/13/23 0804     Visit Number 6    Number of Visits 12    Date for PT Re-Evaluation 11/12/23    PT Start Time 0800    PT Stop Time 0842    PT Time Calculation (min) 42 min    Activity Tolerance Patient tolerated treatment well    Behavior During Therapy [facility] for tasks assessed/performed                  Past Medical History:  Diagnosis Date   Abnormal uterine bleeding (AUB) 10/11/2015   ADD (attention deficit disorder)    Arthritis    oa   COVID feb 16th or feb 17th   01-29-21 or 2-17-2022rapid home test cold symptoms x 4 days all symptoms resolved   COVID 04/04/2021   epic, asymptomatic   Elevated LFTs    Elevated TSH since jan 2022   thryoid med adjusted see dr Everardo All 05-02-2021   Fatty liver    no problems in several yrs as of 04-02-21 per pt   GERD (gastroesophageal reflux disease)    Heavy periods    Hepatic steatosis    Hypertension    Hypothyroidism    Internal hemorrhoids    Schatzki's ring    Sleep apnea    cpap   Thyroid nodule    US done 2013 sees dr Revonda Standard may 20th 2022 for not sure which side no swallowing problems   Uterine fibroid    Vitamin D deficiency    Past Surgical History:  Procedure Laterality Date   CYSTOSCOPY  06/10/2021   Procedure: CYSTOSCOPY;  Surgeon: Genia Del, MD;  Location: [facility] [place];  Service: Gynecology;;   GASTRIC ROUX-EN-Y N/A 04/21/2022   Procedure: LAPAROSCOPIC ROUX-EN-Y GASTRIC BYPASS WITH UPPER ENDOSCOPY SLIDING HIATAL HERNIA REPAIR;  Surgeon: Gaynelle Adu, MD;  Location: [facility];  Service: General;  Laterality: N/A;   JOINT REPLACEMENT     ROBOTIC ASSISTED TOTAL HYSTERECTOMY WITH BILATERAL SALPINGO OOPHERECTOMY Bilateral 06/10/2021   Procedure: XI ROBOTIC ASSISTED TOTAL LAPAROSCOPIC HYSTERECTOMY  WITH BILATERAL SALPINGECTOMY, REPAIR OF LEFT SMALL LABIA MINORA TEAR;  Surgeon: Genia Del, MD;  Location: [facility] [place];  Service: Gynecology;  Laterality: Bilateral;   tooth implant  2016   TOTAL KNEE ARTHROPLASTY Left 12/27/2018   Procedure: LEFT TOTAL KNEE ARTHROPLASTY;  Surgeon: Kathryne Hitch, MD;  Location: [facility];  Service: Orthopedics;  Laterality: Left;   TUBAL LIGATION  ~2000   WISDOM TOOTH EXTRACTION  age 61   Patient Active Problem List   Diagnosis Date Noted   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   Morbid obesity (HCC) 06/23/2021   Postoperative state 06/10/2021   Preop cardiovascular exam 05/27/2021   NSVT (nonsustained ventricular tachycardia) (HCC) 05/27/2021   OSA (obstructive sleep apnea) 04/18/2021   Multinodular goiter 04/11/2021   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/27/2018   Unilateral primary osteoarthritis, left knee 11/01/2018   Primary osteoarthritis of left knee 11/15/2017   Dense breast tissue on mammogram 10/21/2016   HLD (hyperlipidemia) 10/21/2016   LVH (left ventricular hypertrophy) 10/21/2016   Mitral regurgitation 10/21/2016   Abnormal uterine bleeding (AUB) 10/11/2015   Hypertension 10/01/2015   Menorrhagia with regular cycle 06/26/2014   ADD (attention deficit disorder) 02/18/2012   Hypothyroidism 12/30/2011    PCP: Carylon Perches, MD  REFERRING PROVIDER: Bedelia Person, MD   REFERRING DIAG: Dorsalgia, unspecified   Rationale for Evaluation and Treatment: Rehabilitation  THERAPY DIAG:  Other low back pain  ONSET DATE: June 2024  SUBJECTIVE:                                                                                                                                                                                           SUBJECTIVE STATEMENT:  Patient reports that she feels like a new woman today. However, she was really hurting yesterday from being on her feet 10 hours helping her children move. She  notes that she had to just stop and do her HEP which really helped.   PERTINENT HISTORY:  Hypertension, osteoarthritis, and ADD  PAIN:  Are you having pain? Yes: NPRS scale: 0/10 Pain location: low back and both hip  Pain description: constant ache  Aggravating factors: bending over, lifting her legs (to put her pants on)  Relieving factors: medication  PRECAUTIONS: None  RED FLAGS: None   WEIGHT BEARING RESTRICTIONS: No  FALLS:  Has patient fallen in last 6 months? No  LIVING ENVIRONMENT: Lives with: lives with their spouse Lives in: House/apartment Stairs: Yes: External: 2 steps; none; step to pattern (due to knee) Has following equipment at home: None  OCCUPATION: Care guide; primarily sitting, works from home  PLOF: Independent  PATIENT GOALS: reduced pain, be able to play with her grandchildren, and return to her activities as walking, pickleball, etc.   NEXT MD VISIT: none  OBJECTIVE: all objective measures were assessed at his initial evaluation on 08/26/23 unless otherwise noted  PATIENT SURVEYS:  FOTO 69.69 on 09/10/23  SCREENING FOR RED FLAGS: Bowel or bladder incontinence: No Spinal tumors: No Cauda equina syndrome: No Compression fracture: No Abdominal aneurysm: No  COGNITION: Overall cognitive status: Within functional limits for tasks assessed     SENSATION: Patient reports no numbness or tingling  PALPATION: No significant tenderness to palpation  JOINT MOBILITY:  Lumbar: mild hypomobility and painful   LUMBAR ROM:   AROM eval  Flexion 80; low back pain   Extension 18; low back pain   Right lateral flexion 25% limited; "good hurt"   Left lateral flexion 25% limited: "good hurt"  Right rotation 25% limited; low back pain   Left rotation 25% limited; low back pain   (Blank rows = not tested)  LOWER EXTREMITY ROM: WFL for activities assessed   LOWER EXTREMITY MMT:    MMT Right eval Left eval  Hip flexion 4-/5 4-/5  Hip extension     Hip abduction    Hip adduction  Hip internal rotation    Hip external rotation    Knee flexion 4+/5 5/5  Knee extension 5/5 5/5  Ankle dorsiflexion 4+/5 4+/5  Ankle plantarflexion    Ankle inversion    Ankle eversion     (Blank rows = not tested)  LUMBAR SPECIAL TESTS:  Straight leg raise test: Negative  GAIT: Assistive device utilized: None Level of assistance: Complete Independence  TODAY'S TREATMENT:                                                                                                                              DATE:                                     09/13/23 EXERCISE LOG  Exercise Repetitions and Resistance Comments  Nustep  L5 x 15 minutes   Cybex knee extension  30# x 3 x 10 reps    Cybex knee flexion  50# x 3 x 10 reps    BOSU squat  20 reps  Fingertip support  SLS on foam  3 x 30 seconds each  Intermittent UE support   Multifidus rotation  Green t-band x 25 reps each     Blank cell = exercise not performed today  Manual Therapy Soft Tissue Mobilization: left piriformis, for reduced pain and tone                                     09/10/23 EXERCISE LOG  Exercise Repetitions and Resistance Comments  Nustep  L4 x 15 minutes   L TFL stretch  3 x 30 seconds   Resisted side stepping  Green t-band @ ankles x 2 minutes    Monster walk  Green t-band x 2.5 minutes  Forward and backward   Lifting floor to waist  Up to 25#    Cybex knee extension 30# x 2 minutes    Marching on BOSU (ball up)  20 reps    Step up on BOSU with cone tap 3 minutes     Blank cell = exercise not performed today  Manual Therapy Soft Tissue Mobilization: left piriformis, for reduced pain and tone Manual Traction: LLE long axis distraction, grade I-IV                                     09/08/23 EXERCISE LOG  Exercise Repetitions and Resistance Comments  Nustep  L4 x 13 minutes    Piriformis stretch  3 x 30 seconds each BLE   Figure 4 stretch  3 x 30 seconds each  Alternating LE  Thomas stretch  3 x 30 seconds each  Alternating LE  Wood chops  25 reps each  Wall squat  2 minutes    Ball roll out  3 minutes    Marching on BOSU (ball up)  25 reps each    BOSU circles 2 minutes CW and CCW    Blank cell = exercise not performed today   PATIENT EDUCATION:  Education details: returning to her previous exercise program safely Person educated: Patient Education method: Explanation Education comprehension: verbalized understanding  HOME EXERCISE PROGRAM: [id number]  ASSESSMENT:  CLINICAL IMPRESSION: Patient was progressed with multiple new and familiar interventions for improved lumbar stability and muscular strength with moderate difficulty. She required minimal cueing with today's new interventions for proper exercise performance to facilitate posterior chain engagement. Manual therapy focused on soft tissue mobilization to her left piriformis for reduced pain and tone with good effectiveness. She reported feeling better upon the conclusion of treatment. She continues to require skilled physical therapy to address her remaining impairments to return to her prior level of function.   OBJECTIVE IMPAIRMENTS: decreased activity tolerance, decreased mobility, decreased ROM, decreased strength, hypomobility, and pain.   ACTIVITY LIMITATIONS: carrying, lifting, bending, sleeping, dressing, and locomotion level  PARTICIPATION LIMITATIONS: shopping, community activity, and yard work  PERSONAL FACTORS: Time since onset of injury/illness/exacerbation and 3+ comorbidities: Hypertension, osteoarthritis, and ADD  are also affecting patient's functional outcome.   REHAB POTENTIAL: Good  CLINICAL DECISION MAKING: Evolving/moderate complexity  EVALUATION COMPLEXITY: Moderate   GOALS: Goals reviewed with patient? Yes  SHORT TERM GOALS: Target date: 09/16/23  Patient will be independent with her initial HEP. Baseline: Goal status: INITIAL  2.  Patient will  be able to complete her daily activities without her familiar pain exceeding 8/10. Baseline:  Goal status: INITIAL  LONG TERM GOALS: Target date: 10/07/23  Patient will be independent with her advanced HEP. Baseline:  Goal status: INITIAL  2.  Patient will be able to complete her daily activities without her familiar pain exceeding 6/10. Baseline:  Goal status: INITIAL  3.  Patient will report being able to play with her grandchildren without being limited by her familiar pain. Baseline:  Goal status: INITIAL  4.  Patient will be able to demonstrate proper lifting mechanics for a reduced risk of reinjury. Baseline:  Goal status: INITIAL  PLAN:  PT FREQUENCY: 2x/week  PT DURATION: 6 weeks  PLANNED INTERVENTIONS: Therapeutic exercises, Therapeutic activity, Neuromuscular re-education, Balance training, Patient/Family education, Self Care, Joint mobilization, Stair training, Electrical stimulation, Spinal manipulation, Spinal mobilization, Cryotherapy, Moist heat, Traction, Manual therapy, and Re-evaluation.  PLAN FOR NEXT SESSION: NuStep/recumbent bike, lumbar and lower extremity strengthening, review and modify HEP as needed, double knee-to-chest, and modalities as needed   Granville Lewis, PT 09/13/2023, 8:46 AM

## 2023-09-14 ENCOUNTER — Other Ambulatory Visit (HOSPITAL_BASED_OUTPATIENT_CLINIC_OR_DEPARTMENT_OTHER): Payer: Self-pay

## 2023-09-14 ENCOUNTER — Other Ambulatory Visit: Payer: Self-pay

## 2023-09-14 MED ORDER — CYCLOBENZAPRINE HCL 10 MG PO TABS
10.0000 mg | ORAL_TABLET | Freq: Every day | ORAL | 3 refills | Status: DC
  Filled 2023-09-14: qty 90, 90d supply, fill #0
  Filled 2023-12-09: qty 90, 90d supply, fill #1
  Filled 2024-03-06: qty 90, 90d supply, fill #2
  Filled 2024-06-06: qty 90, 90d supply, fill #3

## 2023-09-14 MED ORDER — VALSARTAN-HYDROCHLOROTHIAZIDE 320-25 MG PO TABS
1.0000 | ORAL_TABLET | Freq: Every day | ORAL | 4 refills | Status: DC
  Filled 2023-09-14: qty 90, 90d supply, fill #0
  Filled 2023-12-09: qty 90, 90d supply, fill #1
  Filled 2024-05-02: qty 90, 90d supply, fill #2

## 2023-10-12 ENCOUNTER — Ambulatory Visit (INDEPENDENT_AMBULATORY_CARE_PROVIDER_SITE_OTHER): Payer: Commercial Managed Care - PPO | Admitting: Nurse Practitioner

## 2023-10-12 ENCOUNTER — Other Ambulatory Visit (HOSPITAL_BASED_OUTPATIENT_CLINIC_OR_DEPARTMENT_OTHER): Payer: Self-pay

## 2023-10-12 ENCOUNTER — Encounter: Payer: Self-pay | Admitting: Nurse Practitioner

## 2023-10-12 VITALS — BP 142/98 | HR 82 | Ht 66.0 in | Wt 176.0 lb

## 2023-10-12 DIAGNOSIS — B3731 Acute candidiasis of vulva and vagina: Secondary | ICD-10-CM

## 2023-10-12 DIAGNOSIS — Z79890 Hormone replacement therapy (postmenopausal): Secondary | ICD-10-CM

## 2023-10-12 DIAGNOSIS — Z01419 Encounter for gynecological examination (general) (routine) without abnormal findings: Secondary | ICD-10-CM

## 2023-10-12 MED ORDER — FLUCONAZOLE 150 MG PO TABS
150.0000 mg | ORAL_TABLET | ORAL | 0 refills | Status: DC
  Filled 2023-10-12: qty 2, 6d supply, fill #0

## 2023-10-12 MED ORDER — ESTRADIOL 0.075 MG/24HR TD PTTW
1.0000 | MEDICATED_PATCH | TRANSDERMAL | 3 refills | Status: DC
  Filled 2023-10-12: qty 24, 84d supply, fill #0
  Filled 2024-02-01: qty 24, 84d supply, fill #1
  Filled 2024-06-06: qty 24, 84d supply, fill #2

## 2023-10-12 NOTE — Progress Notes (Signed)
[name] 04-22-1971 [id number]   History:  52 y.o. [id number] presents for annual exam. S/P 2022 robotic TLH with bilateral salpingectomies for menorrhagia s/t fibroids. On ERT for hot flashes, night sweats, and mood changes. Normal pap and mammogram history. HTN managed by PCP, hypothyroidism managed by endocrinology. 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   Gynecologic History Patient's last menstrual period was 03/14/2021.   Contraception/Family planning: status post hysterectomy Sexually active: Yes  Health Maintenance Last Pap: 12/27/2019. Results were: Normal Last mammogram: 11/09/2022. Results were: Normal Last colonoscopy: 02/19/2022. Results were: Normal, 10-year recall Last Dexa: Not indicated  Past medical history, past surgical history, family history and social history were all reviewed and documented in the EPIC chart. Married. Works remote for [employer]. 2 sons, 1 stepdaughter. 3 grandchildren.   ROS:  A ROS was performed and pertinent positives and negatives are included.  Exam:  Vitals:   10/12/23 1605  BP: (!) 142/98  Pulse: 82  SpO2: 97%  Weight: 176 lb (79.8 kg)  Height: 5\' 6"  (1.676 m)    Body mass index is 28.41 kg/m.  General appearance:  Normal Thyroid:  Symmetrical, normal in size, without palpable masses or nodularity. Respiratory  Auscultation:  Clear without wheezing or rhonchi Cardiovascular  Auscultation:  Regular rate, without rubs, murmurs or gallops  Edema/varicosities:  Not grossly evident Abdominal  Soft,nontender, without masses, guarding or rebound.  Liver/spleen:  No organomegaly noted  Hernia:  None appreciated  Skin  Inspection:  Grossly normal Breasts: Examined lying and sitting.   Right: Without masses, retractions, nipple discharge or axillary adenopathy.   Left: Without masses, retractions, nipple discharge or axillary adenopathy. Pelvic: External genitalia:  Red rash with satellite lesions              Urethra:   normal appearing urethra with no masses, tenderness or lesions              Bartholins and Skenes: normal                 Vagina: normal appearing vagina with normal color and discharge, no lesions              Cervix: absent Bimanual Exam:  Uterus:  absent              Adnexa: no mass, fullness, tenderness              Rectovaginal: Deferred              Anus:  normal, no lesions  Patient informed chaperone available to be present for breast and pelvic exam. Patient has requested no chaperone to be present. Patient has been advised what will be completed during breast and pelvic exam.   Assessment/Plan:  52 y.o. [id number] for annual exam.   Well female exam with routine gynecological exam - Education provided on SBEs, importance of preventative screenings, current guidelines, high calcium diet, regular exercise, and multivitamin daily. Labs with PCP.   Hormone replacement therapy - Plan: estradiol (VIVELLE-DOT) 0.075 MG/24HR twice weekly. Good management.   Vulvar candidiasis - Plan: fluconazole (DIFLUCAN) 150 MG tablet today and repeat in 3 days. If no improvement will try topical steroid ointment.   Screening for cervical cancer - Normal Pap history.  No longer screening per guidelines.    Screening for breast cancer - Normal mammogram history.  Continue annual screenings.  Normal breast exam today.   Screening for colon cancer - 04/2022 colonoscopy. Will repeat at 10-year  interval per GI's recommendation.   Return in about 1 year (around 10/11/2024) for Annual.    Olivia Mackie DNP, 4:25 PM 10/12/2023

## 2023-10-13 ENCOUNTER — Other Ambulatory Visit (HOSPITAL_COMMUNITY): Payer: Commercial Managed Care - PPO | Attending: Oncology

## 2023-11-24 ENCOUNTER — Encounter (HOSPITAL_COMMUNITY): Payer: Self-pay | Admitting: *Deleted

## 2023-12-07 ENCOUNTER — Telehealth: Payer: Commercial Managed Care - PPO

## 2023-12-09 ENCOUNTER — Other Ambulatory Visit (HOSPITAL_BASED_OUTPATIENT_CLINIC_OR_DEPARTMENT_OTHER): Payer: Self-pay

## 2023-12-09 ENCOUNTER — Other Ambulatory Visit: Payer: Self-pay

## 2023-12-10 ENCOUNTER — Other Ambulatory Visit (HOSPITAL_BASED_OUTPATIENT_CLINIC_OR_DEPARTMENT_OTHER): Payer: Self-pay

## 2023-12-10 MED ORDER — METHOCARBAMOL 500 MG PO TABS
500.0000 mg | ORAL_TABLET | Freq: Every day | ORAL | 3 refills | Status: AC | PRN
  Filled 2023-12-10: qty 30, 30d supply, fill #0

## 2024-02-01 ENCOUNTER — Other Ambulatory Visit (HOSPITAL_BASED_OUTPATIENT_CLINIC_OR_DEPARTMENT_OTHER): Payer: Self-pay

## 2024-02-04 ENCOUNTER — Telehealth: Payer: 59 | Admitting: Family Medicine

## 2024-02-04 ENCOUNTER — Other Ambulatory Visit (HOSPITAL_BASED_OUTPATIENT_CLINIC_OR_DEPARTMENT_OTHER): Payer: Self-pay

## 2024-02-04 DIAGNOSIS — J019 Acute sinusitis, unspecified: Secondary | ICD-10-CM

## 2024-02-04 DIAGNOSIS — B9689 Other specified bacterial agents as the cause of diseases classified elsewhere: Secondary | ICD-10-CM | POA: Diagnosis not present

## 2024-02-04 MED ORDER — AMOXICILLIN-POT CLAVULANATE 875-125 MG PO TABS
1.0000 | ORAL_TABLET | Freq: Two times a day (BID) | ORAL | 0 refills | Status: AC
  Filled 2024-02-04: qty 20, 10d supply, fill #0

## 2024-02-04 NOTE — Progress Notes (Signed)

## 2024-02-09 ENCOUNTER — Telehealth: Payer: 59 | Admitting: Physician Assistant

## 2024-02-09 ENCOUNTER — Other Ambulatory Visit (HOSPITAL_BASED_OUTPATIENT_CLINIC_OR_DEPARTMENT_OTHER): Payer: Self-pay

## 2024-02-09 DIAGNOSIS — B9689 Other specified bacterial agents as the cause of diseases classified elsewhere: Secondary | ICD-10-CM

## 2024-02-09 MED ORDER — PREDNISONE 10 MG (21) PO TBPK
ORAL_TABLET | ORAL | 0 refills | Status: AC
  Filled 2024-02-09: qty 21, 6d supply, fill #0

## 2024-02-09 NOTE — Progress Notes (Signed)
 E-Visit for Sinus Problems  We are sorry that you are not feeling well.  Here is how we plan to help!  Based on what you have shared with me it looks like you have sinusitis.  Sinusitis is inflammation and infection in the sinus cavities of the head.  Based on your presentation I believe you most likely have Acute Bacterial Sinusitis.  This is an infection caused by bacteria and is treated with antibiotics. Continue previously prescribed medications until completed.  I am prescribing a 6 day prednisone taper.   Day 1: Take 2 tablets with breakfast, 1 tablet with lunch, 1 tablet with supper, and 2 tablets at bedtime Day 2: Take 2 tablets with breakfast, 1 tablet with lunch, 1 tablet with supper, and 1 tablet at bedtime Day 3: Take 1 tablet at breakfast, 1 tablet with lunch, 1 tablet with supper, and 1 tablet at bedtime Day 4: Take 1 tablet with breakfast, 1 tablet with supper and 1 tablet at bedtime Day 5: Take 1 tablet with breakfast and 1 tablet with supper or bedtime Day 6: Take 1 tablet with breakfast   You may use an oral decongestant such as Mucinex D or if you have glaucoma or high blood pressure use plain Mucinex. Saline nasal spray help and can safely be used as often as needed for congestion.  If you develop worsening sinus pain, fever or notice severe headache and vision changes, or if symptoms are not better after completion of antibiotic, please schedule an appointment with a health care provider.    Sinus infections are not as easily transmitted as other respiratory infection, however we still recommend that you avoid close contact with loved ones, especially the very young and elderly.  Remember to wash your hands thoroughly throughout the day as this is the number one way to prevent the spread of infection!  Home Care: Only take medications as instructed by your medical team. Complete the entire course of an antibiotic. Do not take these medications with alcohol. A steam or  ultrasonic humidifier can help congestion.  You can place a towel over your head and breathe in the steam from hot water coming from a faucet. Avoid close contacts especially the very young and the elderly. Cover your mouth when you cough or sneeze. Always remember to wash your hands.  Get Help Right Away If: You develop worsening fever or sinus pain. You develop a severe head ache or visual changes. Your symptoms persist after you have completed your treatment plan.  Make sure you Understand these instructions. Will watch your condition. Will get help right away if you are not doing well or get worse.  Thank you for choosing an e-visit.  Your e-visit answers were reviewed by a board certified advanced clinical practitioner to complete your personal care plan. Depending upon the condition, your plan could have included both over the counter or prescription medications.  Please review your pharmacy choice. Make sure the pharmacy is open so you can pick up prescription now. If there is a problem, you may contact your provider through [employer] and have the prescription routed to another pharmacy.  Your safety is important to [place]. If you have drug allergies check your prescription carefully.   For the next 24 hours you can use MyChart to ask questions about today's visit, request a non-urgent call back, or ask for a work or school excuse. You will get an email in the next two days asking about your experience. I hope  that your e-visit has been valuable and will speed your recovery.   I have spent 5 minutes in review of e-visit questionnaire, review and updating patient chart, medical decision making and response to patient.   Margaretann Loveless, PA-C

## 2024-04-11 ENCOUNTER — Other Ambulatory Visit (HOSPITAL_BASED_OUTPATIENT_CLINIC_OR_DEPARTMENT_OTHER): Payer: Self-pay | Admitting: Internal Medicine

## 2024-04-11 DIAGNOSIS — Z1231 Encounter for screening mammogram for malignant neoplasm of breast: Secondary | ICD-10-CM

## 2024-04-17 DIAGNOSIS — H524 Presbyopia: Secondary | ICD-10-CM | POA: Diagnosis not present

## 2024-04-17 DIAGNOSIS — I10 Essential (primary) hypertension: Secondary | ICD-10-CM | POA: Diagnosis not present

## 2024-04-18 ENCOUNTER — Inpatient Hospital Stay (HOSPITAL_BASED_OUTPATIENT_CLINIC_OR_DEPARTMENT_OTHER): Admission: RE | Admit: 2024-04-18 | Source: Ambulatory Visit | Admitting: Radiology

## 2024-04-18 DIAGNOSIS — Z1231 Encounter for screening mammogram for malignant neoplasm of breast: Secondary | ICD-10-CM

## 2024-04-20 ENCOUNTER — Other Ambulatory Visit (HOSPITAL_BASED_OUTPATIENT_CLINIC_OR_DEPARTMENT_OTHER): Payer: Self-pay

## 2024-04-20 MED ORDER — CHLORHEXIDINE GLUCONATE 0.12 % MT SOLN
OROMUCOSAL | 0 refills | Status: AC
  Filled 2024-04-20: qty 473, 30d supply, fill #0

## 2024-04-20 MED ORDER — AMOXICILLIN 500 MG PO CAPS
ORAL_CAPSULE | ORAL | 1 refills | Status: AC
  Filled 2024-04-20: qty 21, 7d supply, fill #0

## 2024-05-02 ENCOUNTER — Other Ambulatory Visit: Payer: Self-pay

## 2024-05-02 ENCOUNTER — Encounter: Payer: Self-pay | Admitting: Nurse Practitioner

## 2024-05-02 ENCOUNTER — Other Ambulatory Visit (HOSPITAL_BASED_OUTPATIENT_CLINIC_OR_DEPARTMENT_OTHER): Payer: Self-pay

## 2024-05-02 ENCOUNTER — Other Ambulatory Visit: Payer: Self-pay | Admitting: Nurse Practitioner

## 2024-05-02 DIAGNOSIS — B3731 Acute candidiasis of vulva and vagina: Secondary | ICD-10-CM

## 2024-05-02 MED ORDER — FLUCONAZOLE 150 MG PO TABS
150.0000 mg | ORAL_TABLET | ORAL | 0 refills | Status: AC
  Filled 2024-05-02: qty 2, 6d supply, fill #0

## 2024-05-02 MED ORDER — LEVOTHYROXINE SODIUM 150 MCG PO TABS
150.0000 ug | ORAL_TABLET | Freq: Every day | ORAL | 12 refills | Status: AC
  Filled 2024-05-02: qty 60, 60d supply, fill #0
  Filled 2024-08-14: qty 60, 60d supply, fill #1
  Filled 2024-12-11: qty 60, 60d supply, fill #2

## 2024-05-02 NOTE — Telephone Encounter (Signed)
 Last AEX 10/12/23  Routing to Tiffany to review and advise

## 2024-06-06 ENCOUNTER — Other Ambulatory Visit: Payer: Self-pay

## 2024-08-14 ENCOUNTER — Other Ambulatory Visit (HOSPITAL_BASED_OUTPATIENT_CLINIC_OR_DEPARTMENT_OTHER): Payer: Self-pay

## 2024-08-15 ENCOUNTER — Other Ambulatory Visit (HOSPITAL_BASED_OUTPATIENT_CLINIC_OR_DEPARTMENT_OTHER): Payer: Self-pay

## 2024-08-15 ENCOUNTER — Other Ambulatory Visit: Payer: Self-pay

## 2024-08-15 MED ORDER — ESCITALOPRAM OXALATE 10 MG PO TABS
10.0000 mg | ORAL_TABLET | Freq: Every morning | ORAL | 0 refills | Status: DC
  Filled 2024-08-15: qty 90, 90d supply, fill #0

## 2024-09-01 ENCOUNTER — Other Ambulatory Visit (HOSPITAL_BASED_OUTPATIENT_CLINIC_OR_DEPARTMENT_OTHER): Payer: Self-pay

## 2024-09-01 MED ORDER — CYCLOBENZAPRINE HCL 10 MG PO TABS
10.0000 mg | ORAL_TABLET | Freq: Every day | ORAL | 0 refills | Status: DC | PRN
  Filled 2024-09-01: qty 90, 90d supply, fill #0

## 2024-10-05 ENCOUNTER — Other Ambulatory Visit: Payer: Self-pay | Admitting: Medical Genetics

## 2024-10-05 DIAGNOSIS — Z006 Encounter for examination for normal comparison and control in clinical research program: Secondary | ICD-10-CM

## 2024-11-07 ENCOUNTER — Other Ambulatory Visit (HOSPITAL_BASED_OUTPATIENT_CLINIC_OR_DEPARTMENT_OTHER): Payer: Self-pay

## 2024-11-07 MED ORDER — ESCITALOPRAM OXALATE 10 MG PO TABS
10.0000 mg | ORAL_TABLET | Freq: Every morning | ORAL | 0 refills | Status: AC
  Filled 2024-11-07: qty 90, 90d supply, fill #0

## 2024-11-07 MED ORDER — VALSARTAN-HYDROCHLOROTHIAZIDE 320-25 MG PO TABS
1.0000 | ORAL_TABLET | Freq: Every day | ORAL | 4 refills | Status: AC
  Filled 2024-11-07: qty 90, 90d supply, fill #0

## 2024-11-22 ENCOUNTER — Encounter (HOSPITAL_COMMUNITY): Payer: Self-pay | Admitting: *Deleted

## 2024-11-29 ENCOUNTER — Other Ambulatory Visit: Payer: Self-pay | Admitting: Nurse Practitioner

## 2024-11-29 DIAGNOSIS — Z79890 Hormone replacement therapy (postmenopausal): Secondary | ICD-10-CM

## 2024-11-29 NOTE — Telephone Encounter (Signed)
 Med refill request: estradiol  0.075 mg patch Last AEX: 10/12/23 Next AEX: none scheduled Last MMG (if hormonal med) 11/09/22 BI-RADS 1 negative Last dispensed: 06/08/2024 Refill denied.  Needs AEX. Message sent to front desk to schedule AEX.

## 2024-12-01 ENCOUNTER — Other Ambulatory Visit: Payer: Self-pay

## 2024-12-01 ENCOUNTER — Other Ambulatory Visit (HOSPITAL_BASED_OUTPATIENT_CLINIC_OR_DEPARTMENT_OTHER): Payer: Self-pay

## 2024-12-01 DIAGNOSIS — Z79890 Hormone replacement therapy (postmenopausal): Secondary | ICD-10-CM

## 2024-12-01 NOTE — Telephone Encounter (Signed)
 Med refill request:   estradiol  (DOTTI ) 0.075 MG/24HR  Start:  10/12/23 Disp:  24 patches Refills:  1 of 3 remaining  Last AEX:  10/12/23 Next AEX:  02/06/25 Last MMG (if hormonal med):  11/09/22 Refill authorized? Please Advise.

## 2024-12-04 ENCOUNTER — Other Ambulatory Visit: Payer: Self-pay

## 2024-12-04 ENCOUNTER — Other Ambulatory Visit (HOSPITAL_BASED_OUTPATIENT_CLINIC_OR_DEPARTMENT_OTHER): Payer: Self-pay

## 2024-12-04 MED ORDER — ESTRADIOL 0.075 MG/24HR TD PTTW
1.0000 | MEDICATED_PATCH | TRANSDERMAL | 3 refills | Status: AC
  Filled 2024-12-04: qty 24, 84d supply, fill #0

## 2024-12-05 ENCOUNTER — Other Ambulatory Visit (HOSPITAL_BASED_OUTPATIENT_CLINIC_OR_DEPARTMENT_OTHER): Payer: Self-pay

## 2024-12-11 ENCOUNTER — Other Ambulatory Visit (HOSPITAL_BASED_OUTPATIENT_CLINIC_OR_DEPARTMENT_OTHER): Payer: Self-pay

## 2024-12-11 ENCOUNTER — Other Ambulatory Visit: Payer: Self-pay

## 2024-12-12 ENCOUNTER — Other Ambulatory Visit (HOSPITAL_BASED_OUTPATIENT_CLINIC_OR_DEPARTMENT_OTHER): Payer: Self-pay

## 2024-12-12 MED ORDER — CYCLOBENZAPRINE HCL 10 MG PO TABS
10.0000 mg | ORAL_TABLET | Freq: Every day | ORAL | 0 refills | Status: AC | PRN
  Filled 2024-12-12: qty 90, 90d supply, fill #0

## 2025-02-06 ENCOUNTER — Ambulatory Visit: Admitting: Nurse Practitioner
